# Patient Record
Sex: Female | Born: 1970 | Race: White | Hispanic: No | Marital: Married
Health system: Midwestern US, Community
[De-identification: ages and names within clinical notes are randomized; demographics above are authoritative.]

## PROBLEM LIST (undated history)

## (undated) DIAGNOSIS — F329 Major depressive disorder, single episode, unspecified: Secondary | ICD-10-CM

## (undated) DIAGNOSIS — R011 Cardiac murmur, unspecified: Secondary | ICD-10-CM

## (undated) DIAGNOSIS — R569 Unspecified convulsions: Secondary | ICD-10-CM

## (undated) DIAGNOSIS — F32A Depression, unspecified: Secondary | ICD-10-CM

## (undated) HISTORY — PX: TUBAL LIGATION: SHX77

## (undated) HISTORY — PX: KNEE SURGERY: SHX244

---

## 2000-10-26 ENCOUNTER — Emergency Department (HOSPITAL_COMMUNITY): Admission: EM | Admit: 2000-10-26 | Discharge: 2000-10-26 | Payer: Self-pay | Admitting: *Deleted

## 2000-11-11 ENCOUNTER — Emergency Department (HOSPITAL_COMMUNITY): Admission: EM | Admit: 2000-11-11 | Discharge: 2000-11-11 | Payer: Self-pay | Admitting: *Deleted

## 2000-11-13 ENCOUNTER — Emergency Department (HOSPITAL_COMMUNITY): Admission: EM | Admit: 2000-11-13 | Discharge: 2000-11-13 | Payer: Self-pay | Admitting: *Deleted

## 2001-02-21 ENCOUNTER — Emergency Department (HOSPITAL_COMMUNITY): Admission: EM | Admit: 2001-02-21 | Discharge: 2001-02-21 | Payer: Self-pay | Admitting: Emergency Medicine

## 2001-03-20 ENCOUNTER — Emergency Department (HOSPITAL_COMMUNITY): Admission: EM | Admit: 2001-03-20 | Discharge: 2001-03-21 | Payer: Self-pay | Admitting: Emergency Medicine

## 2001-03-24 ENCOUNTER — Emergency Department (HOSPITAL_COMMUNITY): Admission: EM | Admit: 2001-03-24 | Discharge: 2001-03-24 | Payer: Self-pay | Admitting: Emergency Medicine

## 2001-06-12 ENCOUNTER — Emergency Department (HOSPITAL_COMMUNITY): Admission: EM | Admit: 2001-06-12 | Discharge: 2001-06-12 | Payer: Self-pay | Admitting: *Deleted

## 2001-07-17 ENCOUNTER — Emergency Department (HOSPITAL_COMMUNITY): Admission: EM | Admit: 2001-07-17 | Discharge: 2001-07-17 | Payer: Self-pay | Admitting: Emergency Medicine

## 2001-07-27 ENCOUNTER — Emergency Department (HOSPITAL_COMMUNITY): Admission: EM | Admit: 2001-07-27 | Discharge: 2001-07-28 | Payer: Self-pay | Admitting: *Deleted

## 2001-07-28 ENCOUNTER — Encounter: Payer: Self-pay | Admitting: Emergency Medicine

## 2001-07-29 ENCOUNTER — Emergency Department (HOSPITAL_COMMUNITY): Admission: EM | Admit: 2001-07-29 | Discharge: 2001-07-29 | Payer: Self-pay | Admitting: Emergency Medicine

## 2001-11-05 ENCOUNTER — Ambulatory Visit (HOSPITAL_BASED_OUTPATIENT_CLINIC_OR_DEPARTMENT_OTHER): Admission: RE | Admit: 2001-11-05 | Discharge: 2001-11-05 | Payer: Self-pay | Admitting: Oral Surgery

## 2001-12-16 ENCOUNTER — Emergency Department (HOSPITAL_COMMUNITY): Admission: EM | Admit: 2001-12-16 | Discharge: 2001-12-16 | Payer: Self-pay | Admitting: Emergency Medicine

## 2002-01-31 ENCOUNTER — Encounter: Payer: Self-pay | Admitting: Internal Medicine

## 2002-01-31 ENCOUNTER — Ambulatory Visit (HOSPITAL_COMMUNITY): Admission: RE | Admit: 2002-01-31 | Discharge: 2002-01-31 | Payer: Self-pay | Admitting: Internal Medicine

## 2002-02-21 ENCOUNTER — Encounter: Payer: Self-pay | Admitting: Orthopaedic Surgery

## 2002-02-21 ENCOUNTER — Ambulatory Visit (HOSPITAL_COMMUNITY): Admission: RE | Admit: 2002-02-21 | Discharge: 2002-02-21 | Payer: Self-pay | Admitting: Orthopaedic Surgery

## 2002-03-18 ENCOUNTER — Ambulatory Visit (HOSPITAL_COMMUNITY): Admission: RE | Admit: 2002-03-18 | Discharge: 2002-03-18 | Payer: Self-pay | Admitting: Orthopaedic Surgery

## 2002-09-16 ENCOUNTER — Inpatient Hospital Stay (HOSPITAL_COMMUNITY): Admission: EM | Admit: 2002-09-16 | Discharge: 2002-10-01 | Payer: Self-pay | Admitting: Psychiatry

## 2002-09-28 ENCOUNTER — Emergency Department (HOSPITAL_COMMUNITY): Admission: EM | Admit: 2002-09-28 | Discharge: 2002-09-28 | Payer: Self-pay | Admitting: Emergency Medicine

## 2002-09-28 ENCOUNTER — Encounter: Payer: Self-pay | Admitting: Emergency Medicine

## 2002-10-21 ENCOUNTER — Inpatient Hospital Stay (HOSPITAL_COMMUNITY): Admission: EM | Admit: 2002-10-21 | Discharge: 2002-10-24 | Payer: Self-pay | Admitting: Psychiatry

## 2003-09-24 ENCOUNTER — Emergency Department (HOSPITAL_COMMUNITY): Admission: AD | Admit: 2003-09-24 | Discharge: 2003-09-24 | Payer: Self-pay | Admitting: Emergency Medicine

## 2004-03-12 ENCOUNTER — Emergency Department (HOSPITAL_COMMUNITY): Admission: EM | Admit: 2004-03-12 | Discharge: 2004-03-12 | Payer: Self-pay | Admitting: Emergency Medicine

## 2004-07-15 ENCOUNTER — Emergency Department (HOSPITAL_COMMUNITY): Admission: EM | Admit: 2004-07-15 | Discharge: 2004-07-16 | Payer: Self-pay | Admitting: *Deleted

## 2004-09-09 ENCOUNTER — Emergency Department (HOSPITAL_COMMUNITY): Admission: EM | Admit: 2004-09-09 | Discharge: 2004-09-10 | Payer: Self-pay | Admitting: Emergency Medicine

## 2004-12-20 ENCOUNTER — Emergency Department (HOSPITAL_COMMUNITY): Admission: EM | Admit: 2004-12-20 | Discharge: 2004-12-20 | Payer: Self-pay | Admitting: Emergency Medicine

## 2005-03-21 ENCOUNTER — Emergency Department (HOSPITAL_COMMUNITY): Admission: EM | Admit: 2005-03-21 | Discharge: 2005-03-21 | Payer: Self-pay | Admitting: Emergency Medicine

## 2005-04-08 ENCOUNTER — Inpatient Hospital Stay (HOSPITAL_COMMUNITY): Admission: EM | Admit: 2005-04-08 | Discharge: 2005-04-14 | Payer: Self-pay | Admitting: Psychiatry

## 2005-04-08 ENCOUNTER — Emergency Department (HOSPITAL_COMMUNITY): Admission: EM | Admit: 2005-04-08 | Discharge: 2005-04-08 | Payer: Self-pay | Admitting: Emergency Medicine

## 2005-04-17 ENCOUNTER — Inpatient Hospital Stay (HOSPITAL_COMMUNITY): Admission: RE | Admit: 2005-04-17 | Discharge: 2005-04-19 | Payer: Self-pay | Admitting: Psychiatry

## 2005-04-18 ENCOUNTER — Ambulatory Visit: Payer: Self-pay | Admitting: Psychiatry

## 2005-07-05 ENCOUNTER — Emergency Department (HOSPITAL_COMMUNITY): Admission: EM | Admit: 2005-07-05 | Discharge: 2005-07-05 | Payer: Self-pay | Admitting: Emergency Medicine

## 2005-08-15 ENCOUNTER — Ambulatory Visit (HOSPITAL_COMMUNITY): Admission: RE | Admit: 2005-08-15 | Discharge: 2005-08-15 | Payer: Self-pay | Admitting: Orthopaedic Surgery

## 2005-08-15 ENCOUNTER — Observation Stay (HOSPITAL_COMMUNITY): Admission: EM | Admit: 2005-08-15 | Discharge: 2005-08-18 | Payer: Self-pay | Admitting: Emergency Medicine

## 2005-08-16 ENCOUNTER — Ambulatory Visit: Payer: Self-pay | Admitting: Gastroenterology

## 2005-09-01 ENCOUNTER — Emergency Department (HOSPITAL_COMMUNITY): Admission: EM | Admit: 2005-09-01 | Discharge: 2005-09-01 | Payer: Self-pay | Admitting: Emergency Medicine

## 2005-09-01 ENCOUNTER — Ambulatory Visit: Payer: Self-pay | Admitting: Family Medicine

## 2005-09-08 ENCOUNTER — Ambulatory Visit: Payer: Self-pay | Admitting: Family Medicine

## 2005-09-09 ENCOUNTER — Emergency Department: Payer: Self-pay | Admitting: Emergency Medicine

## 2005-09-17 ENCOUNTER — Emergency Department (HOSPITAL_COMMUNITY): Admission: EM | Admit: 2005-09-17 | Discharge: 2005-09-17 | Payer: Self-pay | Admitting: Emergency Medicine

## 2005-09-21 ENCOUNTER — Ambulatory Visit: Payer: Self-pay | Admitting: Family Medicine

## 2005-09-21 ENCOUNTER — Emergency Department (HOSPITAL_COMMUNITY): Admission: EM | Admit: 2005-09-21 | Discharge: 2005-09-21 | Payer: Self-pay | Admitting: Emergency Medicine

## 2005-10-04 ENCOUNTER — Encounter (INDEPENDENT_AMBULATORY_CARE_PROVIDER_SITE_OTHER): Payer: Self-pay | Admitting: Specialist

## 2005-10-04 ENCOUNTER — Ambulatory Visit (HOSPITAL_COMMUNITY): Admission: RE | Admit: 2005-10-04 | Discharge: 2005-10-04 | Payer: Self-pay | Admitting: Urology

## 2005-10-05 ENCOUNTER — Ambulatory Visit (HOSPITAL_COMMUNITY): Admission: RE | Admit: 2005-10-05 | Discharge: 2005-10-05 | Payer: Self-pay | Admitting: Orthopaedic Surgery

## 2005-10-05 ENCOUNTER — Encounter (INDEPENDENT_AMBULATORY_CARE_PROVIDER_SITE_OTHER): Payer: Self-pay | Admitting: Specialist

## 2006-04-11 ENCOUNTER — Observation Stay (HOSPITAL_COMMUNITY): Admission: EM | Admit: 2006-04-11 | Discharge: 2006-04-13 | Payer: Self-pay | Admitting: Emergency Medicine

## 2006-04-14 ENCOUNTER — Emergency Department (HOSPITAL_COMMUNITY): Admission: EM | Admit: 2006-04-14 | Discharge: 2006-04-14 | Payer: Self-pay | Admitting: Emergency Medicine

## 2006-04-14 ENCOUNTER — Ambulatory Visit: Payer: Self-pay | Admitting: Psychiatry

## 2006-04-14 ENCOUNTER — Inpatient Hospital Stay (HOSPITAL_COMMUNITY): Admission: EM | Admit: 2006-04-14 | Discharge: 2006-04-18 | Payer: Self-pay | Admitting: Psychiatry

## 2006-04-14 ENCOUNTER — Inpatient Hospital Stay (HOSPITAL_COMMUNITY): Admission: AD | Admit: 2006-04-14 | Discharge: 2006-04-14 | Payer: Self-pay | Admitting: Obstetrics & Gynecology

## 2006-05-09 ENCOUNTER — Emergency Department (HOSPITAL_COMMUNITY): Admission: EM | Admit: 2006-05-09 | Discharge: 2006-05-10 | Payer: Self-pay | Admitting: Emergency Medicine

## 2006-07-07 ENCOUNTER — Emergency Department (HOSPITAL_COMMUNITY): Admission: EM | Admit: 2006-07-07 | Discharge: 2006-07-07 | Payer: Self-pay | Admitting: Emergency Medicine

## 2006-07-11 ENCOUNTER — Ambulatory Visit: Payer: Self-pay | Admitting: Cardiology

## 2006-07-17 ENCOUNTER — Ambulatory Visit: Payer: Self-pay | Admitting: Psychiatry

## 2006-07-17 ENCOUNTER — Inpatient Hospital Stay (HOSPITAL_COMMUNITY): Admission: AD | Admit: 2006-07-17 | Discharge: 2006-07-20 | Payer: Self-pay | Admitting: Psychiatry

## 2006-08-21 ENCOUNTER — Inpatient Hospital Stay (HOSPITAL_COMMUNITY): Admission: AD | Admit: 2006-08-21 | Discharge: 2006-08-25 | Payer: Self-pay | Admitting: Psychiatry

## 2006-08-26 ENCOUNTER — Inpatient Hospital Stay (HOSPITAL_COMMUNITY): Admission: EM | Admit: 2006-08-26 | Discharge: 2006-08-29 | Payer: Self-pay | Admitting: Emergency Medicine

## 2006-08-26 ENCOUNTER — Ambulatory Visit: Payer: Self-pay | Admitting: Cardiology

## 2006-08-26 ENCOUNTER — Ambulatory Visit: Payer: Self-pay | Admitting: Internal Medicine

## 2006-08-27 ENCOUNTER — Encounter: Payer: Self-pay | Admitting: Cardiology

## 2006-08-29 ENCOUNTER — Inpatient Hospital Stay (HOSPITAL_COMMUNITY): Admission: RE | Admit: 2006-08-29 | Discharge: 2006-08-30 | Payer: Self-pay | Admitting: Psychiatry

## 2006-08-29 ENCOUNTER — Ambulatory Visit: Payer: Self-pay | Admitting: Psychiatry

## 2007-07-28 ENCOUNTER — Emergency Department (HOSPITAL_COMMUNITY): Admission: EM | Admit: 2007-07-28 | Discharge: 2007-07-29 | Payer: Self-pay | Admitting: Emergency Medicine

## 2007-10-10 ENCOUNTER — Emergency Department (HOSPITAL_COMMUNITY): Admission: EM | Admit: 2007-10-10 | Discharge: 2007-10-10 | Payer: Self-pay | Admitting: Emergency Medicine

## 2007-11-23 ENCOUNTER — Emergency Department (HOSPITAL_COMMUNITY): Admission: EM | Admit: 2007-11-23 | Discharge: 2007-11-24 | Payer: Self-pay | Admitting: Emergency Medicine

## 2007-11-24 ENCOUNTER — Emergency Department (HOSPITAL_COMMUNITY): Admission: EM | Admit: 2007-11-24 | Discharge: 2007-11-24 | Payer: Self-pay | Admitting: Emergency Medicine

## 2007-12-04 ENCOUNTER — Emergency Department (HOSPITAL_COMMUNITY): Admission: EM | Admit: 2007-12-04 | Discharge: 2007-12-04 | Payer: Self-pay | Admitting: Emergency Medicine

## 2007-12-19 ENCOUNTER — Emergency Department (HOSPITAL_COMMUNITY): Admission: EM | Admit: 2007-12-19 | Discharge: 2007-12-20 | Payer: Self-pay | Admitting: Emergency Medicine

## 2008-03-20 ENCOUNTER — Inpatient Hospital Stay (HOSPITAL_COMMUNITY): Admission: EM | Admit: 2008-03-20 | Discharge: 2008-03-27 | Payer: Self-pay | Admitting: Emergency Medicine

## 2009-02-20 IMAGING — CT CT CHEST W/ CM
1 series · 16 of 32 positions shown, 20 images · IV contrast (Omnipaque 300)
Comparison: Chest radiograph 08/28/2006.

CLINICAL DATA: Hemoptysis

CT CHEST WITH CONTRAST
TECHNIQUE: Multidetector CT imaging of the chest was performed
following the standard protocol during bolus administration of
intravenous contrast.
Contrast: Chest radiograph 08/28/2006

[Series 2: chestroutine 5.0 b40f · axial · 0.58mm/px · z∈[-416,-90]mm · 16 of 71 slices shown, 20 images]
[im 3/71  mediastinal]
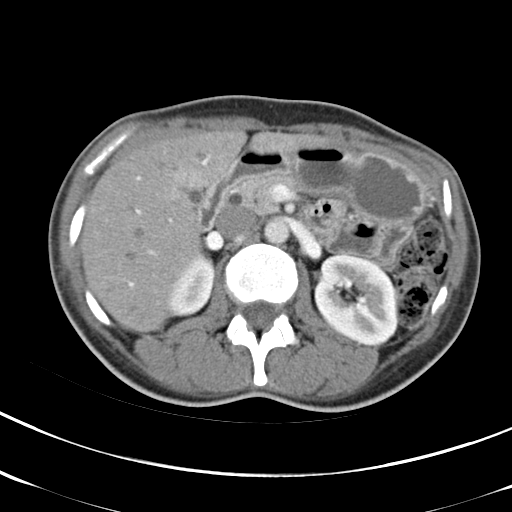
[im 3/71  lung]
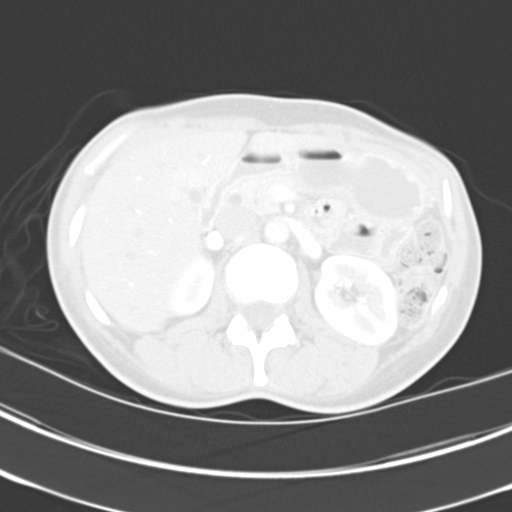
[im 8/71  lung]
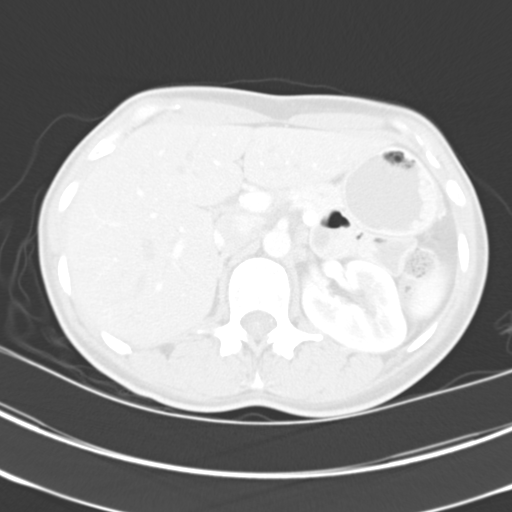
[im 13/71  lung]
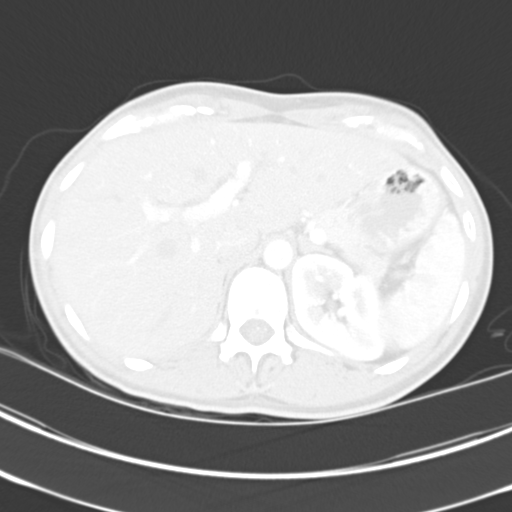
[im 16/71  lung]
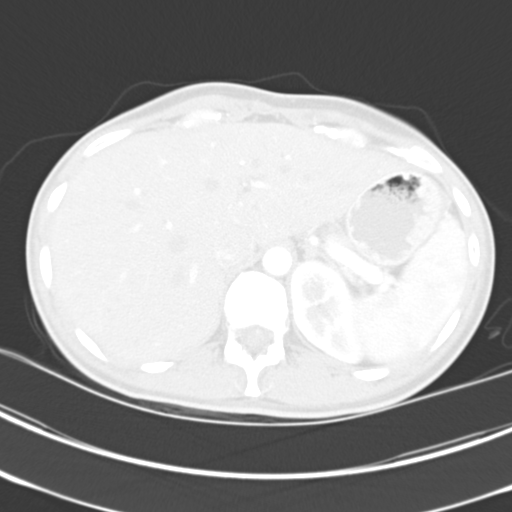
[im 21/71  mediastinal]
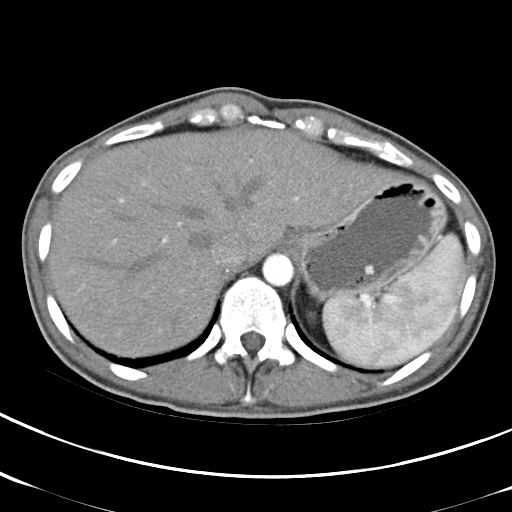
[im 21/71  lung]
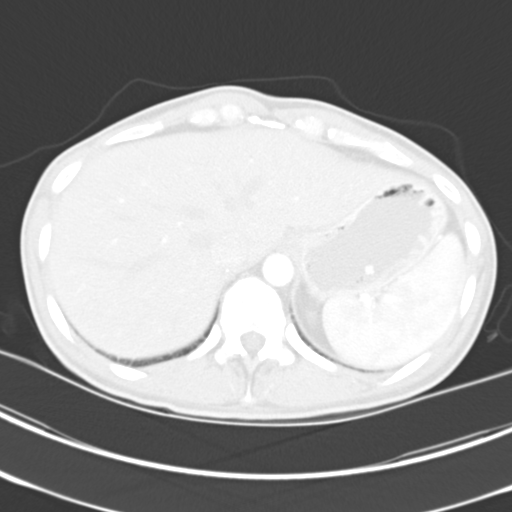
[im 26/71  lung]
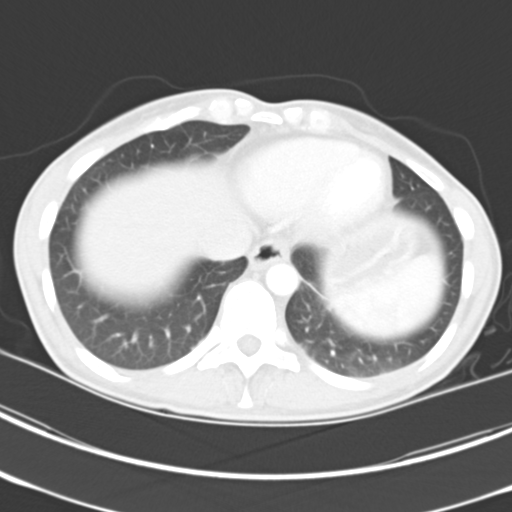
[im 32/71  lung]
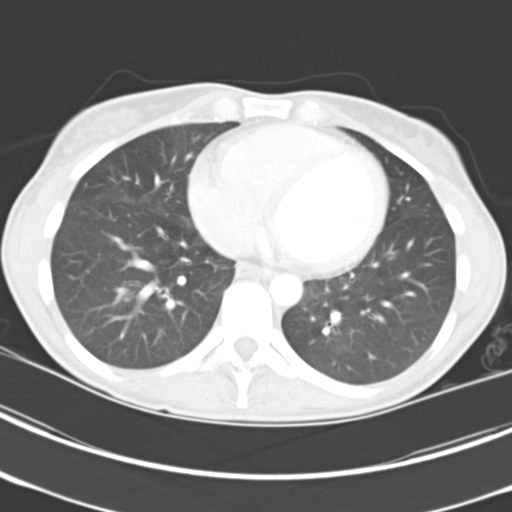
[im 37/71  lung]
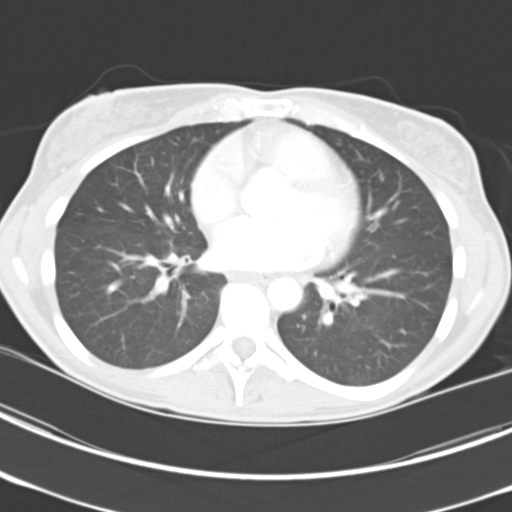
[im 38/71  mediastinal]
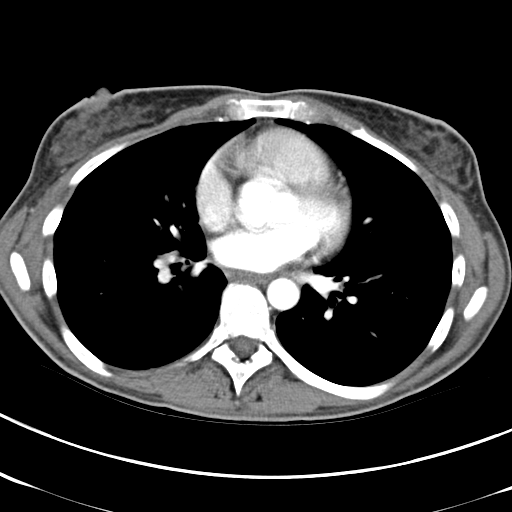
[im 38/71  lung]
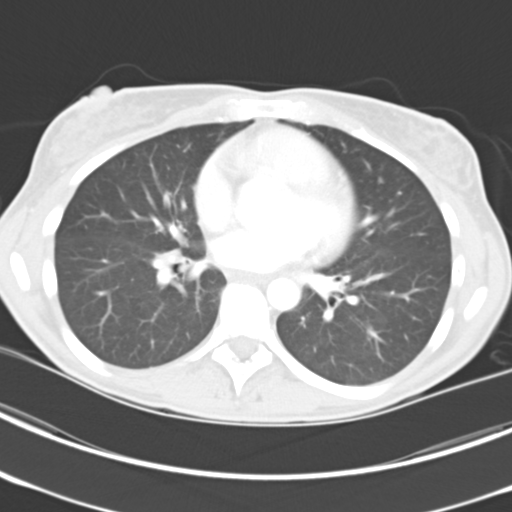
[im 42/71  lung]
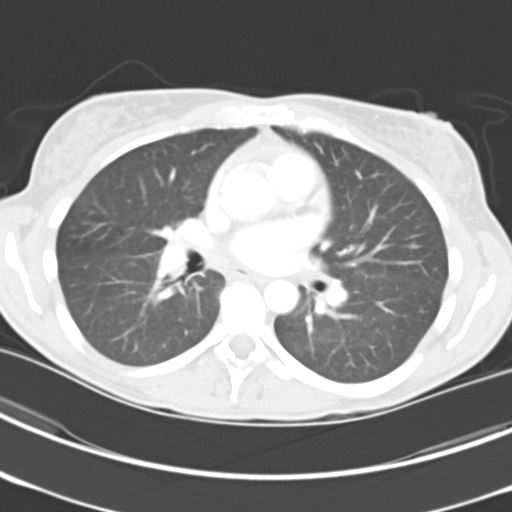
[im 45/71  lung]
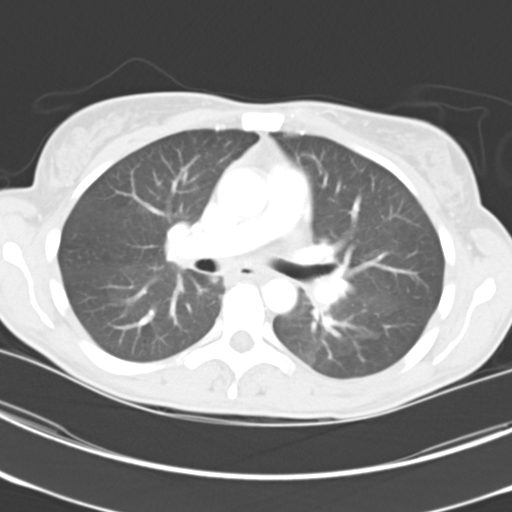
[im 50/71  lung]
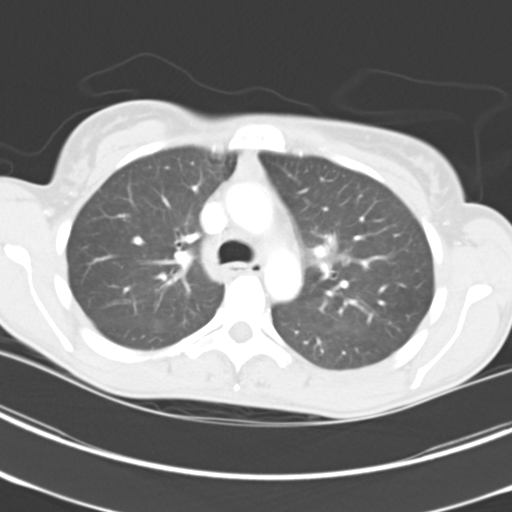
[im 55/71  mediastinal]
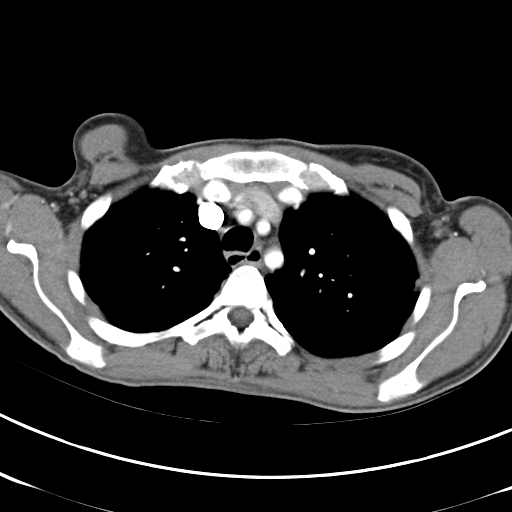
[im 55/71  lung]
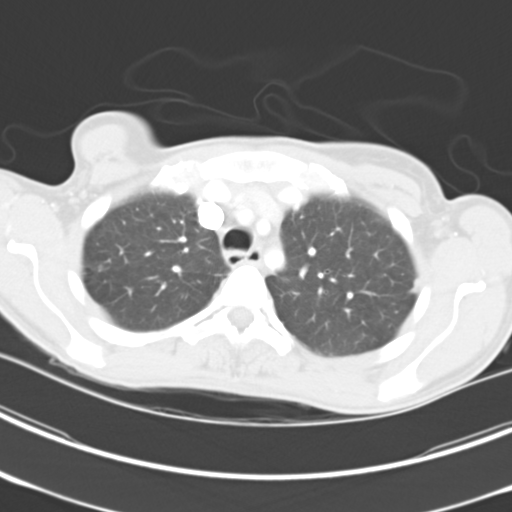
[im 58/71  lung]
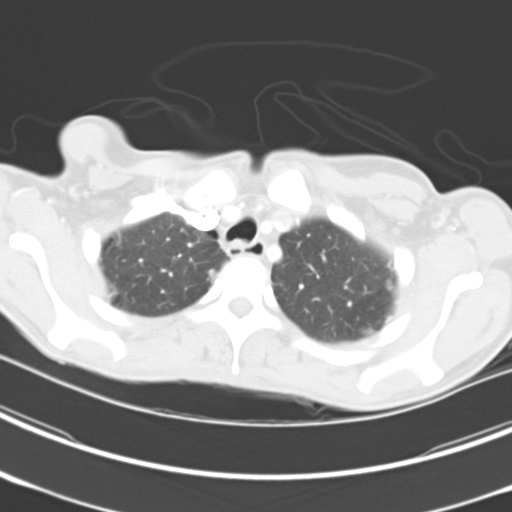
[im 63/71  lung]
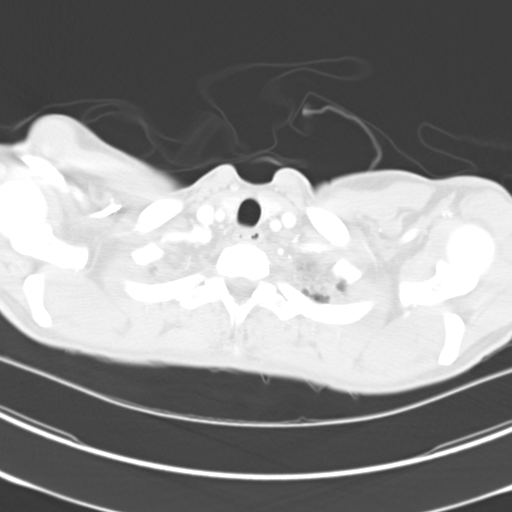
[im 68/71  lung]
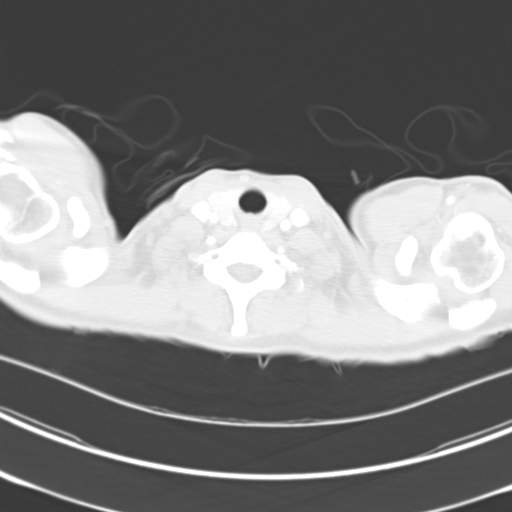

[16 of 32 positions shown; findings below may reference images not displayed]

FINDINGS: Biapical pleural parenchymal thickening.  Peribronchial
thickening within the lateral segment right middle lobe and
laterally within the basal segment of the right lower lobe.  There
is dependent basilar subsegmental cyst left lung base.  Negative
for focal infiltrates.  No pulmonary nodules are seen.  The
mediastinum and hilar negative adenopathy.

The pulmonary vasculature opacifies normally without filling
defects or truncation to suggest embolus.  Thoracic aorta is
normal.  The upper abdomen is unremarkable.
IMPRESSION: Bronchial inflammatory changes identified within the lateral
segment right middle lobe and within the lateral basal segment
right lower lobe.  Negative for focal infiltrates or effusions.

Negative for pulmonary embolus.

## 2009-08-12 ENCOUNTER — Emergency Department (HOSPITAL_COMMUNITY): Admission: EM | Admit: 2009-08-12 | Discharge: 2009-08-12 | Payer: Self-pay | Admitting: Emergency Medicine

## 2009-08-22 ENCOUNTER — Emergency Department (HOSPITAL_COMMUNITY): Admission: EM | Admit: 2009-08-22 | Discharge: 2009-08-22 | Payer: Self-pay | Admitting: Emergency Medicine

## 2009-09-23 ENCOUNTER — Emergency Department (HOSPITAL_COMMUNITY): Admission: EM | Admit: 2009-09-23 | Discharge: 2009-09-23 | Payer: Self-pay | Admitting: Emergency Medicine

## 2009-10-28 ENCOUNTER — Emergency Department (HOSPITAL_COMMUNITY): Admission: EM | Admit: 2009-10-28 | Discharge: 2009-10-28 | Payer: Self-pay | Admitting: Emergency Medicine

## 2010-02-09 ENCOUNTER — Emergency Department (HOSPITAL_COMMUNITY): Admission: EM | Admit: 2010-02-09 | Discharge: 2010-02-09 | Payer: Self-pay | Admitting: Emergency Medicine

## 2010-02-17 ENCOUNTER — Emergency Department (HOSPITAL_COMMUNITY): Admission: EM | Admit: 2010-02-17 | Discharge: 2010-02-17 | Payer: Self-pay | Admitting: Emergency Medicine

## 2010-02-21 ENCOUNTER — Emergency Department (HOSPITAL_COMMUNITY): Admission: EM | Admit: 2010-02-21 | Discharge: 2010-02-21 | Payer: Self-pay | Admitting: Emergency Medicine

## 2010-03-16 ENCOUNTER — Emergency Department (HOSPITAL_COMMUNITY): Admission: EM | Admit: 2010-03-16 | Discharge: 2010-03-16 | Payer: Self-pay | Admitting: Emergency Medicine

## 2010-03-29 ENCOUNTER — Emergency Department (HOSPITAL_COMMUNITY): Admission: EM | Admit: 2010-03-29 | Discharge: 2010-03-29 | Payer: Self-pay | Admitting: Emergency Medicine

## 2010-04-04 ENCOUNTER — Emergency Department (HOSPITAL_COMMUNITY): Admission: EM | Admit: 2010-04-04 | Discharge: 2010-04-04 | Payer: Self-pay | Admitting: Emergency Medicine

## 2010-04-09 ENCOUNTER — Emergency Department (HOSPITAL_COMMUNITY): Admission: EM | Admit: 2010-04-09 | Discharge: 2010-04-09 | Payer: Self-pay | Admitting: Emergency Medicine

## 2010-04-12 ENCOUNTER — Emergency Department (HOSPITAL_COMMUNITY): Admission: EM | Admit: 2010-04-12 | Discharge: 2010-04-12 | Payer: Self-pay | Admitting: Emergency Medicine

## 2010-04-14 ENCOUNTER — Emergency Department (HOSPITAL_COMMUNITY): Admission: EM | Admit: 2010-04-14 | Discharge: 2010-04-14 | Payer: Self-pay | Admitting: Emergency Medicine

## 2010-04-21 ENCOUNTER — Emergency Department (HOSPITAL_COMMUNITY): Admission: EM | Admit: 2010-04-21 | Discharge: 2010-04-22 | Payer: Self-pay | Admitting: Emergency Medicine

## 2010-05-22 ENCOUNTER — Emergency Department (HOSPITAL_COMMUNITY): Admission: EM | Admit: 2010-05-22 | Discharge: 2010-05-23 | Payer: Self-pay | Admitting: Emergency Medicine

## 2010-05-23 ENCOUNTER — Emergency Department (HOSPITAL_COMMUNITY): Admission: EM | Admit: 2010-05-23 | Discharge: 2010-05-23 | Payer: Self-pay | Admitting: Emergency Medicine

## 2010-05-24 ENCOUNTER — Emergency Department (HOSPITAL_COMMUNITY)
Admission: EM | Admit: 2010-05-24 | Discharge: 2010-05-24 | Payer: Self-pay | Source: Home / Self Care | Admitting: Emergency Medicine

## 2010-05-25 ENCOUNTER — Inpatient Hospital Stay (HOSPITAL_COMMUNITY)
Admission: AD | Admit: 2010-05-25 | Discharge: 2010-05-30 | Payer: Self-pay | Source: Home / Self Care | Admitting: Psychiatry

## 2010-05-25 ENCOUNTER — Ambulatory Visit: Payer: Self-pay | Admitting: Psychiatry

## 2010-05-25 ENCOUNTER — Emergency Department (HOSPITAL_COMMUNITY)
Admission: EM | Admit: 2010-05-25 | Discharge: 2010-05-25 | Disposition: A | Discharge status: Other Acute Inpatient Hospital | Payer: Self-pay | Source: Home / Self Care | Admitting: Emergency Medicine

## 2010-06-02 ENCOUNTER — Emergency Department (HOSPITAL_COMMUNITY): Admission: EM | Admit: 2010-06-02 | Discharge: 2010-05-24 | Payer: Self-pay | Admitting: Emergency Medicine

## 2010-07-16 ENCOUNTER — Encounter: Payer: Self-pay | Admitting: Neurology

## 2010-07-17 ENCOUNTER — Encounter: Payer: Self-pay | Admitting: Neurology

## 2010-07-17 ENCOUNTER — Encounter: Payer: Self-pay | Admitting: Internal Medicine

## 2010-07-17 ENCOUNTER — Encounter: Payer: Self-pay | Admitting: Emergency Medicine

## 2010-07-26 ENCOUNTER — Emergency Department (HOSPITAL_COMMUNITY)
Admission: EM | Admit: 2010-07-26 | Discharge: 2010-07-27 | Discharge status: Against Medical Advice | Payer: Self-pay | Attending: Emergency Medicine | Admitting: Emergency Medicine

## 2010-07-26 DIAGNOSIS — G43909 Migraine, unspecified, not intractable, without status migrainosus: Secondary | ICD-10-CM | POA: Insufficient documentation

## 2010-08-15 DIAGNOSIS — J40 Bronchitis, not specified as acute or chronic: Secondary | ICD-10-CM | POA: Insufficient documentation

## 2010-08-15 DIAGNOSIS — R05 Cough: Secondary | ICD-10-CM | POA: Insufficient documentation

## 2010-08-15 DIAGNOSIS — R059 Cough, unspecified: Secondary | ICD-10-CM | POA: Insufficient documentation

## 2010-08-16 ENCOUNTER — Emergency Department (HOSPITAL_COMMUNITY): Payer: Self-pay

## 2010-08-16 ENCOUNTER — Emergency Department (HOSPITAL_COMMUNITY)
Admission: EM | Admit: 2010-08-16 | Discharge: 2010-08-16 | Disposition: A | Discharge status: Home / Self Care | Payer: Self-pay | Attending: Emergency Medicine | Admitting: Emergency Medicine

## 2010-08-27 ENCOUNTER — Inpatient Hospital Stay (HOSPITAL_COMMUNITY)
Admission: RE | Admit: 2010-08-27 | Discharge: 2010-09-02 | DRG: 885 | Disposition: A | Discharge status: Home / Self Care | Payer: 59 | Source: Ambulatory Visit | Attending: Psychiatry | Admitting: Psychiatry

## 2010-08-27 ENCOUNTER — Emergency Department (HOSPITAL_COMMUNITY)
Admission: EM | Admit: 2010-08-27 | Discharge: 2010-08-27 | Disposition: A | Discharge status: Other Acute Inpatient Hospital | Payer: Self-pay | Attending: Emergency Medicine | Admitting: Emergency Medicine

## 2010-08-27 DIAGNOSIS — G8929 Other chronic pain: Secondary | ICD-10-CM | POA: Insufficient documentation

## 2010-08-27 DIAGNOSIS — M549 Dorsalgia, unspecified: Secondary | ICD-10-CM

## 2010-08-27 DIAGNOSIS — F329 Major depressive disorder, single episode, unspecified: Secondary | ICD-10-CM | POA: Insufficient documentation

## 2010-08-27 DIAGNOSIS — R51 Headache: Secondary | ICD-10-CM | POA: Insufficient documentation

## 2010-08-27 DIAGNOSIS — G40919 Epilepsy, unspecified, intractable, without status epilepticus: Secondary | ICD-10-CM | POA: Insufficient documentation

## 2010-08-27 DIAGNOSIS — Z880 Allergy status to penicillin: Secondary | ICD-10-CM

## 2010-08-27 DIAGNOSIS — F39 Unspecified mood [affective] disorder: Principal | ICD-10-CM

## 2010-08-27 DIAGNOSIS — F1920 Other psychoactive substance dependence, uncomplicated: Secondary | ICD-10-CM

## 2010-08-27 DIAGNOSIS — F3289 Other specified depressive episodes: Secondary | ICD-10-CM | POA: Insufficient documentation

## 2010-08-27 DIAGNOSIS — F4310 Post-traumatic stress disorder, unspecified: Secondary | ICD-10-CM | POA: Insufficient documentation

## 2010-08-27 DIAGNOSIS — R45851 Suicidal ideations: Secondary | ICD-10-CM | POA: Insufficient documentation

## 2010-08-27 DIAGNOSIS — F319 Bipolar disorder, unspecified: Secondary | ICD-10-CM

## 2010-08-27 DIAGNOSIS — F29 Unspecified psychosis not due to a substance or known physiological condition: Secondary | ICD-10-CM

## 2010-08-27 LAB — BASIC METABOLIC PANEL
Chloride: 103 mEq/L (ref 96–112)
GFR calc Af Amer: >60 mL/min (ref >60)
GFR calc non Af Amer: >60 mL/min (ref >60)
Potassium: 3.4 mEq/L — ABNORMAL LOW (ref 3.5–5.1)
Sodium: 139 mEq/L (ref 135–145)

## 2010-08-27 LAB — DIFFERENTIAL
Basophils Relative: 0 % (ref 0–1)
Eosinophils Absolute: 0 10*3/uL (ref 0.0–0.7)
Neutro Abs: 4.3 10*3/uL (ref 1.7–7.7)
Neutrophils Relative %: 66 % (ref 43–77)

## 2010-08-27 LAB — ETHANOL: Alcohol, Ethyl (B): <5 mg/dL (ref 0–10)

## 2010-08-27 LAB — CBC
Hemoglobin: 13.8 g/dL (ref 12.0–15.0)
Platelets: 300 10*3/uL (ref 150–400)
RBC: 4.56 MIL/uL (ref 3.87–5.11)
WBC: 6.5 10*3/uL (ref 4.0–10.5)

## 2010-08-27 LAB — RAPID URINE DRUG SCREEN, HOSP PERFORMED
Benzodiazepines: POSITIVE — AB
Cocaine: NOT DETECTED
Tetrahydrocannabinol: POSITIVE — AB

## 2010-08-27 LAB — POCT PREGNANCY, URINE: Preg Test, Ur: NEGATIVE

## 2010-08-28 DIAGNOSIS — F39 Unspecified mood [affective] disorder: Secondary | ICD-10-CM

## 2010-08-28 DIAGNOSIS — F603 Borderline personality disorder: Secondary | ICD-10-CM

## 2010-08-28 DIAGNOSIS — F1920 Other psychoactive substance dependence, uncomplicated: Secondary | ICD-10-CM

## 2010-08-31 LAB — COMPREHENSIVE METABOLIC PANEL
ALT: 22 U/L (ref 0–35)
AST: 27 U/L (ref 0–37)
Albumin: 3.4 g/dL — ABNORMAL LOW (ref 3.5–5.2)
Alkaline Phosphatase: 55 U/L (ref 39–117)
Chloride: 99 mEq/L (ref 96–112)
Creatinine, Ser: 0.81 mg/dL (ref 0.4–1.2)
GFR calc Af Amer: >60 mL/min (ref >60)
Potassium: 4.7 mEq/L (ref 3.5–5.1)
Sodium: 136 mEq/L (ref 135–145)
Total Bilirubin: 0.5 mg/dL (ref 0.3–1.2)
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
## 2010-09-05 LAB — HEPATIC FUNCTION PANEL
Albumin: 3.4 g/dL — ABNORMAL LOW (ref 3.5–5.2)
Alkaline Phosphatase: 80 U/L (ref 39–117)
Bilirubin, Direct: 0.1 mg/dL (ref 0.0–0.3)
Total Bilirubin: 0.2 mg/dL — ABNORMAL LOW (ref 0.3–1.2)

## 2010-09-05 LAB — TSH: TSH: 0.508 u[IU]/mL (ref 0.350–4.500)

## 2010-09-06 ENCOUNTER — Emergency Department (HOSPITAL_COMMUNITY)
Admission: EM | Admit: 2010-09-06 | Discharge: 2010-09-06 | Disposition: A | Discharge status: Other Acute Inpatient Hospital | Payer: 59 | Attending: Emergency Medicine | Admitting: Emergency Medicine

## 2010-09-06 DIAGNOSIS — F4310 Post-traumatic stress disorder, unspecified: Secondary | ICD-10-CM | POA: Insufficient documentation

## 2010-09-06 DIAGNOSIS — Z79899 Other long term (current) drug therapy: Secondary | ICD-10-CM | POA: Insufficient documentation

## 2010-09-06 DIAGNOSIS — F411 Generalized anxiety disorder: Secondary | ICD-10-CM | POA: Insufficient documentation

## 2010-09-06 DIAGNOSIS — F3289 Other specified depressive episodes: Secondary | ICD-10-CM | POA: Insufficient documentation

## 2010-09-06 DIAGNOSIS — F209 Schizophrenia, unspecified: Secondary | ICD-10-CM | POA: Insufficient documentation

## 2010-09-06 DIAGNOSIS — F329 Major depressive disorder, single episode, unspecified: Secondary | ICD-10-CM | POA: Insufficient documentation

## 2010-09-06 DIAGNOSIS — R45851 Suicidal ideations: Secondary | ICD-10-CM | POA: Insufficient documentation

## 2010-09-06 DIAGNOSIS — M549 Dorsalgia, unspecified: Secondary | ICD-10-CM | POA: Insufficient documentation

## 2010-09-06 DIAGNOSIS — G8929 Other chronic pain: Secondary | ICD-10-CM | POA: Insufficient documentation

## 2010-09-06 LAB — PREGNANCY, URINE: Preg Test, Ur: NEGATIVE

## 2010-09-06 LAB — DIFFERENTIAL
Basophils Absolute: 0 10*3/uL (ref 0.0–0.1)
Basophils Relative: 0 % (ref 0–1)
Eosinophils Absolute: 0.1 10*3/uL (ref 0.0–0.7)
Eosinophils Absolute: 0.1 10*3/uL (ref 0.0–0.7)
Eosinophils Relative: 1 % (ref 0–5)
Eosinophils Relative: 1 % (ref 0–5)
Lymphocytes Relative: 19 % (ref 12–46)
Lymphs Abs: 1.9 10*3/uL (ref 0.7–4.0)
Lymphs Abs: 1.9 10*3/uL (ref 0.7–4.0)
Lymphs Abs: 2 10*3/uL (ref 0.7–4.0)
Neutro Abs: 8.2 10*3/uL — ABNORMAL HIGH (ref 1.7–7.7)
Neutrophils Relative %: 72 % (ref 43–77)

## 2010-09-06 LAB — COMPREHENSIVE METABOLIC PANEL
ALT: 13 U/L (ref 0–35)
Albumin: 3.7 g/dL (ref 3.5–5.2)
BUN: 17 mg/dL (ref 6–23)
CO2: 29 mEq/L (ref 19–32)
Calcium: 9 mg/dL (ref 8.4–10.5)
Chloride: 104 mEq/L (ref 96–112)
Creatinine, Ser: 0.8 mg/dL (ref 0.4–1.2)
GFR calc non Af Amer: >60 mL/min (ref >60)
Glucose, Bld: 94 mg/dL (ref 70–99)
Sodium: 141 mEq/L (ref 135–145)
Total Bilirubin: 0.4 mg/dL (ref 0.3–1.2)
Total Protein: 6.5 g/dL (ref 6.0–8.3)

## 2010-09-06 LAB — CBC
HCT: 41.9 % (ref 36.0–46.0)
Hemoglobin: 14.8 g/dL (ref 12.0–15.0)
MCH: 30.4 pg (ref 26.0–34.0)
MCH: 31.9 pg (ref 26.0–34.0)
MCHC: 35.3 g/dL (ref 30.0–36.0)
MCV: 91.1 fL (ref 78.0–100.0)
MCV: 87.5 fL (ref 78.0–100.0)
Platelets: 259 10*3/uL (ref 150–400)
Platelets: 240 10*3/uL (ref 150–400)
RBC: 4.56 MIL/uL (ref 3.87–5.11)
RDW: 13.7 % (ref 11.5–15.5)
WBC: 10.8 10*3/uL — ABNORMAL HIGH (ref 4.0–10.5)
WBC: 10.8 10*3/uL — ABNORMAL HIGH (ref 4.0–10.5)

## 2010-09-06 LAB — URINALYSIS, ROUTINE W REFLEX MICROSCOPIC
Ketones, ur: NEGATIVE mg/dL
Nitrite: NEGATIVE
Nitrite: NEGATIVE
Protein, ur: NEGATIVE mg/dL
Specific Gravity, Urine: 1.005 (ref 1.005–1.030)
Urobilinogen, UA: 0.2 mg/dL (ref 0.0–1.0)
pH: 5 (ref 5.0–8.0)
pH: 5.5 (ref 5.0–8.0)

## 2010-09-06 LAB — BASIC METABOLIC PANEL
Chloride: 102 mEq/L (ref 96–112)
Creatinine, Ser: 0.7 mg/dL (ref 0.4–1.2)
GFR calc Af Amer: >60 mL/min (ref >60)
GFR calc non Af Amer: >60 mL/min (ref >60)
Potassium: 3.6 mEq/L (ref 3.5–5.1)

## 2010-09-06 LAB — RAPID URINE DRUG SCREEN, HOSP PERFORMED
Amphetamines: NOT DETECTED
Barbiturates: NOT DETECTED
Opiates: POSITIVE — AB
Tetrahydrocannabinol: NOT DETECTED
Tetrahydrocannabinol: NOT DETECTED

## 2010-09-06 LAB — ETHANOL: Alcohol, Ethyl (B): <5 mg/dL (ref 0–10)

## 2010-09-06 LAB — LIPASE, BLOOD: Lipase: 25 U/L (ref 11–59)

## 2010-09-06 LAB — POCT PREGNANCY, URINE: Preg Test, Ur: NEGATIVE

## 2010-09-07 LAB — CBC
HCT: 42.2 % (ref 36.0–46.0)
HCT: 43.9 % (ref 36.0–46.0)
Hemoglobin: 14.4 g/dL (ref 12.0–15.0)
Hemoglobin: 14.8 g/dL (ref 12.0–15.0)
MCV: 92.3 fL (ref 78.0–100.0)
RBC: 4.57 MIL/uL (ref 3.87–5.11)
RBC: 4.72 MIL/uL (ref 3.87–5.11)
RDW: 13 % (ref 11.5–15.5)
WBC: 6.9 10*3/uL (ref 4.0–10.5)

## 2010-09-07 LAB — URINE CULTURE
Colony Count: >=100000
Colony Count: NO GROWTH
Culture  Setup Time: 201110121105
Culture  Setup Time: 201110282056

## 2010-09-07 LAB — URINALYSIS, ROUTINE W REFLEX MICROSCOPIC
Bilirubin Urine: NEGATIVE
Bilirubin Urine: NEGATIVE
Hgb urine dipstick: NEGATIVE
Ketones, ur: NEGATIVE mg/dL
Ketones, ur: NEGATIVE mg/dL
Nitrite: NEGATIVE
Nitrite: POSITIVE — AB
Protein, ur: NEGATIVE mg/dL
Specific Gravity, Urine: 1.025 (ref 1.005–1.030)
Urobilinogen, UA: 0.2 mg/dL (ref 0.0–1.0)
pH: 5.5 (ref 5.0–8.0)
pH: 6 (ref 5.0–8.0)

## 2010-09-07 LAB — COMPREHENSIVE METABOLIC PANEL
ALT: 10 U/L (ref 0–35)
ALT: 12 U/L (ref 0–35)
AST: 21 U/L (ref 0–37)
Alkaline Phosphatase: 52 U/L (ref 39–117)
Alkaline Phosphatase: 59 U/L (ref 39–117)
BUN: 11 mg/dL (ref 6–23)
CO2: 27 mEq/L (ref 19–32)
CO2: 29 mEq/L (ref 19–32)
Chloride: 101 mEq/L (ref 96–112)
Chloride: 106 mEq/L (ref 96–112)
GFR calc Af Amer: >60 mL/min (ref >60)
GFR calc non Af Amer: >60 mL/min (ref >60)
Glucose, Bld: 69 mg/dL — ABNORMAL LOW (ref 70–99)
Glucose, Bld: 77 mg/dL (ref 70–99)
Potassium: 3.3 mEq/L — ABNORMAL LOW (ref 3.5–5.1)
Potassium: 3.9 mEq/L (ref 3.5–5.1)
Sodium: 141 mEq/L (ref 135–145)
Sodium: 141 mEq/L (ref 135–145)
Total Bilirubin: 0.2 mg/dL — ABNORMAL LOW (ref 0.3–1.2)
Total Bilirubin: 0.7 mg/dL (ref 0.3–1.2)

## 2010-09-07 LAB — ETHANOL: Alcohol, Ethyl (B): 102 mg/dL — ABNORMAL HIGH (ref 0–10)

## 2010-09-07 LAB — DIFFERENTIAL
Basophils Absolute: 0.1 10*3/uL (ref 0.0–0.1)
Basophils Relative: 1 % (ref 0–1)
Basophils Relative: 1 % (ref 0–1)
Eosinophils Absolute: 0.1 10*3/uL (ref 0.0–0.7)
Eosinophils Absolute: 0.1 10*3/uL (ref 0.0–0.7)
Eosinophils Relative: 1 % (ref 0–5)
Lymphs Abs: 2.4 10*3/uL (ref 0.7–4.0)
Neutro Abs: 3.4 10*3/uL (ref 1.7–7.7)
Neutrophils Relative %: 49 % (ref 43–77)
Neutrophils Relative %: 71 % (ref 43–77)

## 2010-09-07 LAB — POCT CARDIAC MARKERS: Troponin i, poc: <0.05 ng/mL (ref 0.00–0.09)

## 2010-09-07 LAB — PREGNANCY, URINE: Preg Test, Ur: NEGATIVE

## 2010-09-07 LAB — LIPASE, BLOOD: Lipase: 29 U/L (ref 11–59)

## 2010-09-07 LAB — VALPROIC ACID LEVEL: Valproic Acid Lvl: <10 ug/mL — ABNORMAL LOW (ref 50.0–100.0)

## 2010-09-07 LAB — URINE MICROSCOPIC-ADD ON

## 2010-09-08 LAB — DIFFERENTIAL
Basophils Absolute: 0 10*3/uL (ref 0.0–0.1)
Lymphocytes Relative: 11 % — ABNORMAL LOW (ref 12–46)
Neutro Abs: 9.9 10*3/uL — ABNORMAL HIGH (ref 1.7–7.7)

## 2010-09-08 LAB — CBC
HCT: 39.4 % (ref 36.0–46.0)
Platelets: 279 10*3/uL (ref 150–400)
RBC: 4.41 MIL/uL (ref 3.87–5.11)
RDW: 12.9 % (ref 11.5–15.5)
WBC: 11.8 10*3/uL — ABNORMAL HIGH (ref 4.0–10.5)

## 2010-09-08 LAB — URINALYSIS, ROUTINE W REFLEX MICROSCOPIC
Bilirubin Urine: NEGATIVE
Nitrite: NEGATIVE
Specific Gravity, Urine: 1.021 (ref 1.005–1.030)
Urobilinogen, UA: 1 mg/dL (ref 0.0–1.0)

## 2010-09-08 LAB — URINE MICROSCOPIC-ADD ON

## 2010-09-08 LAB — POCT I-STAT, CHEM 8
HCT: 42 % (ref 36.0–46.0)
Hemoglobin: 14.3 g/dL (ref 12.0–15.0)
Potassium: 3.1 mEq/L — ABNORMAL LOW (ref 3.5–5.1)
Sodium: 139 mEq/L (ref 135–145)

## 2010-09-08 LAB — VALPROIC ACID LEVEL: Valproic Acid Lvl: <10 ug/mL — ABNORMAL LOW (ref 50.0–100.0)

## 2010-09-13 LAB — CBC
HCT: 41.3 % (ref 36.0–46.0)
Hemoglobin: 14.6 g/dL (ref 12.0–15.0)
MCV: 90.4 fL (ref 78.0–100.0)
RBC: 4.57 MIL/uL (ref 3.87–5.11)
WBC: 8 10*3/uL (ref 4.0–10.5)

## 2010-09-13 LAB — POCT CARDIAC MARKERS
CKMB, poc: <1 ng/mL — ABNORMAL LOW (ref 1.0–8.0)
Myoglobin, poc: 36.3 ng/mL (ref 12–200)

## 2010-09-13 LAB — URINALYSIS, ROUTINE W REFLEX MICROSCOPIC
Bilirubin Urine: NEGATIVE
Nitrite: POSITIVE — AB
Specific Gravity, Urine: 1.02 (ref 1.005–1.030)
pH: 6 (ref 5.0–8.0)

## 2010-09-13 LAB — DIFFERENTIAL
Eosinophils Absolute: 0.1 10*3/uL (ref 0.0–0.7)
Eosinophils Relative: 1 % (ref 0–5)
Lymphocytes Relative: 31 % (ref 12–46)
Lymphs Abs: 2.5 10*3/uL (ref 0.7–4.0)
Monocytes Relative: 4 % (ref 3–12)

## 2010-09-13 LAB — URINE CULTURE

## 2010-09-13 LAB — BASIC METABOLIC PANEL
Chloride: 106 mEq/L (ref 96–112)
GFR calc Af Amer: >60 mL/min (ref >60)
GFR calc non Af Amer: >60 mL/min (ref >60)
Potassium: 3.3 mEq/L — ABNORMAL LOW (ref 3.5–5.1)
Sodium: 139 mEq/L (ref 135–145)

## 2010-09-13 LAB — RAPID URINE DRUG SCREEN, HOSP PERFORMED
Opiates: NOT DETECTED
Tetrahydrocannabinol: POSITIVE — AB

## 2010-09-13 LAB — URINE MICROSCOPIC-ADD ON

## 2010-09-25 ENCOUNTER — Emergency Department (HOSPITAL_COMMUNITY)
Admission: EM | Admit: 2010-09-25 | Discharge: 2010-09-25 | Disposition: A | Discharge status: Home / Self Care | Payer: Self-pay | Attending: Emergency Medicine | Admitting: Emergency Medicine

## 2010-09-25 DIAGNOSIS — G43909 Migraine, unspecified, not intractable, without status migrainosus: Secondary | ICD-10-CM | POA: Insufficient documentation

## 2010-10-02 ENCOUNTER — Emergency Department (HOSPITAL_COMMUNITY)
Admission: EM | Admit: 2010-10-02 | Discharge: 2010-10-02 | Disposition: A | Discharge status: Home / Self Care | Payer: Self-pay | Attending: Emergency Medicine | Admitting: Emergency Medicine

## 2010-10-02 DIAGNOSIS — Z79899 Other long term (current) drug therapy: Secondary | ICD-10-CM | POA: Insufficient documentation

## 2010-10-02 DIAGNOSIS — L089 Local infection of the skin and subcutaneous tissue, unspecified: Secondary | ICD-10-CM | POA: Insufficient documentation

## 2010-10-02 DIAGNOSIS — F17200 Nicotine dependence, unspecified, uncomplicated: Secondary | ICD-10-CM | POA: Insufficient documentation

## 2010-10-03 ENCOUNTER — Emergency Department (HOSPITAL_COMMUNITY)
Admission: EM | Admit: 2010-10-03 | Discharge: 2010-10-03 | Disposition: A | Discharge status: Home / Self Care | Payer: Self-pay | Attending: Emergency Medicine | Admitting: Emergency Medicine

## 2010-10-03 DIAGNOSIS — R110 Nausea: Secondary | ICD-10-CM | POA: Insufficient documentation

## 2010-10-03 DIAGNOSIS — F319 Bipolar disorder, unspecified: Secondary | ICD-10-CM | POA: Insufficient documentation

## 2010-10-03 DIAGNOSIS — M7989 Other specified soft tissue disorders: Secondary | ICD-10-CM | POA: Insufficient documentation

## 2010-10-03 DIAGNOSIS — Z79899 Other long term (current) drug therapy: Secondary | ICD-10-CM | POA: Insufficient documentation

## 2010-10-03 DIAGNOSIS — F341 Dysthymic disorder: Secondary | ICD-10-CM | POA: Insufficient documentation

## 2010-10-03 DIAGNOSIS — G40909 Epilepsy, unspecified, not intractable, without status epilepticus: Secondary | ICD-10-CM | POA: Insufficient documentation

## 2010-10-03 DIAGNOSIS — IMO0002 Reserved for concepts with insufficient information to code with codable children: Secondary | ICD-10-CM | POA: Insufficient documentation

## 2010-10-04 NOTE — H&P (Signed)
NAMEMARGENE, [name]                 ACCOUNT NO.:  [id number]  MEDICAL RECORD NO.:  [id number]           PATIENT TYPE:  I  LOCATION:  0506                          [facility]ACILITY[facility]  [facility]  PHYSICIAN:  [name], DO        DATE OF BIRTH:  Jul 28, 1970  DATE OF ADMISSION:  08/27/2010 DATE OF DISCHARGE:                      PSYCHIATRIC ADMISSION ASSESSMENT   This is a 40 year old female involuntarily petitioned on August 27, 2010.  HISTORY OF PRESENT ILLNESS:  This is a 40 year old female who reports having depressive symptoms, PTSD, also experiencing auditory hallucinations for the past 3 weeks.She states that she is now talking to these voices.  She states the voices are telling her that she is worthless, no reason to live.  Having suicidal thoughts but no specific plan.  She reports compliance with medications.  She also endorses that she has been using Vicodin since December taking approximately five daily with her last use on Tuesday.  Currently, denying any withdrawal symptoms.  Her stressors are that her son is in prison until October of this year.  Her other son recently was released from prison.  Her daughter is also pregnant and her due date is soon and they are also having financial issues.  PAST PSYCHIATRIC HISTORY:  This is her second visit.  She was here in December 2011 for depression, suicidal thoughts, PTSD symptoms.  SOCIAL HISTORY:  The patient is married female.  She has 6 children, again with one son in prison and a daughter expecting.  The patient is unemployed.  FAMILY HISTORY:  Denies.  ALCOHOL AND DRUG HISTORY:  Again, reports use of pain pills since December taking about 5 a day with no current withdrawal symptoms.  The patient has a history of marijuana use.  PRIMARY CARE PROVIDER:  None.  MEDICAL PROBLEMS:  History of back pain.  MEDICATIONS:  The patient lists, 1. Neurontin 300 mg every 6 hours 2. Depakote 500 mg b.i.d. 3. Risperdal 2 mg q.h.s. 4. Zoloft  25 mg daily 5. Xanax 1 mg t.i.d. 6. Vistaril two caps every 4 hours p.r.n.  DRUG ALLERGIES:  Listed as, 1. HALDOL with a side effect of a respiratory arrest. 2. PENICILLIN. 3. IMITREX. 4. DARVOCET.  PHYSICAL EXAMINATION:  The patient was fully assessed at [facility] [place].  This is a normally-developed middle-aged female.  She appears in no distress.  No obvious symptoms of withdrawal.  She looks like she has gained a little weight since her last visit.  She appears well- nourished, well-developed.  LABORATORY DATA:  Her laboratory data shows potassium of 3.4.  Alcohol level less than 5.  CBC within normal limits.  Her urine drug screen is positive for benzodiazepines, positive for marijuana.  Urine pregnancy test is negative.  MENTAL STATUS EXAM:  The patient is fully alert and cooperative, casually dressed, good eye contact.  Her speech is clear.  Does not appear to be internally preoccupied.  Thought processes overall are goal- directed and linear.  Partial insight as to her substance use.  Denies any current suicidal thoughts or homicidal thoughts. AXIS I:  Depressive disorder not otherwise specified,  history of posttraumatic stress disorder, opiate abuse. AXIS II:  Deferred. AXIS III:  Back pain. AXIS IV:  Psychosocial problems and financial issues. AXIS V:  Current is 30-35  PLAN:  Our plan is to continue her medications.  We will discuss her use of Xanax.  The patient was detoxed with her last hospitalization. Address her substance use.  Reinforce medication compliance.  Identify her support.  Will check a Depakote level.  Her tentative length of stay is 3-5 days.     [name], N.P.   ______________________________ [name], DO    JO/MEDQ  D:  08/28/2010  T:  08/28/2010  Job:  [id number]  Electronically Signed by [name] on 10/04/2010 10:58:02 AM Electronically Signed by [name] MD on 10/04/2010 08:00:43 PM

## 2010-10-06 ENCOUNTER — Emergency Department (HOSPITAL_COMMUNITY)
Admission: EM | Admit: 2010-10-06 | Discharge: 2010-10-06 | Disposition: A | Discharge status: Other Acute Inpatient Hospital | Payer: Self-pay | Attending: Emergency Medicine | Admitting: Emergency Medicine

## 2010-10-06 DIAGNOSIS — F17200 Nicotine dependence, unspecified, uncomplicated: Secondary | ICD-10-CM | POA: Insufficient documentation

## 2010-10-06 DIAGNOSIS — R45851 Suicidal ideations: Secondary | ICD-10-CM | POA: Insufficient documentation

## 2010-10-06 LAB — DIFFERENTIAL
Basophils Absolute: 0 10*3/uL (ref 0.0–0.1)
Eosinophils Relative: 3 % (ref 0–5)
Lymphocytes Relative: 35 % (ref 12–46)
Lymphs Abs: 2 10*3/uL (ref 0.7–4.0)
Monocytes Absolute: 0.5 10*3/uL (ref 0.1–1.0)
Neutro Abs: 2.9 10*3/uL (ref 1.7–7.7)

## 2010-10-06 LAB — CBC
HCT: 40.1 % (ref 36.0–46.0)
Hemoglobin: 13.4 g/dL (ref 12.0–15.0)
MCV: 89.5 fL (ref 78.0–100.0)
RDW: 13.1 % (ref 11.5–15.5)
WBC: 5.5 10*3/uL (ref 4.0–10.5)

## 2010-10-06 LAB — RAPID URINE DRUG SCREEN, HOSP PERFORMED
Amphetamines: NOT DETECTED
Benzodiazepines: POSITIVE — AB
Cocaine: NOT DETECTED
Opiates: NOT DETECTED
Tetrahydrocannabinol: POSITIVE — AB

## 2010-10-06 LAB — BASIC METABOLIC PANEL
BUN: 13 mg/dL (ref 6–23)
Calcium: 8.8 mg/dL (ref 8.4–10.5)
Creatinine, Ser: 0.63 mg/dL (ref 0.4–1.2)
GFR calc non Af Amer: >60 mL/min (ref >60)
Glucose, Bld: 102 mg/dL — ABNORMAL HIGH (ref 70–99)

## 2010-10-06 LAB — ETHANOL: Alcohol, Ethyl (B): <5 mg/dL (ref 0–10)

## 2010-10-06 LAB — PREGNANCY, URINE: Preg Test, Ur: NEGATIVE

## 2010-10-13 ENCOUNTER — Emergency Department (HOSPITAL_COMMUNITY)
Admission: EM | Admit: 2010-10-13 | Discharge: 2010-10-13 | Disposition: A | Discharge status: Home / Self Care | Payer: Self-pay | Attending: Emergency Medicine | Admitting: Emergency Medicine

## 2010-10-13 DIAGNOSIS — F319 Bipolar disorder, unspecified: Secondary | ICD-10-CM | POA: Insufficient documentation

## 2010-10-13 DIAGNOSIS — G8929 Other chronic pain: Secondary | ICD-10-CM | POA: Insufficient documentation

## 2010-10-13 DIAGNOSIS — G43809 Other migraine, not intractable, without status migrainosus: Secondary | ICD-10-CM | POA: Insufficient documentation

## 2010-10-13 DIAGNOSIS — M549 Dorsalgia, unspecified: Secondary | ICD-10-CM | POA: Insufficient documentation

## 2010-10-13 DIAGNOSIS — Z8659 Personal history of other mental and behavioral disorders: Secondary | ICD-10-CM | POA: Insufficient documentation

## 2010-10-13 DIAGNOSIS — G40802 Other epilepsy, not intractable, without status epilepticus: Secondary | ICD-10-CM | POA: Insufficient documentation

## 2010-10-13 DIAGNOSIS — Z79899 Other long term (current) drug therapy: Secondary | ICD-10-CM | POA: Insufficient documentation

## 2010-10-13 DIAGNOSIS — F4310 Post-traumatic stress disorder, unspecified: Secondary | ICD-10-CM | POA: Insufficient documentation

## 2010-10-29 ENCOUNTER — Emergency Department (HOSPITAL_COMMUNITY)
Admission: EM | Admit: 2010-10-29 | Discharge: 2010-10-30 | Discharge status: Against Medical Advice | Payer: Self-pay | Attending: Emergency Medicine | Admitting: Emergency Medicine

## 2010-10-29 DIAGNOSIS — Z0389 Encounter for observation for other suspected diseases and conditions ruled out: Secondary | ICD-10-CM | POA: Insufficient documentation

## 2010-10-31 ENCOUNTER — Emergency Department (HOSPITAL_COMMUNITY): Payer: Self-pay

## 2010-10-31 ENCOUNTER — Emergency Department (HOSPITAL_COMMUNITY)
Admission: EM | Admit: 2010-10-31 | Discharge: 2010-10-31 | Discharge status: Against Medical Advice | Payer: Self-pay | Attending: Emergency Medicine | Admitting: Emergency Medicine

## 2010-10-31 DIAGNOSIS — M549 Dorsalgia, unspecified: Secondary | ICD-10-CM | POA: Insufficient documentation

## 2010-11-01 ENCOUNTER — Emergency Department (HOSPITAL_COMMUNITY): Payer: Self-pay

## 2010-11-01 ENCOUNTER — Emergency Department (HOSPITAL_COMMUNITY)
Admission: EM | Admit: 2010-11-01 | Discharge: 2010-11-01 | Disposition: A | Discharge status: Home / Self Care | Payer: Self-pay | Attending: Emergency Medicine | Admitting: Emergency Medicine

## 2010-11-01 DIAGNOSIS — Z8659 Personal history of other mental and behavioral disorders: Secondary | ICD-10-CM | POA: Insufficient documentation

## 2010-11-01 DIAGNOSIS — G43909 Migraine, unspecified, not intractable, without status migrainosus: Secondary | ICD-10-CM | POA: Insufficient documentation

## 2010-11-01 DIAGNOSIS — F4310 Post-traumatic stress disorder, unspecified: Secondary | ICD-10-CM | POA: Insufficient documentation

## 2010-11-01 DIAGNOSIS — M545 Low back pain, unspecified: Secondary | ICD-10-CM | POA: Insufficient documentation

## 2010-11-01 DIAGNOSIS — G40802 Other epilepsy, not intractable, without status epilepticus: Secondary | ICD-10-CM | POA: Insufficient documentation

## 2010-11-01 DIAGNOSIS — S20229A Contusion of unspecified back wall of thorax, initial encounter: Secondary | ICD-10-CM | POA: Insufficient documentation

## 2010-11-01 DIAGNOSIS — Y92009 Unspecified place in unspecified non-institutional (private) residence as the place of occurrence of the external cause: Secondary | ICD-10-CM | POA: Insufficient documentation

## 2010-11-01 DIAGNOSIS — Z79899 Other long term (current) drug therapy: Secondary | ICD-10-CM | POA: Insufficient documentation

## 2010-11-01 DIAGNOSIS — Y998 Other external cause status: Secondary | ICD-10-CM | POA: Insufficient documentation

## 2010-11-01 DIAGNOSIS — F411 Generalized anxiety disorder: Secondary | ICD-10-CM | POA: Insufficient documentation

## 2010-11-01 DIAGNOSIS — F319 Bipolar disorder, unspecified: Secondary | ICD-10-CM | POA: Insufficient documentation

## 2010-11-01 DIAGNOSIS — W108XXA Fall (on) (from) other stairs and steps, initial encounter: Secondary | ICD-10-CM | POA: Insufficient documentation

## 2010-11-08 NOTE — H&P (Signed)
NAMESAREE, KROGH NO.:  [id number]   MEDICAL RECORD NO.:  [id number]          PATIENT TYPE:  EMS   LOCATION:  ED                            FACILITY:  [facility]   PHYSICIAN:  Gardiner Barefoot, MD    DATE OF BIRTH:  05/19/1971   DATE OF ADMISSION:  07/28/2007  DATE OF DISCHARGE:                              HISTORY & PHYSICAL   NO DICTATION      Gardiner Barefoot, MD     RWC/MEDQ  D:  07/29/2007  T:  07/29/2007  Job:  [id number]
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
## 2010-11-08 NOTE — Discharge Summary (Signed)
[name], [name]                 ACCOUNT NO.:  [id number]   MEDICAL RECORD NO.:  [id number]          PATIENT TYPE:  INP   LOCATION:  A321                          FACILITY:  [facility]   PHYSICIAN:  Skeet Latch, DO    DATE OF BIRTH:  25-Jan-1971   DATE OF ADMISSION:  03/20/2008  DATE OF DISCHARGE:  10/02/2009LH                               DISCHARGE SUMMARY   DISCHARGE DIAGNOSES:  1. Streptococcal pneumonia.  2. Bipolar disorder.  3. Chronic pain syndrome.  4. History of polysubstance abuse.  5. History of posttraumatic stress disorder.  6. History of possible chronic obstructive pulmonary disease.  7. History of acute respiratory failure requiring ventilator      management.   BRIEF HOSPITAL COURSE:  This is a 40 year old Caucasian female with  multiple medical problems, and per history, she is certainly  noncompliant with multiple psychiatric issues.  The patient presented  with a fever over 100.1 with chest pain, shortness of breath, and  supposed seizure x2 at home.  The patient apparently has been  noncompliant with her medications.  Symptoms are ongoing for 4-5 days.  The patient was having cough with productive green sputum, was also  complaining of being fatigued.  The patient is also complaining of a  back pain associated with her cough and was stated to have a seizure at  least twice at home and twice on arrival to the emergency room.  The  patient was on previous antiseizure medications but has not been taken  for a quite some time, as she has no primary care physician.  The  patient has also complained of chills, myalgias, and nasal symptoms.  The patient was seen.  Initial labs showed a white count of 21,000 with  left shift, platelet count 215,000, hemoglobin 13.2, sodium 136,  potassium 3.9, chloride 103, CO2 28, glucose 172, BUN 10, creatinine  0.91.  A chest x-ray showed patchy bilateral airspace disease most  consistent with multifocal pneumonia.  The patient  was admitted and was  started on IV antibiotics.  For her bipolar disorder, she was started  back on Depakote, Lexapro, as well as for her seizures.  The patient's  coughing and respiratory symptoms have greatly improved over the last  few days.  Mucinex was added to her daily regimen.  The patient is  receiving breathing treatments, supplemental oxygen, and seems to be  feeling better at this time.  The patient had a repeat chest x-ray done  on March 26, 2008, that showed improved bilateral airspace disease and  small bilateral effusions.  The patient was also placed on steroids.  This has been continually weaned.  She is on oral steroids at this time.  Her blood cultures have been unremarkable.  Her respiratory culture did  show some Gram-positive rods and moderate Streptococcus pneumonia.  At  this time, we feel the patient is stable enough to be discharged to home  with close followup with her primary care physician.   MEDICATIONS ON DISCHARGE:  1. Lexapro 20 mg twice a day.  2. Xanax 1 mg 3 times  a day.  3. Depakote 500 mg daily and Depakote 500 mg 2 tablets at bedtime.  4. Prozac 100 mg daily, I will clarify.  5. Prilosec 20 mg daily.  6. Vicodin ES q.4 h. p.r.n.  7. Multivitamin daily.  8. Levaquin 750 mg daily for 5 days.  9. Mucinex 600 mg twice a day p.r.n.  10.Prednisone taper dose.  11.Tessalon Perles 100 mg 3 times a day p.r.n.   VITAL SIGNS:  On discharge, temperature is 98.2, pulse 92, respirations  18, blood pressure 107/71.  She is sating 93% on 2 L.   LABORATORY DATA:  Sodium 140, potassium 3.7, chloride 194, CO2 is 39,  glucose 130, BUN 7, creatinine 0.58.  White count is 9.5, hemoglobin  11.6, hematocrit 34.0, platelet count is 330,000.   CONDITION AT DISCHARGE:  Stable.   DISPOSITION:  The patient is to be discharged to home.   DISCHARGE INSTRUCTIONS:  The patient is to have a regular diet.  The  patient is to increase her activity slowly.  The patient is  to follow up  with her primary care physician with [facility] in 2-3 days.  The patient is to stop smoking if she does have tobacco abuse.  The  patient is to take all medications as directed.  The patient states that  she has nebulizing treatment medication at home as well as a machine.  The patient is instructed to take the medications with the nebulizers as  needed and as directed.  The patient will need close followup with the  [facility] Department regarding her history of polysubstance abuse as well  as her psychiatric history.  I will refer to Health Department if the  patient has a psychiatrist that she needs to follow with at this time.  The patient understands all of these instructions.  The patient may need  a referral of home health regarding her pulmonary status.  We will get a  room air oxygenation level prior to discharge.  If it is low, the  patient may need home health regarding respiratory treatment.      Skeet Latch, DO  Electronically Signed     SM/MEDQ  D:  03/27/2008  T:  03/28/2008  Job:  [id number]
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
## 2010-11-08 NOTE — Group Therapy Note (Signed)
[name], [name]                 ACCOUNT NO.:  [id number]   MEDICAL RECORD NO.:  [id number]          PATIENT TYPE:  INP   LOCATION:  A321                          FACILITY:  [facility]   PHYSICIAN:  Skeet Latch, DO    DATE OF BIRTH:  [birth year]   DATE OF PROCEDURE:  DATE OF DISCHARGE:                                 PROGRESS NOTE   SUBJECTIVE:  [name] states that she is improving.  The patient states  that her coughing is still present, but is nonproductive and improving.  The patient denies any chest pain, abdominal pain or genitourinary  complaints.   OBJECTIVE:  VITAL SIGNS:  Temperature 97.7, pulse 88, respirations 16,  blood pressure 108/60, she is satting 97% on 2 liters.  GENERAL:  She is awake, alert and oriented x3.  CARDIOVASCULAR:  Regular rate and rhythm.  No murmurs, rubs or gallops.  LUNGS:  Slightly diminished.  She does have some slight external wheezes  and small rales at the bases.  ABDOMEN:  Soft, nontender, nondistended.  Positive bowel sounds.  No  rigidity or guarding.  EXTREMITIES:  No clubbing, cyanosis or edema.   LABORATORY DATA:  Sodium 141, potassium 3.6, chloride 93, CO2 is 37,  glucose 130, BUN 8, creatinine 0.63, white count is 17,200, hemoglobin  12.9, hematocrit 37.7, platelet count is 402, blood cultures final  report is no growth.   ASSESSMENT:  1. Probable streptococcal pneumonia.  We will continue antibiotics at      this time as well as neb treatments and oxygen.  We will convert      antibiotics to p.o. in the next 24 hours.  2. History of bipolar disorder and chronic pain.  We will discontinue      IV narcotics.  Switch her to her home Vicodin every 4-6 hours as      needed.  3. Lastly for the patient's questionable chronic obstructive pulmonary      disease, we will  decrease her p.o. steroids at this time and start      a tapered dose.   Lastly, I anticipate the patient being discharged in the next 24 hours  if she continues to be  stable.      Skeet Latch, DO  Electronically Signed     SM/MEDQ  D:  03/26/2008  T:  03/26/2008  Job:  [phone]
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
## 2010-11-08 NOTE — H&P (Signed)
NAMEBROOKLYNNE, PEREIDA                 ACCOUNT NO.:  [id number]   MEDICAL RECORD NO.:  [id number]          PATIENT TYPE:  INP   LOCATION:  A321                          FACILITY:  [facility]   PHYSICIAN:  Lonia Blood, M.D.      DATE OF BIRTH:  [birth year]   DATE OF ADMISSION:  03/20/2008  DATE OF DISCHARGE:  LH                              HISTORY & PHYSICAL   PRIMARY CARE PHYSICIAN:  The patient is unassigned to [place].   PRESENTING COMPLAINT:  Chest pain.   HISTORY OF PRESENT ILLNESS:  The patient is a 40 year old white female  with multiple medical problems with an extremely noncompliant and lot of  psych issues.  She presented today with the fever of over 101, chest  pain, shortness of breath and observed seizure x2 in home.  The patient  has apparently been noncompliant.  Her symptoms have been going on for  at least 4-5 days.  She had cough with some greenish sputum.  She has  been weak.  Also, complained of upper back pain with the coughing.  She  was seen to have a seizure at least twice at home and twice on arrival  here.  She was on antiseizure medicines, but quit taking it for a while.  She has no primary care physician.  She also has some chills, myalgias,  and nasal congestions in addition to the productive cough.   PAST MEDICAL HISTORY:  Significant for bipolar disorder with multiple  hospitalizations at [facility].  The patient had  ventilator-dependent respiratory failure in May 2008 and was in the  hospital.  That was secondary to cardiopulmonary arrest.  History of  seizure disorder.  History of polysubstance abuse.  History of  depression, multiple back pain, multiple drug allergies, post-traumatic  stress disorder.  History of migraine headaches, heart murmur, multiple  orthopedic injury, history of left breast biopsy, and history of  bilateral tubal ligation.   ALLERGIES:  She reports allergies to AMITRIPTYLINE, DARVOCET N 100,  HALDOL, IMITREX and PENICILLIN  that cause abnormal behaviors nausea,  vomiting, anaphylaxis and shortness of breath.   MEDICATIONS:  Again this revealed multivitamin 1 tablet daily, Vicodin,  Depakote, Xanax, Lexapro, and ibuprofen.  The patient has reported to be  noncompliant due to lack of having a doctor.   SOCIAL HISTORY:  Pretty extensive.  The patient lives in [place].  Has extensive problem with psych issues and has had multiple admissions  at [facility].  She smokes about half a pack per day.  Denied any  IV drug use for currently and alcohol but the patient is noted to have  history of polysubstance abuse.   FAMILY HISTORY:  Denied any significant family history.   REVIEW OF SYSTEMS:  A 12-point review of systems is negative except for  HPI.   PHYSICAL EXAMINATION:  VITAL SIGNS:  Temperature is 97.5, blood pressure  114/68, pulse 105, respiratory rate 20, sats 85% on room air.  GENERAL: The patient is drowsy after receiving some medications, but in  no acute distress.  HEENT:  PERRLA.  EOMI.  NECK:  Supple.  No JVD.  No lymphadenopathy.  RESPIRATORY:  She has decreased air entry bilaterally in both lungs  bases.  CARDIOVASCULAR:  S1 and S2.  No murmurs.  ABDOMEN:  Soft, nontender with positive bowel sounds.  EXTREMITIES:  No edema, cyanosis, or clubbing.   LABORATORY DATA:  White count is 21,000 with left shift ANC of 19.8,  platelet count 215 with hemoglobin 13.2.  Sodium is 136, potassium 3.9,  chloride 103, CO2 28, glucose 172, BUN 10, creatinine 0.91, calcium 9.0,  albumin is 2.7, and total protein 5.7.  Chest x-ray showed patchy  bilateral airspace disease most consistent with multifocal pneumonia.   ASSESSMENT:  Therefore, this is a 40 year old female with multiple  medical issues especially multiple psych issues presenting with  multifocal pneumonia, in addition to a myriad of other medical problems.   PLAN:  1. Multifocal pneumonia.  We will admit the patient and start her on       IV antibiotics.  The patient is allergic to penicillin, so will      avoid those class of medicine.  I will put her on quinolone using      Avelox 400 mg daily IV.  We will repeat chest x-ray done to see if      this patient is improving.  2. Bipolar disorder.  We will place her back on her Depakote and the      Lexapro as necessary.  3. Seizure disorder.  Again the patient may have a seizure because she      is out of her Depakote.  We will check Depakote level and start her      on Depakote starting from 500 b.i.d. and adjust as necessary.  4. History of polysubstance abuse.  I will check urine drug screen at      this point and then continue to adjust.  5. Post-traumatic stress disorder.  We will continue her on medication      as necessary.  Further treatment depend on the patient's response      to our initial measures.      Lonia Blood, M.D.  Electronically Signed     LG/MEDQ  D:  03/20/2008  T:  03/20/2008  Job:  [id number]
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
## 2010-11-11 NOTE — Discharge Summary (Signed)
[name], [name]                 ACCOUNT NO.:  [id number]   MEDICAL RECORD NO.:  [id number]          PATIENT TYPE:  IPS   LOCATION:  0307                          FACILITY:  [facility]   PHYSICIAN:  Anselm Jungling, MD  DATE OF BIRTH:  07-Jul-1970   DATE OF ADMISSION:  07/17/2006  DATE OF DISCHARGE:  07/20/2006                               DISCHARGE SUMMARY   IDENTIFYING DATA AND REASON FOR ADMISSION:  The patient is a 40 year old  married female with a history of bipolar disorder, severe past trauma  and loss.  She had been admitted to our inpatient service in the past.  She became a suicide risk after an argument with her husband.  She also  came to [place] with a history of opiate dependence.  She reported that her  family doctor had been weaning her off Vicodin, which she had relapsed  on.  She also came to [place] on a regimen of Prozac, Depakote, Zyprexa, and  Valium 10 mg t.i.d., but had not taken these medications for 1-2 days  prior to admission.  She was apparently not involved in any regular  outpatient psychiatric treatment.  Please refer to the admission note  for further details pertaining to the symptoms, circumstances and  history that led to her hospitalization.   She was given initial Axis I diagnoses of bipolar disorder NOS,  polysubstance dependence, and posttraumatic stress disorder, chronic,  and marital problems.   MEDICAL AND LABORATORY:  The patient was medically and physically  assessed by the psychiatric nurse practitioner.  She was in good health  without any active or chronic medical problems.  She had ostensibly been  taking Vicodin for chronic pain, but there were no acute medical issues  during this brief inpatient stay.  She was given small doses of Vicodin  during the first 24 hours of treatment; after that it was discontinued  in favor of detoxification protocols (see below).   HOSPITAL COURSE:  The patient was admitted to the adult inpatient  psychiatric  service.  She presented as a petite, slender, ill-appearing  woman who looked very tired, depressed, and disheveled.  She was alert  and fully oriented, but quite tired because she had been up all night in  the admission process.  She did not display any signs or symptoms of  psychosis or thought disorder.  She denied suicidal ideation and  verbalized a strong desire for help.   She was placed on Librium and clonidine detoxification protocols.  She  was continued on Prozac and Depakote, and to better address her anxiety  symptoms, Risperdal 0.5 mg t.i.d. was begun on a trial basis with good  results.  Zyprexa was not continued, nor was Valium, which was felt to  be clearly inappropriate for this chemically dependent individual.   On the second hospital day there was a family session involving the  patient and her husband.  In that meeting, the patient stated that she  was no longer suicidal.  She reported that her life at home is good.  Husband stated that he wanted the  patient to get better, and expressed  concern the patient spends all of her time at home, mostly sitting on  the couch.  The patient's husband reported that she does not socialize  with family or friends.  The patient stated that she had been hearing  voices and seeing objects for over 2 years.  Husband stated that he is  afraid to leave the patient home alone, and reported that a friend lives  with them.  Husband encouraged the patient to be honest and stated that  she needs to let the past go so she can focus on the future.  The  patient stated that she wanted to become more active, but she could not  the pass go because of the traumas that she has endured.  The patient  stated that she was willing to follow up with a psychiatrist and take  medications consistently, and would also like to go to individual  counseling again.  The counselor gave the patient and her husband  pamphlets on suicide prevention and the crisis  hotline.   Following this, the patient continued as a reasonably good participant  in the treatment program.  She continued to feel awful with dysphoric  mood, but with absent suicidal ideation during the remainder of her  stay.   By the third hospital day, the patient reported that she was feeling  much better.  She had had further positive visits with her husband and  mother, and she learned that her mother at gotten her a job at a  [employer].   On the third hospital day, we received a Depakote level of 33, the  subtherapeutic level appearing to be explained by the patient not having  taken Depakote for at least 3 days prior to admission.   On the fourth hospital day, the patient remained absent suicidal  ideation, she felt and looked much better.  She was tolerating her  medications and appeared to be benefiting from the new Risperdal.  She  completed the Librium and clonidine protocols and appeared ready for  discharge.  She agreed to the following aftercare plan.   AFTERCARE:  The patient was to follow up with [facility]  with an appointment on July 23, 2006.   DISCHARGE MEDICATIONS:  1. Prozac 10 mg daily.  2. Risperdal 0.5 mg t.i.d.  3. Depakote 500 mg b.i.d.   The patient was instructed not to take Zyprexa any longer.   DISCHARGE DIAGNOSES:  AXIS I:  Bipolar disorder, type 2, most recently depressed.  Opiate dependence, early remission.  Benzodiazepine dependence, early remission.  Posttraumatic stress disorder.   AXIS II  Deferred.   AXIS III:  No acute or chronic illnesses.   AXIS IV:  Stressors severe.   AXIS V:  GAF on discharge 65.      Anselm Jungling, MD  Electronically Signed     SPB/MEDQ  D:  07/23/2006  T:  07/23/2006  Job:  [id number]

## 2010-11-11 NOTE — Discharge Summary (Signed)
Norma, [name]                 ACCOUNT NO.:  [id number]   MEDICAL RECORD NO.:  [id number]          PATIENT TYPE:  IPS   LOCATION:  0306                          FACILITY:  [facility]   PHYSICIAN:  Anselm Jungling, MD  DATE OF BIRTH:  1971/01/21   DATE OF ADMISSION:  08/21/2006  DATE OF DISCHARGE:  08/25/2006                               DISCHARGE SUMMARY   IDENTIFYING DATA/REASON FOR ADMISSION:  This was one of several [facility]  admissions for [name], a 40 year old married white female who returned at  this time with a complaint of depression, suicidal ideation, and  auditory hallucinations.  She also admitted to relapsing on narcotic  analgesics and benzodiazepines.  Please refer to the admission note for  further details pertaining to the symptoms, circumstances and history  that led to her hospitalization.   INITIAL DIAGNOSTIC IMPRESSION:  She was given initial AXIS I diagnoses  of bipolar disorder, depressed, with psychotic features, and opiate and  benzodiazepine dependencies.   MEDICAL/LABORATORY:  The patient was medically and physically assessed  by the psychiatric nurse practitioner.  She came to [place] with allergies to  PENICILLIN, IMITREX, DARVOCET, and AMITRIPTYLINE.  She was in generally  good health, but had been taking opiate analgesics to address chronic  pain.   The patient was abruptly transferred, on an emergent and acute basis,  from our inpatient psychiatric service to the emergency department at  [facility], due to respiratory arrest, which occurred on the  fourth hospital day.  This apparently was the result of an adverse  response to Haldol, that the patient had just begun to receive on a  trial basis to address anxiety and auditory hallucinations.  Please  refer to the admission and discharge summaries from her subsequent  inpatient psychiatric admission of August 29, 2006 and August 30, 2006 for  further information regarding her hospital course following  August 25, 2006, which was the last treatment day of this inpatient psychiatric  stay.   HOSPITAL COURSE:  The patient was admitted to the adult inpatient  psychiatric service.  She had come to [place] on a regimen of Depakote,  Prozac and Risperdal.  Her medications were adjusted, with changes  consisting of discontinuation of Risperdal, and a new trial of Haldol.  As referenced above, the patient had a severely adverse response to  HALDOL, leading to respiratory arrest, and immediate emergency transfer  to the hospital for medical treatment.   She was later treated at the medical hospital for five days, and once  completely recovered, return to our inpatient psychiatric service for  completion of her psychiatric course.  Please refer to the subsequent  admission and discharge notes for further details pertaining to the  remainder of her hospital course.   DISCHARGE MEDICATIONS:  1. Prozac 40 mg daily.  2. Depakote ER 250 mg daily.   As referenced above, she had received HALDOL, but this was not on her  discharge medication list for reasons that it caused a severe adverse  reaction and respiratory arrest.   DISCHARGE DIAGNOSES:  AXIS I:  Major depressive disorder, recurrent,  severe with mild psychotic features.  Polysubstance dependence.  Rule  out anxiety disorder.  AXIS II:  Deferred.  AXIS III:  Acute cardiopulmonary arrest with emergency transfer to  medical facility.  AXIS IV:  Stressors:  Severe.  AXIS V:  GAF on discharge 45.      Anselm Jungling, MD  Electronically Signed     SPB/MEDQ  D:  10/25/2006  T:  10/25/2006  Job:  [id number]

## 2010-11-11 NOTE — Op Note (Signed)
NAME:  [name], [name]                          ACCOUNT NO.:  [id number]   MEDICAL RECORD NO.:  [id number]                   PATIENT TYPE:  AMB   LOCATION:  DAY                                  FACILITY:  [facility]   PHYSICIAN:  J. Darreld Mclean, M.D.              DATE OF BIRTH:  [birth year]   DATE OF PROCEDURE:  DATE OF DISCHARGE:                                 OPERATIVE REPORT   PREOPERATIVE DIAGNOSIS:  Tear medial meniscus right knee.   POSTOPERATIVE DIAGNOSIS:  Tear medial meniscus right knee.   PROCEDURE:  1. Operative arthroscopy.  2. Partial medial meniscectomy.   SURGEON:  J. Darreld Mclean, M.D.   ASSISTANT:  Candace Cruise, [facility]   ANESTHESIA:  General   TOURNIQUET TIME:  19 minutes.   INDICATIONS:  The patient is a 40 year old female with pain and tenderness  in the right knee, giving way.  MRI was positive for tear in the medial  meniscus.  The risks and imponderables of the procedure were discussed with  the patient preoperatively.  She appeared to understand and agreed to the  procedure as outlined.   OPERATIVE FINDINGS:  The suprapatellar pouch was within normal limits.  The  undersurface of the patella looked normal medially.  There was a tear in  multiple areas of the posterior horn of the medial meniscus and grade 2  changes of the articular surfaces of the anterior cruciate.  Laterally the  meniscus was normal and articular surfaces were normal.   DESCRIPTION OF PROCEDURE:  The patient was given general anesthesia and  placed supine on the operating room table.  Tourniquet and leg holder  placed, deflated right upper thigh.  The patient was prepped and draped in  the usual manner.  Prior to positioning of the inflow cannula and putting  the tourniquet up we reverified the patient and that we were doing the right  knee.  The leg was elevated and wrapped circumferentially with an Esmarch  bandage, tourniquet inflated to 300 mmHg.  Inflow cannula inserted  medially  and lactated Ringers instilled by an infusion pump.   Arthroscope inserted laterally and knee systematically examined.  Please see  findings above.  Pertinent pictures were taken.  Attention was directed to  the medial side and using a meniscal shaver and meniscal punch that removed  the meniscus until there was a good smooth contour.  The knee was  systematically re-examined and no new pathology found.  The knee was  irrigated with the remaining part of lactated Ringers.  The wound was  reapproximated using 3-0 Nylon in an interrupted vertical mattress manner.  Marcaine 0.25% was instilled into each portal.  The tourniquet was deflated  after 19 minutes.   Sterile dressing applied.  Bulky dressing applied.  Knee immobilizer  applied.  Patient went to recovery in good condition.  Appropriate analgesia  was given for pain, will see her  in the office in approximately 10 days to 2  weeks.  Physical therapy has been arranged.                                               [name], M.D.    JWK/MEDQ  D:  03/18/2002  T:  03/18/2002  Job:  [id number]
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
## 2010-11-11 NOTE — Discharge Summary (Signed)
[name], [name]                 ACCOUNT NO.:  [id number]   MEDICAL RECORD NO.:  [id number]          PATIENT TYPE:  OBV   LOCATION:  A428                          FACILITY:  [facility]   PHYSICIAN:  Lazaro Arms, M.D.   DATE OF BIRTH:  02-05-1971   DATE OF ADMISSION:  04/11/2006  DATE OF DISCHARGE:  10/19/2007LH                                 DISCHARGE SUMMARY   DISCHARGE DIAGNOSES:  1. Right lower quadrant pain.  2. Right ovarian cyst.  3. No evidence of torsion.   Please refer to the history and physical for details of admission to the  hospital.   HOSPITAL COURSE:  The patient was presented to the ER complaining of acute  onset of right lower quadrant pain.  She underwent CT scan because the ER  physician was concerned about the appendix.  At that time it showed about a  3 cm right ovarian cyst and there was some question of diminished venous  return.  As a result, I followed that up with an ultrasound and did Doppler  flow which showed normal flow.  It did not show any evidence of torsion.  It  was 3.5 cm and appeared to have some blood in it and appeared to be a  hemorrhagic corpus luteum. She was kept in the hospital during the next day.  Underwent pain management.  Age normally.  And abdominal exam remained  essentially unchanged.  She was discharged to home on October 19 on oral  pain medicine and Phenergan.  Will wait at least a couple weeks to see if  this will resolve on its own because of its relatively small size before  deciding on surgical management.  She will be seen in the office in one  week.      Lazaro Arms, M.D.  Electronically Signed     LHE/MEDQ  D:  04/13/2006  T:  04/15/2006  Job:  [id number]
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
## 2010-11-11 NOTE — H&P (Signed)
NAME:  [name], [name]                          ACCOUNT NO.:  [id number]   MEDICAL RECORD NO.:  [id number]                   PATIENT TYPE:  OUT   LOCATION:                                       FACILITY:  [facility]   PHYSICIAN:  [id number]                                DATE OF BIRTH:  July 25, 1970   DATE OF ADMISSION:  03/18/2002  DATE OF DISCHARGE:                                HISTORY & PHYSICAL   CHIEF COMPLAINT:  Pain in right knee.   HISTORY OF PRESENT ILLNESS:  The patient is a 40 year old white female who  is to be admitted through day surgery on March 18, 2002 to undergo right  knee arthroscopy and meniscectomy.   She was initially seen here in the office on February 17, 2002 complaining of  right knee pain; she was asked to be seen in our office by Dr. Sherrie Mustache  because of continued complaints of right knee pain and failure to improve  with the pain with conservative measures.  It was felt by Dr. [name], after examining the patient, that she possibly had a torn meniscus  or strain to the medial collateral ligament.  She underwent an MRI as per  order on February 21, 2002 and it states she had a torn medial meniscus which  originated from an intrameniscal and parameniscal cyst formation.  There  were no ligamentous injuries noted or other abnormalities.   The MRI was explained in detail with the patient using a knee model and  picture diagram.  Recommendations were that of arthroscopy and meniscectomy.  She stated she wanted to go ahead with the procedure as soon as possible  because she is tired of the instability of the knee and chronic pain.   PAST MEDICAL HISTORY:  Depression and anxiety.  She denies diabetes  mellitus, stroke, cardiac or respiratory problems.   OPERATIONS:  C-section, August 29, 1991, and a C-section on December 21, 1992.   MEDICATIONS:  1. Effexor -- dose unknown -- one tab b.i.d.  2. Xanax 0.25 mg one tab t.i.d.   ALLERGIES:  Allergies to PENICILLIN,  IMITREX and DARVOCET.   MEDICAL DOCTOR:  Dr. Elliot Cousin.   FAMILY AND SOCIAL HISTORY:  The patient is separated.  She has five children  alive and well.  She smokes three cigarettes a day.  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   REVIEW OF SYSTEMS:  Review of systems is negative except for psychiatric  illness and as well as hospitalization.   PHYSICAL EXAMINATION:  GENERAL:  The patient is 5 feet 5 inches.  She weighs  approximately 127 pounds.  She is alert and oriented x3.  HEENT:  Within normal limits except the patient is totally edentulous.  NECK:  Neck is supple with no  thyromegaly or masses palpated.  LUNGS:  Lungs are clear to A&P.  HEART:  Regular rhythm without murmur or cardiomegaly.  ABDOMEN:  Abdomen is flat, soft and nontender.  No organomegaly or masses  palpated.  Hypoactive bowel sounds are auscultated.  EXTREMITIES:  Right knee -- 0 to 90 degrees of flexion.  She has pain beyond  any physical findings.  There is some tenderness, however, to the medial  joint line and stretching the medial collateral ligament.  There is no  ligamentous laxity noted.  She had a negative anterior drawer sign.  Neurovascular was intact.  Other extremities within normal limits and  neurovascularly intact to them.  SKIN:  Intact.  CNS:  Positive for depression.   IMPRESSION:  1. Torn medial meniscus on right knee.  2. Depression and anxiety.  3. History of migraine headaches.   PLAN:  The patient has been set up to undergo right knee arthroscopy and  meniscectomy on March 18, 2002.  Labs are pending.     Candace Cruise, P.A.                        [phone]    BB/MEDQ  D:  03/07/2002  T:  03/07/2002  Job:  [id number]
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
## 2010-11-11 NOTE — H&P (Signed)
[name], [name]                 ACCOUNT NO.:  [id number]   MEDICAL RECORD NO.:  [id number]          PATIENT TYPE:  IPS   LOCATION:  0307                          FACILITY:  [facility]   PHYSICIAN:  Anselm Jungling, MD  DATE OF BIRTH:  February 18, 1971   DATE OF ADMISSION:  07/17/2006  DATE OF DISCHARGE:                       PSYCHIATRIC ADMISSION ASSESSMENT   IDENTIFYING INFORMATION:  The patient is a 40 year old female who was  involuntary committed on July 17, 2006.   HISTORY OF PRESENT ILLNESS:  The patient states that she tried to kill  herself.  She is here on papers again threatening to harm herself.  The  patient apparently had written a note to her family implying some  suicidal thoughts and, per chart, there was a conflict with her husband  that prompted these statements where she was threatening suicidal  ideation.   PAST PSYCHIATRIC HISTORY:  The patient had a prior admission in October  of 2007.   SOCIAL HISTORY:  This is a 40 year old married female.  She currently  lives with her husband.  The remainder of social history is unclear as  the patient is a poor historian.  Very sedated at this time.   FAMILY HISTORY:  Unknown.   ALCOHOL/DRUG HISTORY:  The patient smokes.  Again per chart, some  concern about opiate dependence and being tapered off her Vicodin.   PRIMARY CARE PHYSICIAN:  Primary care Herbie Lehrmann is unclear.   MEDICAL PROBLEMS:  Migraines, asthma and seizures.   MEDICATIONS:  Has been on Prozac 10 mg daily, Depakote 500 mg p.o.  b.i.d., Valium 10 mg t.i.d., Zyprexa 7.5 mg at bedtime and Vicodin q.6h.  p.r.n.   ALLERGIES:  PENICILLIN, DARVOCET, IMITREX and AMITRIPTYLINE.   PHYSICAL EXAMINATION:  The patient was fully assessed at [facility].  She is a thin, sedated female currently lying in bed.  She is  in no acute distress.  She wakens momentarily, then falls back asleep.  Her temperature is 98.2, heart rate 70, respirations 20, blood pressure  115/82, 5 feet 5 inches tall and 114 pounds.   LABORATORY DATA:  Urinalysis is negative.  Pregnancy test was negative.  Urine drug screen is positive for benzodiazepines, positive for opiates.  Her sodium was low at 131.  CBC within normal limits.  Salicylate less  than 4.  Acetaminophen level less than 10 and her alcohol level was 82.  She had a normal EKG.   MENTAL STATUS EXAM:  Again, she is currently lying in bed.  She is  sedated.  Answers a brief word or two when stimulated, then falls back  asleep very readily.  Unable to ascertain her mood, thought process and  cognitive function.  She is a poor historian at this time.   DIAGNOSES:  AXIS I:  Bipolar disorder per chart.  Rule out alcohol  abuse.  Rule out opiate abuse.  AXIS II:  Deferred.  AXIS III:  Migraines, asthma and seizures.  AXIS IV:  Medical problems, possibly other psychosocial problems.  AXIS V:  Current 25-30.   PLAN:  To stabilize mood and thinking,  to detox off recent alcohol use  and Valium.  We will put patient on Librium protocol.  Monitor her  withdrawal symptoms and vital signs.  Will check a Depakote level.  As  the patient stabilizes, we will obtain further background.  Contact  husband for concerns and collaborative information.  Medication  compliance to be reinforced.  We will have a family session with  husband.   TENTATIVE LENGTH OF STAY:  Five to six days.      [name], N.P.      Anselm Jungling, MD  Electronically Signed    JO/MEDQ  D:  07/20/2006  T:  07/20/2006  Job:  [id number]

## 2010-11-11 NOTE — Consult Note (Signed)
Norma, [name]                 ACCOUNT NO.:  [id number]   MEDICAL RECORD NO.:  [id number]          PATIENT TYPE:  INP   LOCATION:  1229                         FACILITY:  [facility]   PHYSICIAN:  Antonietta Breach, M.D.  DATE OF BIRTH:  May 03, 1971   DATE OF CONSULTATION:  08/27/2006  DATE OF DISCHARGE:                                 CONSULTATION   REQUESTING PHYSICIAN:  Charlaine Dalton. Sherene Sires, MD, FCCP.   REASON FOR CONSULTATION:  Depression, psychosis, psychotropic medication  treatment.   HISTORY OF PRESENT ILLNESS:  [name] is a 40 year old female  admitted to the [facility] on August 25, 2006 after an asystole  episode.  The patient had been on a psychiatric ward and was admitted to  the ICU after evaluation and treatment of asystole.   Patient describes several days of depressed mood, poor energy, poor  concentration, and suicidal thoughts.  She had relapsed on narcotics and  benzodiazepine excess use and had been admitted to the psychiatric unit  on February 26th for severe depression as well as substance detox.   She also has a history of post-traumatic symptoms.  Currently, her mood  is improved.  Suicidal thoughts are resolved.  She has no thoughts of  harming others.  Also, her hallucinations have resolved.  She has hope.  She is oriented and cooperative with care.   Recently, the patient's hallucinations were not resolving with  Risperdal, and she was switched to Haldol with good resolution of her  hallucinations.   PAST PSYCHIATRIC HISTORY:  [name] has been on Depakote for  antiseizure's.  She also reports that the Depakote has worked well when  combined with Prozac 60 mg q.a.m. for antidepression.  Please see the  discussion of Risperdal versus Haldol above.   Patient has had a number of psychiatric admissions to [facility], including January of this year as well as  October of 2007.  A diagnosis of bipolar disorder has been  mentioned in  the past medical records; however, the patient also states that she has  experienced auditory hallucinations while her mood is stable and within  normal limits.  She has attempted suicide.   FAMILY PSYCHIATRIC HISTORY:  None known.   SOCIAL HISTORY:  Religion is holiness.  She is married.  Education:  Engineer, agricultural.  She has a history of two prior marriages.  Children:  Five.  Occupation:  Unemployed.   She has a history of abusing over-the-counter narcotics as well as  benzodiazepines.   GENERAL MEDICAL PROBLEMS:  1. Status post asystole due to unknown causes.  2. Chronic back pain.  3. Heart murmur.  4. Seizure disorder.   MEDICATIONS:  The MAR is reviewed.  The patient is currently on Ativan 1-  4 mg q.1h. p.r.n.   She is allergic to PENICILLIN, IMITREX, PROPOXYPHENE, __________ and  AMITRIPTYLINE.   LABORATORY DATA:  WBC 11.9, hemoglobin 11.7, platelet count 191.  ESR is  20.  INR 1.2.  Metabolic panel shows the BUN at 10, creatinine 0.48,  bilirubin 0.6, SGOT  86, SGPT 39, albumin 2.8, calcium 7.9.  TSH was  within normal limits at 0.558.  Urine HCG is negative.  Depakote level  on admission was 35.6.  Nordiazepam was negative.  Ethyl alcohol  negative.  Serum drug screen was positive for benzodiazepines, including  oxazepam, positive for opiates.   Head CT without contrast on March 2nd showed a negative image.   REVIEW OF SYSTEMS:  CONSTITUTIONAL:  Afebrile.  HEAD:  Eyes:  No visual  changes.  Ears:  No hearing impairment.  Nose:  No rhinorrhea.  Mouth/Throat:  No sore throat.  NEUROLOGIC:  As above.  PSYCHIATRIC:  As  above.  CARDIOVASCULAR:  No chest pain, palpitations, or edema.  RESPIRATORY:  No coughing or wheezing.  GASTROINTESTINAL:  No nausea,  vomiting, diarrhea.  GENITOURINARY:  No dysuria.  SKIN:  Unremarkable.  ENDOCRINE/METABOLIC:  Unremarkable.  HEMATOLOGIC/LYMPHATIC:  Unremarkable.  MUSCULOSKELETAL:  No deformities.   PHYSICAL  EXAMINATION:  VITAL SIGNS:  Temperature 99.4, heart rate 98,  respiration rate 17, blood pressure 114/75.  O2 saturation on room air  96%.  MENTAL STATUS:  [name] is a middle-aged female appearing her  chronologic age.  Reclining in a partially supine position in her  hospital chair with good eye contact.  Her psychomotor tone is generally  slowed, and she obviously is fatigued.  Her affect is mildly flat.  Her  mood is slightly depressed.  Thought process is logical, coherent, goal-  directed.  No looseness of associations and thought contents.  No  thoughts of harming herself.  No thoughts of harming others.  No  delusions.  No hallucinations.  Fund of knowledge and intelligence are  within normal limits.  She is oriented completely to the year, month,  day of the month, day of the week, time of day, place, and person.  Her  memory is intact to immediate, recent, and remote except for the period  of asystole.  Her judgment is intact.  Her insight is partial.  Concentration is mildly decreased.  Her speech involves normal prosody.  Rate is slightly slow.  She is well groomed.   ASSESSMENT:   AXIS I:  1. Psychotic disorder, not otherwise specified.  293.82.  2. Rule out schizo-affective disorder.  295.70.  3. Polysubstance dependence.  4. Anxiety disorder, not otherwise specified.  293.84.   AXIS II:  Deferred.   AXIS III:  See general medical problems.   AXIS IV:  General medical, primary support group.   AXIS V:  55.   [name] is not at risk to harm herself or others.  She agrees to call  immediately for thoughts of harming herself, thoughts of harming others,  or distress.  The undersigned supported ego-supportive psychotherapy and  education.  The indications, alternatives, and adverse of Prozac,  Depakote, and Haldol were discussed with the patient, including other conventional antipsychotic options.  The discussion included the risk of  a nonreversible movement and  hyperglycemia associated with  antipsychotics.  The patient understands the above information and would  like to proceed with all three medications.   RECOMMENDATIONS:  1. Restart Depakote and titrate to the previous affective dosage of      500 mg b.i.d.  2. Would restart Prozac at 10 mg q.a.m. for antidepression.  3. The undersigned will review the conventional antipsychotic options      for the patient, given that Risperdal was not effective for      hallucinations and Haldol was.  4.  A cardiology consult is being obtained that will include      considerations of optimal antipsychotic therapy, given the      patient's history of asystole; however, the cause of asystole is      not determined at this time.  5. Given that the patient's QTC on EKG is now less than 450, her      options for antipsychotics are broad; however, will await further      consultation until the antipsychotic is started.  6. Would utilize Ativan as written p.r.n. anxiety, agitation.      Antonietta Breach, M.D.  Electronically Signed     JW/MEDQ  D:  08/27/2006  T:  08/27/2006  Job:  [id number]
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
## 2010-11-11 NOTE — Discharge Summary (Signed)
[name], [name]                 ACCOUNT NO.:  [id number]   MEDICAL RECORD NO.:  [id number]          PATIENT TYPE:  INP   LOCATION:  A323                          FACILITY:  [facility]   PHYSICIAN:  Osvaldo Shipper, MD     DATE OF BIRTH:  12/16/1970   DATE OF ADMISSION:  08/15/2005  DATE OF DISCHARGE:  02/23/2007LH                                 DISCHARGE SUMMARY   The patient does not have a primary care physician as of yet. However, she  would like to see Dr. Renard Matter or Dr. Margo Aye.   Her OB/GYN physician is Dr. Lisette Grinder.   DISCHARGE DIAGNOSES:  1.  Abdominal pain, unclear etiology. Possibly irritable bowel syndrome.  2.  History of seizure disorder.  3.  History of anxiety disorder.  4.  History of left breast mass requiring follow-up.   Please review H&P dictated at the time of admission for details regarding  the patient's present illness.   BRIEF HOSPITAL COURSE:  Briefly, this is a 40 year old female who presented  to the emergency room with complaints of abdominal pain, mostly right upper  quadrant along with nausea and vomiting. The patient underwent a  comprehensive workup in the ED including a UA which was negative for  infection. CAT scan of the abdomen and pelvis was also unremarkable for any  acute or chronic pathology. The patient's pain was controlled with Dilaudid  and she was admitted to undergo an ultrasound the following morning to rule  out cholecystitis. The patient subsequently underwent ultrasound the  following day which did rule out cholecystitis. Her kidneys were normal in  size and other organs were all normal. CBD was normal caliber. The patient  continued to have abdominal symptoms; hence, gastroenterology service was  consulted. They recommended getting a HIDA scan. Because of the patient  getting Dilaudid the HIDA scan could not be done as planned and so the  patient underwent an EGD.  The EGD essentially showed erosive esophagitis  with small hiatal  hernia. It also showed gastritis with antral scar.  However, it was felt that these findings do not explain the patient's  symptoms. Hence, once again HIDA scan was rescheduled which was done without  any emptying process which essentially showed patent biliary tree. Dr.  Karilyn Cota reviewed the HIDA scan. The gallbladder was visualized in 30 minutes.  EF was not done because the patient was on Dilaudid. However, he felt that  there was not sufficient data at this time to blame the gallbladder. Hence,  surgical evaluation was not recommended by Dr. Karilyn Cota. He recommended  continuing PPI and starting the patient on Bentyl. I discussed the above  findings with the patient. I am told that she has had an extensive workup to  delineate the cause for her abdominal pain and no significant etiology has  been found. In view of the above, the patient has been told that she will be  discharged today. She seems to agree with the above plan. She has been told  that if she continues to have these symptoms she needs to seek attention  again. She agrees.   The patient also has a history of left breast mass for which she had a  mammogram and an ultrasound back in 2003. It was recommended that the  patient undergo repeat studies six months from then; however, she has not  been able to do so. We have scheduled the patient for a repeat mammogram and  this is being scheduled for March 21st at 8 a.m. At that point if no  abnormality is found they will immediately schedule the patient for an  ultrasound. The patient has also been told to seek an appointment with Dr.  Lisette Grinder as soon as possible for this problem.   Her other medical issues including her seizure problem and anxiety disorder  all remain stable.   DISCHARGE MEDICATIONS:  1.  Bentyl 10 mg p.o. t.i.d.  2.  Protonix 40 mg p.o. daily.  3.  Reglan 5 mg p.o. q.a.c./q.h.s.  4.  Vicodin 7.5/500 one to two tablets p.o. q.4-6h. p.r.n. pain.   Otherwise she  may continue her other medications as before. Please see HPI.   DIET:  The patient may have a regular diet.   PHYSICAL ACTIVITY:  No restrictions.   FOLLOWUP:  1.  With PMD, Dr. Renard Matter or Dr. Margo Aye within four to five weeks.  2.  Dr. Lisette Grinder as soon as possible.  3.  Dr. Karilyn Cota in two to three weeks.   IMAGING STUDIES:  CAT scan of the abdomen and pelvis, ultrasound of the  abdomen, and a HIDA scan all discussed above.   CONSULTATIONS:  Obtained from gastroenterologist, Dr. Karilyn Cota.      Osvaldo Shipper, MD  Electronically Signed     GK/MEDQ  D:  08/18/2005  T:  08/18/2005  Job:  [id number]   cc:   Lionel December, M.D.  [id number]  [place]  [place] [address]   Langley Gauss, MD  Fax: [phone]   Angus G. Renard Matter, MD  Fax: [phone]
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
## 2010-11-11 NOTE — Consult Note (Signed)
NAMELATRICA, [name]                 ACCOUNT NO.:  [id number]   MEDICAL RECORD NO.:  [id number]          PATIENT TYPE:  INP   LOCATION:  1438                         FACILITY:  [facility]   PHYSICIAN:  Gerrit Friends. Dietrich Pates, MD, FACCDATE OF BIRTH:  1971/06/03   DATE OF CONSULTATION:  08/28/2006  DATE OF DISCHARGE:                                 CONSULTATION   REFERRING PHYSICIAN:  Casimiro Needle B. Sherene Sires, MD.   HISTORY OF PRESENT ILLNESS:  A 40 year old woman transferred to [facility] Emergency Department from [facility] a few days ago due to  unresponsiveness and respiratory insufficiency.  Consultation now  requested to exclude a cardiac contribution to her acute illness.   Ms. Westry has no significant past cardiac history.  She has never  previously been evaluated by a cardiologist nor undergone any  significant cardiac testing.  She was told of a murmur as a child, but  this was thought to be benign.  She has had problems with narcotics and  benzodiazepine abuse and was admitted to [facility] for  recurrence of this problem plus psychosis with auditory hallucinations.  Risperdal was utilized but was not thought to be effective.  She was  subsequently treated with Haldol and her auditory hallucinations  cleared; however, she was found unresponsive and cyanotic.  Intubation  was undertaken but it is not entirely clear by whom.  Her heart rate was  reported as tachycardic at the time she was unresponsive.  soon after  intubation, she was said to have a heart rate in the 40s.  An external  pacemaker was applied, but capture was not documented; in fact, I do not  have information about blood pressure or the adequacy of her pulses.  In  the emergency department, the ET tube was withdrawn and repositioned.  Her initial blood gas showed a marked respiratory acidosis.  Initial  laboratory showed mildly elevated creatinine and a potassium of 6.7.  Initial heart rate was adequate despite  these metabolic abnormalities.  A subsequent blood gas was improved, as was a subsequent potassium.  Cardiac markers were normal in the emergency department except for a  slightly elevated troponin of 0.08.  The following day, CPK was markedly  elevated above 3000 with 7% MB fraction.  The patient received  bicarbonate in the emergency department.  No other treatment for  hyperkalemia is apparent.  She was also treated transiently with  dopamine.  It is unclear whether this was for hypotension or  bradycardia.  No bradycardia is reported in the ED, but neither is any  hypotension.   PAST MEDICAL HISTORY:  1. Notable for migraine headaches.  2. A questionable seizure disorder.  3. Chronic back pain.  4. Anxiety.  5. Bipolar psychosis.  6. She has had left and right knee arthroscopic surgery as well as      cesarean section X 2.   SOCIAL HISTORY:  Lives in [place] with her third husband.  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  Continues to smoke at the rate of two  packs per day with a total consumption of  40 pack years.  Denies  excessive use of alcohol.   FAMILY HISTORY:  Mother is deceased and had bipolar disorder.  Father is  alive and well.  Of four siblings, one has bipolar disorder.   REVIEW OF SYSTEMS:  Notable for premenopausal status with her last  menses occurring approximately a week ago.  She describes some  impairment in hearing.  She has chronic nasal discharge.  She has  dentures.  She requires corrective lenses for near vision.  She reports  mild dyspnea on exertion.  All other systems reviewed and are negative.   PHYSICAL EXAMINATION:  GENERAL:  On exam, a pleasant, proportionate  woman.  VITAL SIGNS:  The temperature is 98.3, heart rate 85 and regular,  respirations 18, blood pressure 110/70, weight 63.1 kg.  O2 saturation  98% on room air.  HEENT:  Anicteric sclerae; normal lids and conjunctivae.  NECK:  No jugular venous distension; normal carotid upstrokes  without  bruits; scarring on the anterior surface of the neck.  SKIN:  A tattoo over the right wrist.  LUNGS:  Decreased breath sounds at the bases; no rales appreciated.  CARDIAC:  Normal first and second heart sounds; modest systolic murmur.  ABDOMEN:  Soft and nontender; no organomegaly.  ENDOCRINE:  No thyromegaly.  HEMATOPOIETIC:  No adenopathy.  ABDOMEN:  Soft and nontender; no masses; no organomegaly.  EXTREMITIES:  Normal distal pulses; no edema.  NEUROMUSCULAR:  Symmetric strength and tone; normal cranial nerves.   EKG:  Normal sinus rhythm; normal intervals; within normal limits.   IMPRESSION:  Ms. Petrea presents with respiratory insufficiency,  hyperkalemia, and mild renal insufficiency.  The exact sequence of  events surrounding her unresponsiveness is not entirely clear.  There is  little to suggest a primary cardiac event.  Haldol has been associated  in rare instances, usually with high-dose intravenous therapy, with long  QT syndrome and ventricular arrhythmias.  If this drug is to be  restarted, it would be prudent to do so in a monitored setting.   Her echocardiogram excludes significant structural heart disease.  Although MB fraction of her total CK was elevated, the likely source for  these abnormal lab findings is muscle trauma and metabolic disturbance.  We have no information as to whether CPR was undertaken.  We will obtain  a troponin level at this late date to exclude a very high value, but I  do not think she presented with an acute coronary syndrome.  Her  likelihood of coronary disease is extremely low.  We will also check a  lipid profile.  At this point, I would not recommend further cardiac  testing or treatment.      Gerrit Friends. Dietrich Pates, MD, [facility]  Electronically Signed     RMR/MEDQ  D:  08/28/2006  T:  08/28/2006  Job:  [id number]

## 2010-11-11 NOTE — Discharge Summary (Signed)
[name], [name]                 ACCOUNT NO.:  [id number]   MEDICAL RECORD NO.:  [id number]          PATIENT TYPE:  IPS   LOCATION:  0506                          [facility]ACILITY[facility]  [facility]   PHYSICIAN:  Anselm Jungling, MD  DATE OF BIRTH:  1971/02/25   DATE OF ADMISSION:  08/29/2006  DATE OF DISCHARGE:  08/30/2006                               DISCHARGE SUMMARY   IDENTIFYING INFORMATION:  This is a 40 year old married white female.  This was a voluntary admission.   HISTORY OF PRESENT ILLNESS:  This patient was transferred back to [facility] after an intervening admission to the  medical service from August 25, 2006 to August 29, 2006 after she was found  in cardiopulmonary arrest here on our unit.  She has a history of  depression with auditory hallucinations with a lot of derogatory remarks  and benzodiazepine dependence and was being treated for exacerbation of  her depression and psychosis.  She had been found unresponsive and  cyanotic.  Please refer to the full discharge summary dictated by Dr.  Sandrea Hughs on the cardiology service.  The patient was diagnosed with  an acute cardiopulmonary arrest with ventilator-dependent respiratory  failure and also was diagnosed with some bilateral lower lobe  atelectasis which was resolving.  She was medically cleared by  cardiology and while there had a psychiatric consult support by Dr.  Antonietta Breach.  Today, she has presented on the unit.  Affect is  bright.  She is fully alert.  No suicidal ideation.  She feels quite  good after her admission.  Says she plans to live life to the fullest  and she is requesting to be discharged.  Denies that she is currently  having any auditory hallucinations and denies suicidal thoughts.  We  reviewed the patient's medications which did receive some adjustment on  the medicine unit.   CURRENT MEDICATIONS:  Prozac 10 mg daily, Depakote ER 500 mg p.o.  nightly and Protonix 40 mg  daily   ALLERGIES:  PENICILLIN, IMITREX, DARVOCET and AMITRIPTYLINE.   MENTAL STATUS EXAM:  Fully alert female, pleasant, cooperative with  bright affect, appropriate.  Grooming is good.  Hygiene is good.  Speech  is normal in pace, tone and production, fluent and relevant.  Mood is  euthymic.  Thought process logical and coherent.  No auditory  hallucinations in the past 24-48 hours.  No suicidal thoughts.  She  commits to following up as an outpatient and continuing to take her  medications.  Feels that she is safe to be discharged and we are in  agreement.  Cognition is intact.  She did receive discharge instructions  from the medicine service about ongoing information about the pulmonary  toilet and medical follow-up.   DISCHARGE DIAGNOSES:  AXIS I:  Major depression, recurrent, severe with  psychosis.  Rule out post-traumatic stress disorder.  Polysubstance  dependence.  Anxiety disorder.  AXIS II:  Deferred.  AXIS III:  Status post acute cardiopulmonary arrest with ventilator-  dependent respiratory failure, bilateral lower lobe atelectasis, status  post  cardiopulmonary arrest, had some mild elevation in liver functions,  therefore Depakote was decreased.   PLAN:  To discharge the patient today.   DISCHARGE MEDICATIONS:  1. Prozac 40 mg daily.  2. Depakote ER 250 mg twice daily.  3. Valium 5 mg twice a day.  4. Risperdal 0.5 mg up to t.i.d. as needed for anxiety.  5. Albuterol inhaler as previously instructed.      [name], N.P.      Anselm Jungling, MD  Electronically Signed    MAS/MEDQ  D:  08/31/2006  T:  08/31/2006  Job:  [id number]
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
## 2010-11-11 NOTE — H&P (Signed)
[name]                 ACCOUNT NO.:  [id number]   MEDICAL RECORD NO.:  [id number]          PATIENT TYPE:  INP   LOCATION:  A203                          FACILITY:  [facility]   PHYSICIAN:  Margaretmary Dys, M.D.DATE OF BIRTH:  Apr 09, 1971   DATE OF ADMISSION:  08/15/2005  DATE OF DISCHARGE:  LH                                HISTORY & PHYSICAL   PRIMARY CARE PHYSICIAN:  The patient is unassigned.   ADMITTING DIAGNOSES:  1.  Acute right-sided upper abdominal pain.  2.  Anxiety.  3.  Constipation.   CHIEF COMPLAINT:  Right upper quadrant abdominal pain of one day's duration.   HISTORY OF PRESENT ILLNESS:  [name] is a 40 year old Caucasian  female who presents to the emergency room with complaints of abdominal pain,  mostly right upper quadrant.  She describes the pain as a 8-10/10 in initial  onset and came on suddenly today.  The pain also radiates to her back.  The  pain is associated with some nausea and some vomiting.  She says she has  also had a lot of burping.  She denies any fever or chills.  She says she  has been constipated for a few days now and when she tried to go today in  the emergency room, she felt it was quite hard.  She denies any diarrhea.  She has no frequency, urgency, or dysuria.  She denies any fever or chills.  No headaches, dizziness, or lightheadness.  The patient has never had this  kind of pain previously.   Initial evaluation in the emergency room with a CT scan of the abdomen and  pelvis did not reveal any significant abnormality.  The patient is being  admitted now for further evaluation on ultrasound in the morning and  possible help with bowel movements to see if that would relieve her pain.   REVIEW OF SYSTEMS:  A 10-point review of systems was negative except as  mentioned in the history of present illness.   PAST MEDICAL HISTORY:  1.  Anxiety.  2.  Seizure disorder.  3.  Depression.   ALLERGIES:  The patient reports  allergies to PENICILLIN, IMITREX, and  DARVOCET.   MEDICATIONS:  1.  Depakote 250 mg p.o. q.a.m. and 500 mg p.o. q.p.m.  2.  Klonopin 1 mg p.o. t.i.d.  3.  Phenergan 25 mg p.o. q.4 h. as needed.  4.  Vicodin 1-2 tabs p.o. q.4 h. as needed.  5.  Maxalt 10 mg p.o. once a day as needed.   SOCIAL HISTORY:  The patient is recently married about 5-6 months ago.  She  has 5 children alive and well.  She smokes about a pack of cigarettes a day.  She has a 12th grade education and is currently unemployed.   FAMILY HISTORY:  Unremarkable for any significant illness.   PHYSICAL EXAMINATION:  The patient was conscious, alert, appeared anxious,  not in acute distress, well oriented to time, place, and person.  HEENT:  Exam normocephalic, atraumatic.  Oral mucosa was moist with no  exudates.  The  patient was totally edentulous.  NECK:  Supple with no JVD or lymphadenopathy.  LUNGS:  Clear clinically with good air entry bilaterally.  HEART:  S1, S2, regular, no S3, S4, gallops or rubs.  ABDOMEN:  Soft, nontender, bowel sounds positive, no masses palpable.  There  was no morphous sign.  No distention was noted.  EXTREMITIES:  No pitting pedal edema, no calf induration or tenderness was  noted.  CENTRAL NERVOUS SYSTEM:  Exam was grossly intact with no focal deficits.   LABORATORY DATA:  White blood cell count was 7.6, hemoglobin of 14.8,  hematocrit 43.6.  Platelet count was 298.  There was no left shift.  Sodium  134, potassium 4.3, chloride of 98, CO2 30, glucose 86, BUN of 7, creatinine  0.7.  AST and ALT were normal.  Lipase was normal at 15.  Urine pregnancy  test was negative.  The patient noted that she is status post bilateral  tubal ligation.   Urinalysis was negative.  Urine microscopy was negative.  CT scan of the  abdomen and pelvis did not reveal any significant abnormality with the  liver, spleen, pancreas.  The patient was noted to have some constipation.  There was no free  fluid, free air, or adenopathy noted.  Appendix was  normal.   ASSESSMENT AND PLAN:  [name] is a 40 year old Caucasian female who  presented to the emergency room with acute onset of right upper quadrant  abdominal pain radiating to her back.  The pain seems to be fairly better  controlled now that she is on Dilaudid.  Our plan is to continue with pain  control.  She will be admitted so we can do an ultrasound of her tomorrow  morning to further evaluate her gallbladder tree.  Her pain may be entirely  related to bowel gas secondary to her constipation noted on CT scan.  We  will also put her on a bowel regimen while she is here.   We will per her on GI prophylaxis with Protonix.  I will hold any DVT  prophylaxis with Lovenox or heparin for now in case the patient will have to  have surgery.  She will be on SCDs.   I will resume all her home medications at this time.  I have discussed the  full plan with her and she verbalized full understanding.      Margaretmary Dys, M.D.  Electronically Signed     AM/MEDQ  D:  08/16/2005  T:  08/16/2005  Job:  [id number]

## 2010-11-11 NOTE — Discharge Summary (Signed)
NAMESELENNE, [name]                 ACCOUNT NO.:  [id number]   MEDICAL RECORD NO.:  [id number]          PATIENT TYPE:  IPS   LOCATION:  0306                          [facility]ACILITY[facility]  [facility]   PHYSICIAN:  Anselm Jungling, MD  DATE OF BIRTH:  [birth year]   DATE OF ADMISSION:  08/21/2006  DATE OF DISCHARGE:  08/25/2006                               DISCHARGE SUMMARY   IDENTIFYING DATA/REASON FOR ADMISSION:  This was one of several [facility]  admissions for [name], a 40 year old married white female who returned  with a complaint of depression, suicidal ideation, and auditory  hallucinations.  She also admitted to relapsing on narcotic analgesics  and benzodiazepines.  Please refer to the admission note for further  details pertaining to the symptoms, circumstances and history that led  to her hospitalization.   HOSPITAL COURSE:  The patient was readmitted to the adult inpatient  psychiatric service.  She had come to [place] on a regimen of Depakote,  Prozac, and Risperdal.  Her medications were adjusted, with changes  involving discontinuation of Risperdal, with a trial of Haldol.  The  patient apparently had a severe allergic response to HALDOL, which led  to respiratory arrest, and immediate transfer to [facility] for  medical treatment, on August 25, 2006, the sixth hospital day.  Following  that hospital treatment, she returned to our inpatient unit on August 29, 2006, and continued her psychiatric treatment at that time.  Please  refer to admission and discharge summaries for that readmission for  further information pertaining to her overall hospital course.      Anselm Jungling, MD  Electronically Signed     SPB/MEDQ  D:  10/19/2006  T:  10/19/2006  Job:  [id number]
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
## 2010-11-11 NOTE — Op Note (Signed)
NAMECRYSTALLYNN, [name]                 ACCOUNT NO.:  [id number]   MEDICAL RECORD NO.:  [id number]           PATIENT TYPE:  INP   LOCATION:  A323                          FACILITY:  [facility]   PHYSICIAN:  Lionel December, M.D.    DATE OF BIRTH:  1971/03/22   DATE OF PROCEDURE:  DATE OF DISCHARGE:                                 OPERATIVE REPORT   PROCEDURE:  Esophagogastroduodenoscopy.   HISTORY:  [name] is a 40 year old Caucasian female who presents with sudden  onset of right upper quadrant/epigastric pain whose lab studies have been  normal. She had ultrasound, yesterday, which was also normal.  We were  planning HIDA scan prior to EGD, but HIDA scan had to be postponed. She  remains with severe pain. She is undergoing EGD to find out whether she has  peptic ulcer disease to account for her symptomatology. Procedure and risks  were reviewed with the patient and informed consent was obtained.   MEDICINES FOR CONSCIOUS SEDATION:  Cetacaine spray pharyngeal topical  anesthesia Demerol 25 mg IV Versed 10 mg IV.   FINDINGS:  Procedure performed in endoscopy suite. The patient's vital signs  and O2 saturation were monitored during the procedure and remained stable.  The patient was placed in the left lateral position and Olympus videoscope  was passed via oropharynx without any difficulty into esophagus.   ESOPHAGUS:  Mucosa of the esophagus was normal. The GE junction was  wavy/serrated with a single linear erosion. GE junction was at 36 and hiatus  was at 39. There was small sliding hiatal hernia.   STOMACH:  It was empty and distended very well insufflation. Folds of the  proximal stomach were normal. Examination of mucosa revealed a linear scar  at antrum surrounded by erythematous mucosa. There was patchy erythema and  granularity involving the antral mucosa. Pyloric channel was patent.  Angularis, fundus, and cardia were examined by retroflexing the scope. There  was focal erythema at the  angularis.  The fundus and cardia were normal.   DUODENUM:  Bulbar mucosa was normal. Scope was passed into the second part  of the duodenum where mucosa and folds were normal. Endoscope was withdrawn.  The patient tolerated the procedure well.   FINAL DIAGNOSIS:  1.  Small sliding hiatal hernia with single erosion at GE junction.  2.  Antral scar with associated gastritis, possibly due to ongoing      Helicobacter pylori infection.  3.  However, no evidence of peptic ulcer disease to account for the      patient's symptoms.   PLAN:  Will check her H. pylori serology in a.m. and also repeat her LFTs.  She is already scheduled to undergo HIDA scan in a.m.      Lionel December, M.D.  Electronically Signed     NR/MEDQ  D:  08/17/2005  T:  08/17/2005  Job:  [id number]   cc:   Margaretmary Dys, M.D.

## 2010-11-11 NOTE — Consult Note (Signed)
[name], [name]                 ACCOUNT NO.:  [id number]   MEDICAL RECORD NO.:  [id number]          PATIENT TYPE:  INP   LOCATION:  1438                         FACILITY:  [facility]   PHYSICIAN:  Antonietta Breach, M.D.  DATE OF BIRTH:  03-16-1971   DATE OF CONSULTATION:  08/29/2006  DATE OF DISCHARGE:  08/29/2006                                 CONSULTATION   [name] continues with depressed mood, low energy, decreased  concentration.  She also has mood congruent auditory hallucinations,  they are not command destructive.  She has had no further cardiovascular  problems.  She is socially appropriate and cooperative with care.   LABORATORY DATA:  A WBC 6.6, hemoglobin 11,  platelet count 195,000.  Bilirubin total 0.8.  The SGOT is 136, SGPT 148, these are decreased  from the 4th of March.   EXAMINATION:  VITAL SIGNS:  Temperature 98.2, pulse 75, respirations 20,  blood pressure 101/63, O2 saturation on room air 95%.   MENTAL STATUS EXAM:  [name] is alert, she is oriented to all spheres.  Her speech is within normal limits.  Thought process is logical,  coherent, goal-directed, no looseness of associations.  THOUGHT CONTENT:  Please see the history above.  JUDGMENT:  Intact with insight intact for the need of psychiatric  treatment.  Her affect is very constricted, her mood is depressed.   ASSESSMENT:  Axis I:  1. 293.83, mood disorder not otherwise specified, depressed      (functional and general medical factors).  2. 293.82, psychotic disorder not otherwise specified.  3. Rule out schizoaffective disorder.   The undersigned discussed the indications, alternatives and adverse  effects of Zyprexa with the patient for antipsychosis as well as acute  mood stabilization, including the risk of a nonreversal movement, weight  gain and hyperglycemia.  The discussion also included the possibility  that Haldol had resulted in a malignant arrhythmia through an elevation  of the QTc  interval then followed by asystole.  This was explained in  lay terms.  It was also explained that it could not be known that Haldol  was involved in her asystole for sure.  The patient understands the  above information as well as the limited knowledge that is available  about QTc prolongation for antipsychotics.  However, given the data  accumulated so far she would like to undergo treatment with Zyprexa for  antipsychosis and potentially acute mood stabilization.  SHE WOULD ALSO  LIKE TO HAVE HALDOL LISTED AS A DRUG ALLERGY.   RECOMMENDATION:  1. Would admit the patient to a psychiatric ward for further      evaluation and treatment given her psychotic      depression and inability to perform ADLs.  2. Will defer psychotropics at this time; however, the patient has      been given informed consent regarding her next antipsychotic trial      as mentioned above.      Antonietta Breach, M.D.  Electronically Signed     JW/MEDQ  D:  08/30/2006  T:  08/30/2006  Job:  [id number] 

## 2010-11-11 NOTE — Op Note (Signed)
NAMEVIVYAN, [name]                 ACCOUNT NO.:  [id number]   MEDICAL RECORD NO.:  [id number]          PATIENT TYPE:  AMB   LOCATION:  DAY                           FACILITY:  [facility]   PHYSICIAN:  J. Darreld Mclean, M.D. DATE OF BIRTH:  Mar 13, 1971   DATE OF PROCEDURE:  10/05/2005  DATE OF DISCHARGE:                                 OPERATIVE REPORT   PREOPERATIVE DIAGNOSIS:  Tear of medial meniscus, left knee.   POSTOPERATIVE DIAGNOSIS:  Tear of medial meniscus, left knee.   OPERATION PERFORMED:  Operative arthroscopy of the left knee, partial medial  meniscectomy using a laser.   SURGEON:  J. Darreld Mclean, M.D.   ANESTHESIA:  General.   TOURNIQUET TIME:  26 minutes.   DRAINS:  None.   INDICATIONS FOR PROCEDURE:  A 40 year old female with pain and tenderness in  the left knee.  MRI shows tear of the medial meniscus, left knee.  Risks and  imponderables of the procedure were discussed preoperatively with the  patient.  She appeared to understand and agreed to procedure as outlined.   DESCRIPTION OF PROCEDURE:  The patient was seen in the holding area.  The  left knee was identified as the correct surgical site.  She had placed a  mark on the left knee.  I placed a marker on the left knee.  She was brought  back to the operating room.  She was given general anesthetic while supine.  Leg holder and tourniquet placed deflated on left upper thigh.  She was  prepped and draped in the usual manner.  We had a time out and we identified  [name] as the patient and the left knee as the correct surgical site.  Leg was elevated, wrapped circumferentially with Esmarch bandage, tourniquet  inflated to 300 mmHg, Esmarch bandage removed.  Inflow cannula inserted  medially.  Lactated ringers inserted in the knee by an infusion pump.  Arthroscope inserted laterally.  Knee systematically examined.  Suprapatellar pouch looked normal.  Undersurface of the patella there are  just early grade 2  changes.  Medially there was a tear of the posterior horn  of the medial meniscus, stellate type tear.  There were early grade 2  changes.  Anterior cruciate was intact.  Laterally, it looked very good.  No  loose bodies.   Attention was then directed to medial side of the knee.  Using a meniscal  punch, meniscal shaver and the laser, good smooth contour was obtained.  Knee was systemically re-examined and no pathology found.  Wound was  irrigated with remaining part of lactated Ringers.  Wound was reapproximated  using 3-0 nylon interrupted vertical mattress  manner.  Tourniquet deflated after 26 minutes.  Sterile dressing applied,  bulky dressing applied.  Knee immobilizer applied.  Patient already has  prescription for Vicodin ES for pain.  See her in the office in  approximately two weeks.  Physical therapy has been arranged.  She knows she  can contact me through the office or hospital beeper system.  ______________________________  Shela Commons. Darreld Mclean, M.D.     JWK/MEDQ  D:  10/05/2005  T:  10/05/2005  Job:  [id number]

## 2010-11-11 NOTE — Discharge Summary (Signed)
[name], [name]                 ACCOUNT NO.:  [id number]   MEDICAL RECORD NO.:  [id number]          PATIENT TYPE:  INP   LOCATION:  1438                         FACILITY:  [name]   PHYSICIAN:  Norma Needle B. Sherene Sires, MD, FCCPDATE OF BIRTH:  07-10-1970   DATE OF ADMISSION:  08/25/2006  DATE OF DISCHARGE:  08/29/2006                               DISCHARGE SUMMARY   DISCHARGE DIAGNOSES:  1. Status post acute cardiopulmonary arrest with ventilator-dependent      respiratory failure (resolved).  2. Bilateral lower lobe atelectasis (resolving).  3. Status post cardiopulmonary arrest (clear by cardiology and      medically cleared for discharge.  4. Elevated liver function tests.  5. Polysubstance dependence.  6. Anxiety disorder.  7. History of psychotic disorder and concern for schizoaffective      disorder.   LABORATORY DATA:  August 29, 2006: Total bilirubin 0.8, direct bilirubin  0.1, indirect bilirubin 0.7, alkaline phosphatase 65, AST 136, ALT 148,  total protein 5.1, albumin 2.6.  CK 2698, CK-MB 3.5,  relative index  1.1, troponin I 0.08. This was obtained on August 29, 2006, at 5:40 a.m.  This is in comparison to CK-MB obtained on August 26, 2006, demonstrating  CK of 520, CK-MB of 36.2 with relative index of 7.0 and a troponin I of  0.63.  Then further followup on August 28, 2006, with a troponin I that  was 0.12.   Lipid profile on August 29, 2006: Cholesterol 139, triglycerides 67, HDL  62, LDL 64.  August 29, 2006, white blood cell count 6.6, hemoglobin 11,  hematocrit 31.5, platelet count 197.  August 26, 2006,  Sputum obtained on  August 26, 2006, demonstrated abundant gram-positive cocci in pairs with  abundant staph aureus.  (This was pansensitive.)   White blood cell count on August 29, 2006, 6.6, hemoglobin 11, hematocrit  31.5, platelet count 197.  August 28, 2006, white blood cell count 10.3,  hemoglobin 11.1, hematocrit 32.3, platelet count 181.   RADIOLOGY:  Chest x-ray August 28, 2006:  Bilateral lower lobe airspace  disease with right lower lobe significantly improved compared to prior  film, mild left lower lobe infiltrate likely atelectasis.   BRIEF HISTORY:  A 40 year old woman transferred to [name] from  [name] on July 23, 2006, after cardiopulmonary arrest.  Per chart report, she had been initially admitted to [name] for detox and assistance with narcotic and benzodiazepine abuse.  Apparently she had some history with psychosis and auditory  hallucinations in the past.  Per chart report, she was found  unresponsive and cyanotic.  She was intubated.  Her heart rate was  reported as tachycardic, then initially said to have a heart rate in the  40s after that.  Per chart report, she had an external pacemaker  applied, but it was not clear as to whether or not she had adequate  capture.  It appears as though spontaneous return of perfusing pulses  occurred following endotracheal intubation, consistent with respiratory  arrest, contributing to cardiopulmonary arrest.   HOSPITAL COURSE:  #1.  STATUS POST ACUTE CARDIOPULMONARY ARREST WITH VENTILATOR-DEPENDENT  RESPIRATORY FAILURE:  The patient was admitted to [name].  She was placed on mechanical ventilation.  As her mental status  improved, she was successfully extubated on August 26, 2006.  She has  since been weaned off supplement oxygen and has been up ambulating in  hallway without significant dyspnea.  She is now on room air support.  She does have some faint rales on exam; however, she reports that her  respiratory status is at baseline.   #2.  SPUTUM POSITIVE FOR METHICILLIN SENSITIVE STAPHYLOCOCCUS AUREUS, NO  EVIDENCE OF PNEUMONIA: Currently no indication of active infection.  The  patient is afebrile, has no leukocytosis, and denies dyspnea.  Plan for  this is simply pulmonary hygiene at this point.  No antibiotics  indicated currently, if develops  purulent sputum or cough could consider  treatment based on sensitivities.   #3. BILATERAL ATELECTASIS/PULMONARY INFILTRATES:  [name] chest x-  ray has dramatically improved following aggressive pulmonary toilet  after extubation.  At time of discharge, her respiratory status is at  baseline.  Would recommend only activity and routine pulmonary toilet.  No necessary pulmonary followup indicated at this time; however, could  consider routine followup chest x-ray with primary care physician upon  discharge from [name].   #4.  ELEVATED LIVER FUNCTION TESTS:  Currently stable.  These will need  to be monitored periodically as patient is on Depakote.   #5.  POLYSUBSTANCE DEPENDENCE, ANXIETY DISORDER, PSYCHOTIC DISORDER, AND  SCHIZOAFFECTIVE DISORDER:  See pschiatric consult by Dr Jeanie Sewer on  August 27, 2006 and discharge medication regimen.   PHYSICAL EXAMINATION:  VITAL SIGNS: Temperature 98.2, heart rate 78-75,  respirations 16-20, blood pressure 101/63, saturation 95% on room air.  NECK:  No JVD or adenopathy.  PULMONARY:  Mild rales bilateral bases.  CARDIAC: Regular rate and rhythm.  EXTREMITIES:  Without edema, 2+ pulses, and brisk capillary refill.   DISCHARGE INSTRUCTIONS:   DIET:  As tolerated.   DISCHARGE MEDICATIONS:  1. Depakote 500 mg p.o. nightly.  2. Prozac 10 mg p.o. daily.  3. Protonix 40 mg p.o. daily.  4. Ibuprofen 600 mg p.o. q. 8 h p.r.n. pain.  5. Ativan 1 mg p.o. q. 4 h p.r.n. anxiety.  6. Tylenol 325 mg p.o. q. 6 h p.r.n. general discomfort.   ALLERGIES:  PENICILLIN, IMITREX, DARVOCET, and AMITRIPTYLINE.   DISPOSITION:  Currently [name] is awaiting acceptance at [name] to resume her prior detox goals, also awaiting further  recommendations from Dr. Jeanie Sewer.  Should she not be accepted at  [facility], we will seek Dr. Providence Crosby recommendations for outpatient followup and discharge under the care of her  husband.  However, the patient at this time continues to desire further therapy as  previously sought out.      Zenia Resides, NP      Charlaine Dalton. Sherene Sires, MD, [name]  Electronically Signed    PB/MEDQ  D:  08/29/2006  T:  08/29/2006  Job:  [id number]

## 2010-11-11 NOTE — H&P (Signed)
[name], [name]                 ACCOUNT NO.:  [id number]   MEDICAL RECORD NO.:  [id number]          PATIENT TYPE:  AMB   LOCATION:  DAY                           FACILITY:  [facility]   PHYSICIAN:  J. Darreld Mclean, M.D. DATE OF BIRTH:  09/19/1970   DATE OF ADMISSION:  DATE OF DISCHARGE:  LH                                HISTORY & PHYSICAL   CHIEF COMPLAINT:  My left knee hurts.   HISTORY OF PRESENT ILLNESS:  The patient is a 40 year old female with pain  and tenderness on her left knee. This has been bothering now for several  months. Obtained MRI of her left knee on September 12, 2005, which showed a  nondisplaced tear of the body and posterior horn of the medial meniscus.  There is some mild mucoid degenerative changes of the anterior cruciate. I  informed her of the findings at that time and told that she was a possible  candidate for an arthroscopy. I did a right knee arthroscopy of her knee on  March 18, 2002, and she has done well. She understands the procedure.  She has had a course of physical therapy. She has also had some problems  with her abdomen and has abdominal pain of unclear etiology. She was  admitted to the hospital on August 15, 2005, and discharged on August 18, 2005.  She was having troubles with bowels. These are doing much better  and resolving on their own. She elected to proceed now with surgery of the  knee. It is giving way and causing pain, tenderness, and swelling.   PAST HISTORY:  Positive for nervous disorder and abdominal pain. It is  unclear what the cause of abdominal pain was. It was felt that she may have  irritable bowel syndrome. The patient has a history of seizure disorder and  anxiety disorder.   PAST SURGICAL HISTORY:  She has had a cesarean section in 1994 and 1998;  knee surgery in 2003.   ALLERGIES:  PENICILLIN.   MEDICATIONS:  She is currently taking Protonix 40 mg daily, Reglan 5 mg  daily, Tylenol and Vicodin ES for pain.   SOCIAL HISTORY:  She smokes a pack of cigarettes per day. She does not use  alcoholic beverages.   FAMILY HISTORY:  She denies any diseases that run in the family.   PHYSICAL EXAMINATION:  GENERAL: The patient is alert, cooperative, and  oriented.  VITAL SIGNS: Normal.  HEENT: Negative.  LUNGS: Clear to P&A.  HEART:  Regular rate and rhythm.  ABDOMEN: Soft, nontender without masses.  EXTREMITIES: The right knee is status post arthroscopy with small puncture  wounds, no effusions, no pain. The left knee has mild effusion with pain and  tenderness medially and positive McMurray medially.  Range of motion of the  knee is full but painful. Upper extremities within normal limits.  CNS: Intact.  SKIN: Intact.   IMPRESSION:  1.  Left knee medial meniscal tear.  2.  History of stomach problems, irritable bowel-type syndrome.  3.  History of anxiety disorder.  4.  History of  seizure disorder.   PLAN:  Arthroscopy of the knee on the left, partial meniscectomy. The risks  and any problems have been discussed. The patient appears to understand and  agrees to proceed with procedure as outlined.                                            ______________________________  J. Darreld Mclean, M.D.     JWK/MEDQ  D:  09/29/2005  T:  09/29/2005  Job:  [id number]

## 2010-12-17 ENCOUNTER — Emergency Department (HOSPITAL_COMMUNITY): Payer: Self-pay

## 2010-12-17 ENCOUNTER — Emergency Department (HOSPITAL_COMMUNITY)
Admission: EM | Admit: 2010-12-17 | Discharge: 2010-12-17 | Disposition: A | Discharge status: Home / Self Care | Payer: Self-pay | Attending: Emergency Medicine | Admitting: Emergency Medicine

## 2010-12-17 DIAGNOSIS — F17200 Nicotine dependence, unspecified, uncomplicated: Secondary | ICD-10-CM | POA: Insufficient documentation

## 2010-12-17 DIAGNOSIS — R1011 Right upper quadrant pain: Secondary | ICD-10-CM | POA: Insufficient documentation

## 2010-12-17 DIAGNOSIS — R197 Diarrhea, unspecified: Secondary | ICD-10-CM | POA: Insufficient documentation

## 2010-12-17 DIAGNOSIS — K2970 Gastritis, unspecified, without bleeding: Secondary | ICD-10-CM | POA: Insufficient documentation

## 2010-12-17 DIAGNOSIS — F411 Generalized anxiety disorder: Secondary | ICD-10-CM | POA: Insufficient documentation

## 2010-12-17 DIAGNOSIS — F319 Bipolar disorder, unspecified: Secondary | ICD-10-CM | POA: Insufficient documentation

## 2010-12-17 DIAGNOSIS — F4310 Post-traumatic stress disorder, unspecified: Secondary | ICD-10-CM | POA: Insufficient documentation

## 2010-12-17 DIAGNOSIS — F209 Schizophrenia, unspecified: Secondary | ICD-10-CM | POA: Insufficient documentation

## 2010-12-17 LAB — DIFFERENTIAL
Basophils Absolute: 0 10*3/uL (ref 0.0–0.1)
Basophils Relative: 0 % (ref 0–1)
Eosinophils Absolute: 0 10*3/uL (ref 0.0–0.7)
Eosinophils Relative: 1 % (ref 0–5)
Lymphocytes Relative: 30 % (ref 12–46)
Monocytes Absolute: 0.4 10*3/uL (ref 0.1–1.0)

## 2010-12-17 LAB — COMPREHENSIVE METABOLIC PANEL
AST: 15 U/L (ref 0–37)
Albumin: 3.9 g/dL (ref 3.5–5.2)
Calcium: 9.6 mg/dL (ref 8.4–10.5)
Chloride: 102 mEq/L (ref 96–112)
Creatinine, Ser: 0.68 mg/dL (ref 0.50–1.10)

## 2010-12-17 LAB — URINALYSIS, ROUTINE W REFLEX MICROSCOPIC
Ketones, ur: NEGATIVE mg/dL
Leukocytes, UA: NEGATIVE
Nitrite: NEGATIVE
Protein, ur: NEGATIVE mg/dL
pH: 6 (ref 5.0–8.0)

## 2010-12-17 LAB — CBC
HCT: 43.3 % (ref 36.0–46.0)
MCHC: 34.6 g/dL (ref 30.0–36.0)
Platelets: 241 10*3/uL (ref 150–400)
RDW: 13.9 % (ref 11.5–15.5)
WBC: 6.2 10*3/uL (ref 4.0–10.5)

## 2010-12-24 ENCOUNTER — Emergency Department (HOSPITAL_COMMUNITY)
Admission: EM | Admit: 2010-12-24 | Discharge: 2010-12-24 | Disposition: A | Discharge status: Home / Self Care | Payer: Self-pay | Attending: Emergency Medicine | Admitting: Emergency Medicine

## 2010-12-24 DIAGNOSIS — R109 Unspecified abdominal pain: Secondary | ICD-10-CM | POA: Insufficient documentation

## 2010-12-24 LAB — POCT PREGNANCY, URINE: Preg Test, Ur: NEGATIVE

## 2010-12-24 LAB — DIFFERENTIAL
Lymphocytes Relative: 30 % (ref 12–46)
Lymphs Abs: 2.3 10*3/uL (ref 0.7–4.0)
Neutrophils Relative %: 63 % (ref 43–77)

## 2010-12-24 LAB — CBC
HCT: 41.1 % (ref 36.0–46.0)
MCV: 84.6 fL (ref 78.0–100.0)
Platelets: 235 10*3/uL (ref 150–400)
RBC: 4.86 MIL/uL (ref 3.87–5.11)
WBC: 7.7 10*3/uL (ref 4.0–10.5)

## 2010-12-24 LAB — COMPREHENSIVE METABOLIC PANEL
AST: 29 U/L (ref 0–37)
BUN: 7 mg/dL (ref 6–23)
CO2: 26 mEq/L (ref 19–32)
Chloride: 103 mEq/L (ref 96–112)
Creatinine, Ser: 0.58 mg/dL (ref 0.50–1.10)
GFR calc non Af Amer: >60 mL/min (ref >60)
Total Bilirubin: 0.3 mg/dL (ref 0.3–1.2)

## 2011-01-13 ENCOUNTER — Emergency Department (HOSPITAL_COMMUNITY)
Admission: EM | Admit: 2011-01-13 | Discharge: 2011-01-13 | Disposition: A | Discharge status: Home / Self Care | Payer: Self-pay | Attending: Emergency Medicine | Admitting: Emergency Medicine

## 2011-01-13 DIAGNOSIS — G43909 Migraine, unspecified, not intractable, without status migrainosus: Secondary | ICD-10-CM | POA: Insufficient documentation

## 2011-01-13 DIAGNOSIS — F17200 Nicotine dependence, unspecified, uncomplicated: Secondary | ICD-10-CM | POA: Insufficient documentation

## 2011-01-13 MED ORDER — DIPHENHYDRAMINE HCL 50 MG/ML IJ SOLN
50.0000 mg | Freq: Once | INTRAMUSCULAR | Status: AC
  Administered 2011-01-13: 50 mg via INTRAVENOUS
  Filled 2011-01-13: qty 1

## 2011-01-13 MED ORDER — OXYCODONE-ACETAMINOPHEN 5-325 MG PO TABS
1.0000 | ORAL_TABLET | ORAL | Status: DC | PRN
  Administered 2011-01-13: 1 via ORAL
  Filled 2011-01-13: qty 1

## 2011-01-13 MED ORDER — KETOROLAC TROMETHAMINE 30 MG/ML IJ SOLN
30.0000 mg | Freq: Once | INTRAMUSCULAR | Status: AC
  Administered 2011-01-13: 30 mg via INTRAVENOUS
  Filled 2011-01-13: qty 1

## 2011-01-13 MED ORDER — ONDANSETRON HCL 4 MG/2ML IJ SOLN
4.0000 mg | Freq: Once | INTRAMUSCULAR | Status: AC
  Administered 2011-01-13: 4 mg via INTRAVENOUS
  Filled 2011-01-13: qty 2

## 2011-01-13 MED ORDER — SODIUM CHLORIDE 0.9 % IV BOLUS (SEPSIS)
1000.0000 mL | Freq: Once | INTRAVENOUS | Status: AC
  Administered 2011-01-13: 1000 mL via INTRAVENOUS

## 2011-01-13 MED ORDER — MORPHINE SULFATE 4 MG/ML IJ SOLN
4.0000 mg | Freq: Once | INTRAMUSCULAR | Status: AC
  Administered 2011-01-13: 4 mg via INTRAVENOUS
  Filled 2011-01-13: qty 1

## 2011-01-13 MED ORDER — DEXAMETHASONE SODIUM PHOSPHATE 10 MG/ML IJ SOLN
10.0000 mg | Freq: Once | INTRAMUSCULAR | Status: AC
Start: 2011-01-13 — End: 2011-01-13
  Administered 2011-01-13: 10 mg via INTRAVENOUS
  Filled 2011-01-13: qty 1

## 2011-01-13 MED ORDER — OXYCODONE-ACETAMINOPHEN 5-325 MG PO TABS: 1.0000 | ORAL_TABLET | ORAL | Status: DC | PRN

## 2011-01-13 NOTE — ED Notes (Signed)
Pt stated her pain was down to 2/10 before last meds iven

## 2011-01-13 NOTE — ED Notes (Signed)
Pt cont to wait to be eval

## 2011-01-13 NOTE — ED Notes (Signed)
Pt states she usually takes percocet for headaches. States she ran out and does not have insurance to go to her doctor. Has been taking otc meds that are not helping

## 2011-01-13 NOTE — ED Provider Notes (Addendum)
History     Chief Complaint  Patient presents with  . Migraine   Patient is a 40 y.o. female presenting with migraine. The history is provided by the patient.  Migraine This is a recurrent problem. The current episode started in the past 7 days. The problem occurs constantly (Headache is similar to previous migraines). The problem has been unchanged. Associated symptoms include anorexia, headaches, nausea and vomiting. Pertinent negatives include no abdominal pain, arthralgias, chest pain, chills, congestion, fatigue, fever, joint swelling, myalgias, neck pain, numbness, rash, sore throat or weakness. She has tried acetaminophen for the symptoms. The treatment provided no relief.  Migraine This is a recurrent problem. The current episode started in the past 7 days. The problem occurs constantly (Headache is similar to previous migraines). The problem has been unchanged. Associated symptoms include headaches. Pertinent negatives include no chest pain, no abdominal pain and no shortness of breath. She has tried acetaminophen for the symptoms. The treatment provided no relief.    Past Medical History  Diagnosis Date  . Migraine     Past Surgical History  Procedure Date  . Abdominal hysterectomy   . Knee surgery     History reviewed. No pertinent family history.  History  Substance Use Topics  . Smoking status: Current Everyday Smoker -- 0.5 packs/day    Types: Cigarettes  . Smokeless tobacco: Not on file  . Alcohol Use: No    OB History    Grav Para Term Preterm Abortions TAB SAB Ect Mult Living                  Review of Systems  Constitutional: Negative for fever, chills and fatigue.  HENT: Negative for ear pain, congestion, sore throat, neck pain, sinus pressure and tinnitus.   Eyes: Negative.   Respiratory: Negative for chest tightness and shortness of breath.   Cardiovascular: Negative for chest pain.  Gastrointestinal: Positive for nausea, vomiting and anorexia.  Negative for abdominal pain.  Genitourinary: Negative.   Musculoskeletal: Negative for myalgias, joint swelling and arthralgias.  Skin: Negative.  Negative for rash and wound.  Neurological: Positive for headaches. Negative for dizziness, seizures, weakness, light-headedness and numbness.  Hematological: Negative.   Psychiatric/Behavioral: Negative.     Physical Exam  BP 127/99  Pulse 102  Temp(Src) 98.4 F (36.9 C) (Oral)  Resp 18  Ht 5\' 5"  (1.651 m)  Wt 152 lb (68.947 kg)  BMI 25.29 kg/m2  SpO2 96%  LMP 10/09/2010  Physical Exam  Vitals reviewed. Constitutional: She is oriented to person, place, and time. She appears well-developed and well-nourished.  Non-toxic appearance. She appears distressed.  HENT:  Head: Normocephalic and atraumatic.  Right Ear: Tympanic membrane normal.  Left Ear: Tympanic membrane normal.  Nose: No mucosal edema or rhinorrhea.  Mouth/Throat: Uvula is midline and oropharynx is clear and moist.  Eyes: Conjunctivae and lids are normal. Pupils are equal, round, and reactive to light. Right conjunctiva is not injected. Right eye exhibits normal extraocular motion and no nystagmus.  Neck: Normal range of motion. No spinous process tenderness present. No rigidity. No erythema present.  Cardiovascular: Normal rate, regular rhythm, normal heart sounds and intact distal pulses.   Pulmonary/Chest: Effort normal and breath sounds normal. She has no wheezes.  Abdominal: Soft. Bowel sounds are normal. There is no tenderness.  Musculoskeletal: Normal range of motion.  Neurological: She is alert and oriented to person, place, and time. She has normal strength. No cranial nerve deficit or sensory deficit. She displays  a negative Romberg sign. Coordination and gait normal. GCS eye subscore is 4. GCS verbal subscore is 5. GCS motor subscore is 6.  Skin: Skin is warm and dry.  Psychiatric: She has a normal mood and affect.    ED Course  Procedures  Pt given IV NS 1  liter bolus.  Given decadron 10 mg IV,  Benadryl 50 IV and zofran 4 IV with no relief of sx.  Next tried morphine 4 mg IV with pain reduced to 5/10.  Toradol 30 IV given with improvement.  MDM       Candis Musa, PA                               Medical screening examination/treatment/procedure(s) were performed by non-physician practitioner and as supervising physician I was immediately available for consultation/collaboration.  01/13/11 1651  Benny Lennert, MD 02/15/11 [phone]

## 2011-01-13 NOTE — ED Notes (Signed)
Pt cont to wait to be eval by edp

## 2011-01-13 NOTE — ED Notes (Signed)
Pt presents with migraine x 3 days. Pt also c/o n/v and light sensitivity. Pt states she is experiencing visual disturbances. Pt states she has tried OTC medications with no relief. No active vomiting in triage. Pt ambulated to triage with steady gate.

## 2011-01-13 NOTE — ED Notes (Signed)
Pt states pain med helped a little but her head still hurts

## 2011-01-18 ENCOUNTER — Encounter (HOSPITAL_COMMUNITY): Payer: Self-pay | Admitting: Emergency Medicine

## 2011-01-18 ENCOUNTER — Emergency Department (HOSPITAL_COMMUNITY)
Admission: EM | Admit: 2011-01-18 | Discharge: 2011-01-18 | Disposition: A | Discharge status: Home / Self Care | Payer: Self-pay | Attending: Emergency Medicine | Admitting: Emergency Medicine

## 2011-01-18 DIAGNOSIS — R51 Headache: Secondary | ICD-10-CM

## 2011-01-18 DIAGNOSIS — F17200 Nicotine dependence, unspecified, uncomplicated: Secondary | ICD-10-CM | POA: Insufficient documentation

## 2011-01-18 DIAGNOSIS — G43909 Migraine, unspecified, not intractable, without status migrainosus: Secondary | ICD-10-CM | POA: Insufficient documentation

## 2011-01-18 MED ORDER — METOCLOPRAMIDE HCL 5 MG/ML IJ SOLN
10.0000 mg | Freq: Once | INTRAMUSCULAR | Status: DC
  Filled 2011-01-18: qty 2

## 2011-01-18 MED ORDER — KETOROLAC TROMETHAMINE 60 MG/2ML IM SOLN
60.0000 mg | Freq: Once | INTRAMUSCULAR | Status: DC
  Filled 2011-01-18: qty 2

## 2011-01-18 NOTE — ED Provider Notes (Signed)
History   Written by Enos Fling acting as scribe for Lyanne Co, MD.   Chief Complaint  Patient presents with  . Migraine   HPI Comments: Pt c/o ha x 6 days, located in her forehead and sides of her head which is c/w usual migraines pt has had throughout her life. Pt was seen in ED a few days ago, given percocet which relieved ha but pt ran out of meds and ha returned. +n/v but none today. Also tried tylenol, excedrin, ibuprofen with no relief. Pt has f/u appt with neurologist next week.  Patient is a 40 y.o. female presenting with migraine. The history is provided by the patient.  Migraine This is a recurrent problem. The current episode started more than 2 days ago. The problem occurs constantly. The problem has not changed since onset.Associated symptoms include headaches. Pertinent negatives include no chest pain, no abdominal pain and no shortness of breath. Exacerbated by: light. The symptoms are relieved by narcotics (returned when stopped taking). She has tried acetaminophen for the symptoms.    Past Medical History  Diagnosis Date  . Migraine     Past Surgical History  Procedure Date  . Abdominal hysterectomy   . Knee surgery     History reviewed. No pertinent family history.  History  Substance Use Topics  . Smoking status: Current Everyday Smoker -- 0.5 packs/day    Types: Cigarettes  . Smokeless tobacco: Not on file  . Alcohol Use: No    OB History    Grav Para Term Preterm Abortions TAB SAB Ect Mult Living                  Review of Systems  Eyes: Positive for photophobia.  Respiratory: Negative for shortness of breath.   Cardiovascular: Negative for chest pain.  Gastrointestinal: Positive for nausea and vomiting. Negative for abdominal pain.  Neurological: Positive for headaches.  All other systems reviewed and are negative.    Physical Exam  BP 133/86  Pulse 95  Temp(Src) 98.6 F (37 C) (Oral)  Resp 19  SpO2 97%  LMP  10/09/2010  Physical Exam  Nursing note and vitals reviewed. Constitutional: She is oriented to person, place, and time. She appears well-developed and well-nourished. No distress.  HENT:  Head: Normocephalic.  Mouth/Throat: Mucous membranes are normal.  Eyes:       Normal appearance  Neck: Normal range of motion. Neck supple.  Cardiovascular: Normal rate and regular rhythm.  Exam reveals no gallop and no friction rub.   No murmur heard. Pulmonary/Chest: Effort normal and breath sounds normal. She has no wheezes. She has no rhonchi. She has no rales.  Abdominal: Soft. There is no tenderness.  Musculoskeletal: Normal range of motion.       Normal appearance  Neurological: She is alert and oriented to person, place, and time.       Motor intact in all extremities  Skin: Skin is warm and dry. No rash noted.       Color normal  Psychiatric: She has a normal mood and affect.    ED Course  Procedures  MDM  Typical migraine headache for the pt. Non focal neuro exam. No recent head trauma. No fever. Doubt meningitis. Doubt intracranial bleed. Doubt normal pressure hydrocephalus. No indication for imaging. Will treat with migraine cocktail and reevaluate I personally performed the services described in this documentation, which was scribed in my presence. The recorded information has been reviewed and considered. Lyanne Co,  MD   Pt left ER without informing ER physician. No ability to recheck pt. Left AMA    Lyanne Co, MD 01/19/11 2246

## 2011-01-18 NOTE — ED Notes (Signed)
Migraine since last Wednesday. States has ran out of the percocet. Nad. "headache is back"

## 2011-01-18 NOTE — ED Notes (Signed)
Pt still not in room. Checked bathrooms and lobby. Unable to locate pt. MD and charge nurse aware.

## 2011-01-18 NOTE — ED Notes (Signed)
Entered pt room to administer medicene. Pt not in room. Awaiting pt return.

## 2011-03-08 ENCOUNTER — Emergency Department (HOSPITAL_COMMUNITY)
Admission: EM | Admit: 2011-03-08 | Discharge: 2011-03-08 | Disposition: A | Discharge status: Home / Self Care | Payer: Self-pay | Attending: Emergency Medicine | Admitting: Emergency Medicine

## 2011-03-08 ENCOUNTER — Encounter (HOSPITAL_COMMUNITY): Payer: Self-pay | Admitting: *Deleted

## 2011-03-08 DIAGNOSIS — F17200 Nicotine dependence, unspecified, uncomplicated: Secondary | ICD-10-CM | POA: Insufficient documentation

## 2011-03-08 DIAGNOSIS — Z9079 Acquired absence of other genital organ(s): Secondary | ICD-10-CM | POA: Insufficient documentation

## 2011-03-08 DIAGNOSIS — G43909 Migraine, unspecified, not intractable, without status migrainosus: Secondary | ICD-10-CM | POA: Insufficient documentation

## 2011-03-08 MED ORDER — OXYCODONE-ACETAMINOPHEN 5-325 MG PO TABS: 2.0000 | ORAL_TABLET | Freq: Four times a day (QID) | ORAL | Status: AC | PRN

## 2011-03-08 MED ORDER — HYDROMORPHONE HCL 2 MG/ML IJ SOLN
2.0000 mg | Freq: Once | INTRAMUSCULAR | Status: AC
  Administered 2011-03-08: 2 mg via INTRAMUSCULAR
  Filled 2011-03-08: qty 1

## 2011-03-08 MED ORDER — ONDANSETRON HCL 4 MG PO TABS: 4.0000 mg | ORAL_TABLET | Freq: Four times a day (QID) | ORAL | Status: AC

## 2011-03-08 NOTE — ED Notes (Signed)
Pt given coke to drink 

## 2011-03-08 NOTE — ED Provider Notes (Addendum)
History     CSN: [id number] Arrival date & time: 03/08/2011  1:23 PM  Chief Complaint  Patient presents with  . Migraine   Patient is a 40 y.o. female presenting with migraine.  Migraine   patient presents with typical migraine headache. Symptoms started this morning. Pain is in the forehead with radiation to left side of face and neck. Patient tried a pain tablet at home followed by ibuprofen. It helped for about 3 hours the pain has returned. No stiff neck or neurological deficits. No fever  Past Medical History  Diagnosis Date  . Migraine     Past Surgical History  Procedure Date  . Abdominal hysterectomy   . Knee surgery     History reviewed. No pertinent family history.  History  Substance Use Topics  . Smoking status: Current Everyday Smoker -- 0.5 packs/day    Types: Cigarettes  . Smokeless tobacco: Not on file  . Alcohol Use: No    OB History    Grav Para Term Preterm Abortions TAB SAB Ect Mult Living                  Review of Systems  All other systems reviewed and are negative.    Physical Exam  BP 113/70  Pulse 93  Temp(Src) 97.9 F (36.6 C) (Oral)  Resp 17  Ht 5\' 5"  (1.651 m)  Wt 152 lb (68.947 kg)  BMI 25.29 kg/m2  SpO2 97%  LMP 10/09/2010  Physical Exam  Nursing note and vitals reviewed. Constitutional: She is oriented to person, place, and time. She appears well-developed and well-nourished.  HENT:  Head: Normocephalic and atraumatic.  Eyes: Conjunctivae and EOM are normal. Pupils are equal, round, and reactive to light.  Neck: Normal range of motion. Neck supple.  Cardiovascular: Normal rate and regular rhythm.   Pulmonary/Chest: Effort normal and breath sounds normal.  Abdominal: Soft. Bowel sounds are normal.  Musculoskeletal: Normal range of motion.  Neurological: She is alert and oriented to person, place, and time.  Skin: Skin is warm and dry.  Psychiatric: She has a normal mood and affect.    ED Course   Procedures  MDM Patient has typical migraine headache. Will Rx with intramuscular Dilaudid. DC home with pain medicine and nausea medicine      Donnetta Hutching, MD 03/08/11 1425  Donnetta Hutching, MD 03/08/11 1430

## 2011-03-08 NOTE — ED Notes (Signed)
Pt c/o migraine headache that started about 6hrs ago.

## 2011-03-09 ENCOUNTER — Encounter (HOSPITAL_COMMUNITY): Payer: Self-pay | Admitting: Emergency Medicine

## 2011-03-09 ENCOUNTER — Emergency Department (HOSPITAL_COMMUNITY)
Admission: EM | Admit: 2011-03-09 | Discharge: 2011-03-10 | Disposition: A | Discharge status: Other Acute Inpatient Hospital | Payer: Self-pay | Attending: Emergency Medicine | Admitting: Emergency Medicine

## 2011-03-09 DIAGNOSIS — R05 Cough: Secondary | ICD-10-CM | POA: Insufficient documentation

## 2011-03-09 DIAGNOSIS — R059 Cough, unspecified: Secondary | ICD-10-CM | POA: Insufficient documentation

## 2011-03-09 DIAGNOSIS — F3289 Other specified depressive episodes: Secondary | ICD-10-CM | POA: Insufficient documentation

## 2011-03-09 DIAGNOSIS — R45851 Suicidal ideations: Secondary | ICD-10-CM

## 2011-03-09 DIAGNOSIS — R062 Wheezing: Secondary | ICD-10-CM | POA: Insufficient documentation

## 2011-03-09 DIAGNOSIS — R443 Hallucinations, unspecified: Secondary | ICD-10-CM | POA: Insufficient documentation

## 2011-03-09 DIAGNOSIS — F17200 Nicotine dependence, unspecified, uncomplicated: Secondary | ICD-10-CM | POA: Insufficient documentation

## 2011-03-09 DIAGNOSIS — F329 Major depressive disorder, single episode, unspecified: Secondary | ICD-10-CM | POA: Insufficient documentation

## 2011-03-09 LAB — COMPREHENSIVE METABOLIC PANEL
ALT: 14 U/L (ref 0–35)
AST: 21 U/L (ref 0–37)
Albumin: 3.7 g/dL (ref 3.5–5.2)
CO2: 32 mEq/L (ref 19–32)
Calcium: 9.6 mg/dL (ref 8.4–10.5)
GFR calc non Af Amer: >60 mL/min (ref >60)
Sodium: 137 mEq/L (ref 135–145)
Total Protein: 6.7 g/dL (ref 6.0–8.3)

## 2011-03-09 LAB — RAPID URINE DRUG SCREEN, HOSP PERFORMED
Amphetamines: NOT DETECTED
Barbiturates: NOT DETECTED
Benzodiazepines: POSITIVE — AB
Cocaine: NOT DETECTED
Tetrahydrocannabinol: POSITIVE — AB

## 2011-03-09 LAB — URINE MICROSCOPIC-ADD ON

## 2011-03-09 LAB — URINALYSIS, ROUTINE W REFLEX MICROSCOPIC
Bilirubin Urine: NEGATIVE
Ketones, ur: NEGATIVE mg/dL
Nitrite: NEGATIVE
Protein, ur: NEGATIVE mg/dL
Urobilinogen, UA: 0.2 mg/dL (ref 0.0–1.0)

## 2011-03-09 LAB — CBC
MCH: 30.3 pg (ref 26.0–34.0)
Platelets: 274 10*3/uL (ref 150–400)
RBC: 5.01 MIL/uL (ref 3.87–5.11)
RDW: 13.2 % (ref 11.5–15.5)

## 2011-03-09 MED ORDER — ZOLPIDEM TARTRATE 5 MG PO TABS
ORAL_TABLET | ORAL | Status: AC
  Administered 2011-03-09: 5 mg via ORAL
  Filled 2011-03-09: qty 1

## 2011-03-09 MED ORDER — ZOLPIDEM TARTRATE 5 MG PO TABS
5.0000 mg | ORAL_TABLET | Freq: Once | ORAL | Status: AC
  Administered 2011-03-09: 5 mg via ORAL

## 2011-03-09 MED ORDER — ZOLPIDEM TARTRATE 5 MG PO TABS
ORAL_TABLET | ORAL | Status: AC
  Administered 2011-03-09: 5 mg
  Filled 2011-03-09: qty 1

## 2011-03-09 MED ORDER — LORAZEPAM 1 MG PO TABS
1.0000 mg | ORAL_TABLET | Freq: Once | ORAL | Status: AC
  Administered 2011-03-09 (×2): 1 mg via ORAL
  Filled 2011-03-09: qty 1

## 2011-03-09 MED ORDER — ACETAMINOPHEN 325 MG PO TABS
650.0000 mg | ORAL_TABLET | ORAL | Status: DC | PRN
  Administered 2011-03-09: 650 mg via ORAL
  Filled 2011-03-09: qty 2

## 2011-03-09 MED ORDER — LORAZEPAM 1 MG PO TABS
ORAL_TABLET | ORAL | Status: AC
  Administered 2011-03-09: 1 mg via ORAL
  Filled 2011-03-09: qty 1

## 2011-03-09 MED ORDER — NICOTINE 21 MG/24HR TD PT24
MEDICATED_PATCH | TRANSDERMAL | Status: AC
  Administered 2011-03-09: 21 mg
  Filled 2011-03-09: qty 1

## 2011-03-09 MED ORDER — ACETAMINOPHEN 500 MG PO TABS
1000.0000 mg | ORAL_TABLET | Freq: Once | ORAL | Status: AC
  Administered 2011-03-09: 1000 mg via ORAL
  Filled 2011-03-09: qty 2

## 2011-03-09 NOTE — ED Notes (Signed)
Pt c/o have suicidal thoughts and auditory hallucinations x one week. Pt state she does not have a plan to kill herself.

## 2011-03-09 NOTE — ED Notes (Signed)
No Change--requesting she be given phone to call her husband--phone given

## 2011-03-09 NOTE — ED Notes (Signed)
Resting on carrier--has been very quiet and cooperative this shift.  Tommy with [facility] was here to see pt and he advises she will be here throughout the night.

## 2011-03-09 NOTE — ED Notes (Signed)
Sleeping on carrier---opens eyes to verbal stimuli--lights down to increase comfort.

## 2011-03-09 NOTE — ED Notes (Signed)
Requesting something to eat--states she hasn't eaten in "a long time."  Tray ordered for pt.

## 2011-03-09 NOTE — ED Provider Notes (Signed)
History     CSN: [id number] Arrival date & time: 03/09/2011  9:55 AM   Chief Complaint  Patient presents with  . Medical Clearance  . Suicidal     (Include location/radiation/quality/duration/timing/severity/associated sxs/prior treatment) HPI Comments: Pt report  A week of auditory hallucinations. She report the voice tells her she is worthless, no good, and no one cares about her so she should kill herself. She determined she might take an overdose of pills, but has decided to get help. [place], but had an argument with this MD an has not return for care since May of 2012. No primary MD She is married for 6 years. Lives at home with husband.  Has 5 children and 2 grand children.  The problem is aggravated by stress and feeling bad about self. She presents for assistance with suicidal ideation and auditory hallucinations.  The history is provided by the patient.     Past Medical History  Diagnosis Date  . Migraine      Past Surgical History  Procedure Date  . Abdominal hysterectomy   . Knee surgery     History reviewed. No pertinent family history.  History  Substance Use Topics  . Smoking status: Current Everyday Smoker -- 0.5 packs/day    Types: Cigarettes  . Smokeless tobacco: Not on file  . Alcohol Use: No    OB History    Grav Para Term Preterm Abortions TAB SAB Ect Mult Living                  Review of Systems  Constitutional: Negative for activity change.       All ROS Neg except as noted in HPI  HENT: Negative for nosebleeds and neck pain.   Eyes: Negative for photophobia and discharge.  Respiratory: Positive for cough and wheezing. Negative for shortness of breath.   Cardiovascular: Negative for chest pain and palpitations.  Gastrointestinal: Negative for abdominal pain and blood in stool.  Genitourinary: Negative for dysuria, frequency and hematuria.  Musculoskeletal: Negative for back pain and  arthralgias.  Skin: Negative.   Neurological: Negative for dizziness, seizures and speech difficulty.  Psychiatric/Behavioral: Positive for suicidal ideas and hallucinations. Negative for confusion. The patient is nervous/anxious.     Allergies  Haldol; Penicillins; Darvocet; Haloperidol and related; and Imitrex  Home Medications   Current Outpatient Rx  Name Route Sig Dispense Refill  . ALPRAZOLAM 1 MG PO TABS Oral Take 1 mg by mouth at bedtime as needed.      Marland Kitchen GABAPENTIN 300 MG PO CAPS Oral Take 300 mg by mouth 3 (three) times daily.      . IBUPROFEN 200 MG PO TABS Oral Take 400 mg by mouth every 6 (six) hours as needed. Pain     . LORAZEPAM 1 MG PO TABS Oral Take 1 mg by mouth every 8 (eight) hours. For anxiety     . MULTIVITAMIN PO Oral Take 1 tablet by mouth daily.      Marland Kitchen NAPROXEN SODIUM 220 MG PO TABS Oral Take 220 mg by mouth 2 (two) times daily as needed. Pain     . ONDANSETRON HCL 4 MG PO TABS Oral Take 1 tablet (4 mg total) by mouth every 6 (six) hours. 12 tablet 0  . OXYCODONE-ACETAMINOPHEN 10-650 MG PO TABS Oral Take 1 tablet by mouth every 6 (six) hours as needed. Pain     . RISPERIDONE 1 MG PO TABS Oral  Take 1 mg by mouth at bedtime.      . SERTRALINE HCL 50 MG PO TABS Oral Take 50 mg by mouth daily.      . TRAZODONE HCL 50 MG PO TABS Oral Take 50 mg by mouth at bedtime.      . OXYCODONE-ACETAMINOPHEN 5-325 MG PO TABS Oral Take 2 tablets by mouth every 6 (six) hours as needed for pain. 20 tablet 0  . PROMETHAZINE HCL 50 MG PO TABS Oral Take 50 mg by mouth every 6 (six) hours as needed. Nausea       Physical Exam    BP 116/74  Pulse 99  Temp(Src) 98.4 F (36.9 C) (Oral)  Resp 20  Ht 5\' 3"  (1.6 m)  Wt 153 lb (69.4 kg)  BMI 27.10 kg/m2  SpO2 97%  LMP 10/09/2010  Physical Exam  Nursing note and vitals reviewed. Constitutional: She is oriented to person, place, and time. She appears well-developed and well-nourished.  Non-toxic appearance.  HENT:  Head:  Normocephalic.  Right Ear: Tympanic membrane and external ear normal.  Left Ear: Tympanic membrane and external ear normal.  Eyes: EOM and lids are normal. Pupils are equal, round, and reactive to light.  Neck: Normal range of motion. Neck supple. Carotid bruit is not present.  Cardiovascular: Normal rate, regular rhythm, normal heart sounds, intact distal pulses and normal pulses.   Pulmonary/Chest: No respiratory distress. She has wheezes. She has rhonchi.  Abdominal: Soft. Bowel sounds are normal. There is no tenderness. There is no guarding.  Musculoskeletal: Normal range of motion.  Lymphadenopathy:       Head (right side): No submandibular adenopathy present.       Head (left side): No submandibular adenopathy present.    She has no cervical adenopathy.  Neurological: She is alert and oriented to person, place, and time. She has normal strength. No cranial nerve deficit or sensory deficit.  Skin: Skin is warm and dry.  Psychiatric: Her speech is normal. Her affect is labile. She is withdrawn. Cognition and memory are normal. She expresses suicidal ideation.    ED Course: Pt eating a meal without problem. Remains withdrawn. Rarely makes eye contact. Conversation clear and understandable. Will obtain ACT team consult.  Procedures  Results for orders placed during the hospital encounter of 12/24/10  POCT PREGNANCY, URINE      Component Value Range   Preg Test, Ur NEGATIVE    DIFFERENTIAL      Component Value Range   Neutrophils Relative 63  43 - 77 (%)   Neutro Abs 4.9  1.7 - 7.7 (K/uL)   Lymphocytes Relative 30  12 - 46 (%)   Lymphs Abs 2.3  0.7 - 4.0 (K/uL)   Monocytes Relative 5  3 - 12 (%)   Monocytes Absolute 0.4  0.1 - 1.0 (K/uL)   Eosinophils Relative 1  0 - 5 (%)   Eosinophils Absolute 0.1  0.0 - 0.7 (K/uL)   Basophils Relative 0  0 - 1 (%)   Basophils Absolute 0.0  0.0 - 0.1 (K/uL)  CBC      Component Value Range   WBC 7.7  4.0 - 10.5 (K/uL)   RBC 4.86  3.87 - 5.11  (MIL/uL)   Hemoglobin 14.3  12.0 - 15.0 (g/dL)   HCT 21.3  08.6 - 57.8 (%)   MCV 84.6  78.0 - 100.0 (fL)   MCH 29.4  26.0 - 34.0 (pg)   MCHC 34.8  30.0 - 36.0 (g/dL)  RDW 13.8  11.5 - 15.5 (%)   Platelets 235  150 - 400 (K/uL)  COMPREHENSIVE METABOLIC PANEL      Component Value Range   Sodium 138  135 - 145 (mEq/L)   Potassium 3.6  3.5 - 5.1 (mEq/L)   Chloride 103  96 - 112 (mEq/L)   CO2 26  19 - 32 (mEq/L)   Glucose, Bld 104 (*) 70 - 99 (mg/dL)   BUN 7  6 - 23 (mg/dL)   Creatinine, Ser 8.29  0.50 - 1.10 (mg/dL)   Calcium 9.9  8.4 - 56.2 (mg/dL)   Total Protein 6.7  6.0 - 8.3 (g/dL)   Albumin 3.6  3.5 - 5.2 (g/dL)   AST 29  0 - 37 (U/L)   ALT 35  0 - 35 (U/L)   Alkaline Phosphatase 70  39 - 117 (U/L)   Total Bilirubin 0.3  0.3 - 1.2 (mg/dL)   GFR calc non Af Amer >60  >60 (mL/min)   GFR calc Af Amer >60  >60 (mL/min)      Dx: Suicidal Ideation 2. Depression  MDM I have reviewed nursing notes, vital signs, and all appropriate lab and imaging results for this patient.  Results for orders placed during the hospital encounter of 03/09/11  CBC      Component Value Range   WBC 10.0  4.0 - 10.5 (K/uL)   RBC 5.01  3.87 - 5.11 (MIL/uL)   Hemoglobin 15.2 (*) 12.0 - 15.0 (g/dL)   HCT 13.0  86.5 - 78.4 (%)   MCV 88.0  78.0 - 100.0 (fL)   MCH 30.3  26.0 - 34.0 (pg)   MCHC 34.5  30.0 - 36.0 (g/dL)   RDW 69.6  29.5 - 28.4 (%)   Platelets 274  150 - 400 (K/uL)  COMPREHENSIVE METABOLIC PANEL      Component Value Range   Sodium 137  135 - 145 (mEq/L)   Potassium 3.8  3.5 - 5.1 (mEq/L)   Chloride 99  96 - 112 (mEq/L)   CO2 32  19 - 32 (mEq/L)   Glucose, Bld 124 (*) 70 - 99 (mg/dL)   BUN 11  6 - 23 (mg/dL)   Creatinine, Ser 1.32  0.50 - 1.10 (mg/dL)   Calcium 9.6  8.4 - 44.0 (mg/dL)   Total Protein 6.7  6.0 - 8.3 (g/dL)   Albumin 3.7  3.5 - 5.2 (g/dL)   AST 21  0 - 37 (U/L)   ALT 14  0 - 35 (U/L)   Alkaline Phosphatase 77  39 - 117 (U/L)   Total Bilirubin 0.2 (*) 0.3 - 1.2  (mg/dL)   GFR calc non Af Amer >60  >60 (mL/min)   GFR calc Af Amer >60  >60 (mL/min)  ETHANOL      Component Value Range   Alcohol, Ethyl (B) <11  0 - 11 (mg/dL)  URINE RAPID DRUG SCREEN (HOSP PERFORMED)      Component Value Range   Opiates POSITIVE (*) NONE DETECTED    Cocaine NONE DETECTED  NONE DETECTED    Benzodiazepines POSITIVE (*) NONE DETECTED    Amphetamines NONE DETECTED  NONE DETECTED    Tetrahydrocannabinol POSITIVE (*) NONE DETECTED    Barbiturates NONE DETECTED  NONE DETECTED   URINALYSIS, ROUTINE W REFLEX MICROSCOPIC      Component Value Range   Color, Urine YELLOW  YELLOW    Appearance CLEAR  CLEAR    Specific Gravity, Urine 1.025  1.005 -  1.030    pH 5.5  5.0 - 8.0    Glucose, UA NEGATIVE  NEGATIVE (mg/dL)   Hgb urine dipstick SMALL (*) NEGATIVE    Bilirubin Urine NEGATIVE  NEGATIVE    Ketones, ur NEGATIVE  NEGATIVE (mg/dL)   Protein, ur NEGATIVE  NEGATIVE (mg/dL)   Urobilinogen, UA 0.2  0.0 - 1.0 (mg/dL)   Nitrite NEGATIVE  NEGATIVE    Leukocytes, UA NEGATIVE  NEGATIVE   URINE MICROSCOPIC-ADD ON      Component Value Range   Squamous Epithelial / LPF FEW (*) RARE    RBC / HPF 3-6  <3 (RBC/hpf)   No results found.      Kathie Dike, [place] 03/11/11 [phone]

## 2011-03-09 NOTE — ED Notes (Signed)
No Change in status--continues to report relief in headache--Sitter remains with patient.

## 2011-03-09 NOTE — ED Notes (Signed)
Pt given coke per request. 

## 2011-03-09 NOTE — ED Notes (Signed)
Sleeping off and on carrier--awakens easily and reports relief of headache pain.

## 2011-03-09 NOTE — ED Notes (Signed)
Awake, watching TV and drinking soda--Sitter remains with pt.

## 2011-03-09 NOTE — Progress Notes (Signed)
ACT [name] eval pt in ED.  Needs admit but no beds available tonight.  Holding orders already written.  No acute issues currently.

## 2011-03-09 NOTE — ED Notes (Signed)
[name] is coming to see Pt

## 2011-03-09 NOTE — ED Notes (Signed)
States she is here for "suicidal thoughts and hearing voices"  Onset few days ago.  Voices are telling her she is "hopeless" and to kill herself by taking pills.  Very flat affect---reports extensive history for depression and has been in trt facilities numerous times which helps only for a short time.  Last time she was in a trt facility was March 2012.

## 2011-03-09 NOTE — ED Notes (Signed)
C/o headache---lights down to increase comfort--sitter at door.

## 2011-03-10 MED ORDER — ALUM & MAG HYDROXIDE-SIMETH 200-200-20 MG/5ML PO SUSP: 30.0000 mL | ORAL | Status: DC | PRN

## 2011-03-10 MED ORDER — OXYCODONE-ACETAMINOPHEN 5-325 MG PO TABS
2.0000 | ORAL_TABLET | Freq: Three times a day (TID) | ORAL | Status: DC | PRN
  Administered 2011-03-10 (×2): 2 via ORAL
  Filled 2011-03-10 (×2): qty 2

## 2011-03-10 MED ORDER — ONDANSETRON HCL 4 MG PO TABS: 4.0000 mg | ORAL_TABLET | ORAL | Status: DC | PRN

## 2011-03-10 MED ORDER — ZOLPIDEM TARTRATE 5 MG PO TABS: 10.0000 mg | ORAL_TABLET | Freq: Every evening | ORAL | Status: DC | PRN

## 2011-03-10 MED ORDER — LORAZEPAM 1 MG PO TABS
1.0000 mg | ORAL_TABLET | Freq: Three times a day (TID) | ORAL | Status: DC | PRN
  Administered 2011-03-10 (×2): 1 mg via ORAL
  Filled 2011-03-10 (×2): qty 1

## 2011-03-10 MED ORDER — NICOTINE 21 MG/24HR TD PT24
21.0000 mg | MEDICATED_PATCH | Freq: Every day | TRANSDERMAL | Status: DC
  Administered 2011-03-10: 21 mg via TRANSDERMAL
  Filled 2011-03-10: qty 1

## 2011-03-10 NOTE — ED Notes (Signed)
Sitter change after lunch break

## 2011-03-10 NOTE — ED Notes (Signed)
Breakfast tray given to pt 

## 2011-03-10 NOTE — ED Notes (Signed)
Requesting another Nicotine patch as her came off in the shower---Patch applied to right upper arm.

## 2011-03-10 NOTE — ED Notes (Signed)
Pt took a shower with sitter present, paper scrubs and linen change

## 2011-03-10 NOTE — ED Notes (Signed)
Wants to know if she can have something else for her headache pain--advised no meds can be given until after 1 p.m.

## 2011-03-10 NOTE — ED Notes (Signed)
Husband left room and when he came back, handed two cigarettes and a lighter to pt. I took both from pt and informed about hospital's non-smoking policy.

## 2011-03-10 NOTE — ED Notes (Signed)
Lunch tray given to pt.

## 2011-03-10 NOTE — ED Notes (Signed)
Requesting RX for headache---Percocet 10 mg given per order--Sitter with patient

## 2011-03-10 NOTE — ED Notes (Signed)
Still c/o headache pain--lights down, TV off to encourage rest.

## 2011-03-10 NOTE — ED Notes (Signed)
Christiane Ha with [facility] called---advises pt. Has been accepted and as soon as bed is available-he will notify us to send her---thinks bed will become available today.

## 2011-03-10 NOTE — ED Notes (Signed)
Assisted pt to bathroom, pt c/o severe headache, states :"The migraine makes me dizzy"

## 2011-03-10 NOTE — ED Notes (Signed)
Patient called her husband before he went to work no answer, so patient feeling sad. Also patient was is drinking  Coffee at this time.

## 2011-03-10 NOTE — ED Notes (Signed)
Patient sitting on stretcher calling her family members, no one is answering her calls. States that she wants to see EDP, that tyenol was not helping her.

## 2011-03-10 NOTE — ED Notes (Signed)
Sitting on side of bed writing on piece of paper---States "I haven't been able to sleep all night."  Smiling this a.m. And reports feeling a little bit better--She feels the Ativan has improved her mood.

## 2011-03-10 NOTE — ED Notes (Signed)
Warm blanket offered , pt refused; Provided pt with wet washcloth for forehead; Pt sleeping now

## 2011-03-10 NOTE — ED Notes (Signed)
Patient toileted

## 2011-03-10 NOTE — ED Notes (Signed)
[name] with [facility] called--bed is now available and Dr. Lamonte Sakai is the accepting MD--Ella, ACT Team Coordinator is enroute to complete necessary documentation prior to transfer

## 2011-03-10 NOTE — ED Notes (Signed)
Per [facility]---bed is now available--Report has been called to [facility], RN and [facility] has been called to transport

## 2011-03-11 NOTE — ED Provider Notes (Signed)
History/physical exam/procedure(s) were performed by non-physician practitioner and as supervising physician I was immediately available for consultation/collaboration. I have reviewed all notes and am in agreement with care and plan.   Hilario Quarry, MD 03/11/11 1000

## 2011-03-17 LAB — URINALYSIS, ROUTINE W REFLEX MICROSCOPIC
Bilirubin Urine: NEGATIVE
Protein, ur: NEGATIVE
Urobilinogen, UA: 0.2

## 2011-03-17 LAB — LIPASE, BLOOD: Lipase: 22

## 2011-03-17 LAB — COMPREHENSIVE METABOLIC PANEL
CO2: 28
Calcium: 8.5
Creatinine, Ser: 0.74
GFR calc non Af Amer: >60
Glucose, Bld: 98

## 2011-03-17 LAB — CBC
Hemoglobin: 13.4
MCHC: 34.7
MCV: 91.9
RBC: 4.19

## 2011-03-17 LAB — DIFFERENTIAL
Eosinophils Absolute: 0.1
Lymphocytes Relative: 31
Lymphs Abs: 4.2 — ABNORMAL HIGH
Neutrophils Relative %: 61

## 2011-03-22 ENCOUNTER — Emergency Department (HOSPITAL_COMMUNITY)
Admission: EM | Admit: 2011-03-22 | Discharge: 2011-03-22 | Disposition: A | Discharge status: Other Acute Inpatient Hospital | Payer: Self-pay | Attending: Emergency Medicine | Admitting: Emergency Medicine

## 2011-03-22 ENCOUNTER — Encounter (HOSPITAL_COMMUNITY): Payer: Self-pay | Admitting: Emergency Medicine

## 2011-03-22 DIAGNOSIS — F17200 Nicotine dependence, unspecified, uncomplicated: Secondary | ICD-10-CM | POA: Insufficient documentation

## 2011-03-22 DIAGNOSIS — R45851 Suicidal ideations: Secondary | ICD-10-CM | POA: Insufficient documentation

## 2011-03-22 DIAGNOSIS — R443 Hallucinations, unspecified: Secondary | ICD-10-CM | POA: Insufficient documentation

## 2011-03-22 LAB — DIFFERENTIAL
Basophils Absolute: 0
Basophils Relative: 1
Basophils Relative: 1
Eosinophils Absolute: 0
Eosinophils Absolute: 0.1
Eosinophils Relative: 1
Lymphocytes Relative: 42
Lymphs Abs: 2.5
Monocytes Absolute: 0.3
Monocytes Absolute: 0.4
Monocytes Relative: 5
Neutrophils Relative %: 59

## 2011-03-22 LAB — URINALYSIS, ROUTINE W REFLEX MICROSCOPIC
Ketones, ur: NEGATIVE
Leukocytes, UA: NEGATIVE
Nitrite: NEGATIVE
Specific Gravity, Urine: >1.03 — ABNORMAL HIGH
Urobilinogen, UA: 0.2
pH: 5.5

## 2011-03-22 LAB — CBC
HCT: 40.1 % (ref 36.0–46.0)
HCT: 38.5
Hemoglobin: 13.6
Hemoglobin: 14.6
MCH: 29.6 pg (ref 26.0–34.0)
MCHC: 34.9
MCHC: 35.3
MCV: 87.9 fL (ref 78.0–100.0)
MCV: 94.6
Platelets: 217
Platelets: 231
RBC: 4.56 MIL/uL (ref 3.87–5.11)
RDW: 13.1 % (ref 11.5–15.5)
RDW: 14
RDW: 14.1
WBC: 5.8 10*3/uL (ref 4.0–10.5)

## 2011-03-22 LAB — COMPREHENSIVE METABOLIC PANEL
ALT: 10
Albumin: 3.9
Alkaline Phosphatase: 90
BUN: 9 mg/dL (ref 6–23)
CO2: 28 mEq/L (ref 19–32)
Calcium: 9 mg/dL (ref 8.4–10.5)
Calcium: 9.2
Chloride: 99 mEq/L (ref 96–112)
Creatinine, Ser: 0.58 mg/dL (ref 0.50–1.10)
GFR calc Af Amer: >60 mL/min (ref >60)
GFR calc Af Amer: >60
GFR calc non Af Amer: >60 mL/min (ref >60)
Glucose, Bld: 86
Potassium: 4.4
Sodium: 138
Total Bilirubin: 0.2 mg/dL — ABNORMAL LOW (ref 0.3–1.2)
Total Protein: 6.8

## 2011-03-22 LAB — BASIC METABOLIC PANEL
BUN: 6
CO2: 31
GFR calc non Af Amer: >60
Glucose, Bld: 98
Potassium: 4.4
Sodium: 140

## 2011-03-22 LAB — HEPATIC FUNCTION PANEL
AST: 17
Alkaline Phosphatase: 69
Bilirubin, Direct: 0.1
Total Bilirubin: 0.5

## 2011-03-22 LAB — URINE MICROSCOPIC-ADD ON

## 2011-03-22 LAB — ACETAMINOPHEN LEVEL: Acetaminophen (Tylenol), Serum: <15 ug/mL (ref 10–30)

## 2011-03-22 LAB — POCT PREGNANCY, URINE: Preg Test, Ur: NEGATIVE

## 2011-03-22 LAB — RAPID URINE DRUG SCREEN, HOSP PERFORMED: Opiates: NOT DETECTED

## 2011-03-22 LAB — ETHANOL: Alcohol, Ethyl (B): <11 mg/dL (ref 0–11)

## 2011-03-22 LAB — PREGNANCY, URINE
Preg Test, Ur: NEGATIVE
Preg Test, Ur: NEGATIVE

## 2011-03-22 MED ORDER — ZOLPIDEM TARTRATE 5 MG PO TABS: 5.0000 mg | ORAL_TABLET | Freq: Every evening | ORAL | Status: DC | PRN

## 2011-03-22 MED ORDER — IBUPROFEN 400 MG PO TABS: 600.0000 mg | ORAL_TABLET | Freq: Three times a day (TID) | ORAL | Status: DC | PRN

## 2011-03-22 MED ORDER — LORAZEPAM 1 MG PO TABS: 1.0000 mg | ORAL_TABLET | Freq: Three times a day (TID) | ORAL | Status: DC | PRN

## 2011-03-22 MED ORDER — ONDANSETRON HCL 4 MG PO TABS: 4.0000 mg | ORAL_TABLET | Freq: Three times a day (TID) | ORAL | Status: DC | PRN

## 2011-03-22 NOTE — ED Provider Notes (Signed)
History   Chart scribed for Norma Chick, MD by Enos Fling; the patient was seen in room APA17/APA17; this patient's care was started at 12:51 PM.    CSN: [id number] Arrival date & time: 03/22/2011 11:25 AM  Chief Complaint  Patient presents with  . Suicidal   HPI [name] is a 40 y.o. female who presents to the Emergency Department complaining of auditory hallucinations and SI. Pt states she has been hearing voices telling her to kill herself because she is worthless. Pt states auditory hallucinations are not new (onset 6 years ago) but controlled by risperdal until recently. She reports her risperdal dose was increased by physician at [facility] last week but that is not helping. Pt denies any HI or recent self injury. She admits to marijuana use which helps her deal with the hallucinations. Denies other drug or alcohol use. Pt also c/o cough but denies abd pain, n/v/d, chest pain, sob, or headache.  Past Medical History  Diagnosis Date  . Migraine     Past Surgical History  Procedure Date  . Abdominal hysterectomy   . Knee surgery     No family history on file.  History  Substance Use Topics  . Smoking status: Current Everyday Smoker -- 0.5 packs/day    Types: Cigarettes  . Smokeless tobacco: Not on file  . Alcohol Use: No    OB History    Grav Para Term Preterm Abortions TAB SAB Ect Mult Living                  Review of Systems 10 Systems reviewed and are negative for acute change except as noted in the HPI.   Allergies  Haldol; Penicillins; Darvocet; Haloperidol and related; and Imitrex  Home Medications   Current Outpatient Rx  Name Route Sig Dispense Refill  . ALPRAZOLAM 1 MG PO TABS Oral Take 1 mg by mouth at bedtime as needed.      Marland Kitchen GABAPENTIN 300 MG PO CAPS Oral Take 300 mg by mouth 3 (three) times daily.      . IBUPROFEN 200 MG PO TABS Oral Take 400 mg by mouth every 6 (six) hours as needed. Pain     . LORAZEPAM 1 MG PO TABS Oral Take 1  mg by mouth every 8 (eight) hours. For anxiety     . MULTIVITAMIN PO Oral Take 1 tablet by mouth daily.      Marland Kitchen NAPROXEN SODIUM 220 MG PO TABS Oral Take 220 mg by mouth 2 (two) times daily as needed. Pain     . OXYCODONE-ACETAMINOPHEN 10-650 MG PO TABS Oral Take 1 tablet by mouth every 6 (six) hours as needed. Pain     . PROMETHAZINE HCL 50 MG PO TABS Oral Take 50 mg by mouth every 6 (six) hours as needed. Nausea     . RISPERIDONE 1 MG PO TABS Oral Take 1 mg by mouth at bedtime.      . SERTRALINE HCL 50 MG PO TABS Oral Take 50 mg by mouth daily.      . TRAZODONE HCL 50 MG PO TABS Oral Take 50 mg by mouth at bedtime.        BP 122/75  Pulse 88  Temp(Src) 97.7 F (36.5 C) (Oral)  Resp 18  Ht 5\' 5"  (1.651 m)  Wt 152 lb (68.947 kg)  BMI 25.29 kg/m2  SpO2 97%  LMP 03/22/2011  Physical Exam  Nursing note and vitals reviewed. Constitutional: She is oriented  to person, place, and time. She appears well-developed and well-nourished. No distress.  HENT:  Head: Normocephalic.  Mouth/Throat: Mucous membranes are normal.  Eyes: Conjunctivae are normal.  Neck: Normal range of motion. Neck supple.  Cardiovascular: Normal rate, regular rhythm and intact distal pulses.  Exam reveals no gallop and no friction rub.   No murmur heard. Pulmonary/Chest: Effort normal and breath sounds normal. She has no wheezes. She has no rales.  Abdominal: Soft. There is no tenderness.  Musculoskeletal: Normal range of motion.  Neurological: She is alert and oriented to person, place, and time.  Skin: Skin is warm and dry. No rash noted.  Psychiatric: She has a normal mood and affect. She expresses suicidal ideation.       Calm, speaking normally and appropriately answering questions    ED Course  Procedures (including critical care time)  Labs Reviewed  COMPREHENSIVE METABOLIC PANEL - Abnormal; Notable for the following:    Glucose, Bld 115 (*)    Albumin 3.4 (*)    Total Bilirubin 0.2 (*)    All other  components within normal limits  URINE RAPID DRUG SCREEN (HOSP PERFORMED) - Abnormal; Notable for the following:    Tetrahydrocannabinol POSITIVE (*)    All other components within normal limits  CBC  ETHANOL  ACETAMINOPHEN LEVEL  PREGNANCY, URINE  POCT PREGNANCY, URINE     MDM  Pt with auditory hallucinations telling her to commit suicide.  Pt states she has not attempted anything.  Increased her respiridol 1 week ago which has not helped.   2:12 PM ACT team has been called and is here in the ED to asses this patient.    IMPRESSION: No diagnosis found. Suicidal  Auditory hallucinations  SCRIBE ATTESTATION: I personally performed the services described in this documentation, which was scribed in my presence. The recorded information has been reviewed and considered. Norma Chick, MD     Norma Chick, MD 03/22/11 [phone]

## 2011-03-22 NOTE — ED Notes (Signed)
Pt c/o si since last night with no plan. Pt also having auditory hallucinations-"voices that tell me i'm worthless and hopeless and might as well just end it all".

## 2011-03-22 NOTE — ED Notes (Signed)
Report given to Shawn,RN with [facility] 

## 2011-03-22 NOTE — ED Notes (Signed)
3 pairs of socks, 3 pairs of shorts, 3 pairs of pants, 4 pairs of underwear, 9 shirts, 1 box of cigarette filter tubes 200, 1 bag of original blend pipe tobacco,1 quickfill holder, 1 bottle of Vo5 2 in 1 shampoo and conditioner, 1 bottle of white rain body wash, 1 pair of flipflops,1 bottle of Neutrigena luquid make up, 1 eye liner, 1 lip liner, 1 eye make up, and 2 eye hancer, 1 pack of [place] 100's, 1 razor, 5 hair bows, 1 hair brush, 1 tooth brush, 1 stick of secret, 1 johnson's baby wash,

## 2011-03-23 LAB — URINALYSIS, ROUTINE W REFLEX MICROSCOPIC
Bilirubin Urine: NEGATIVE
Glucose, UA: NEGATIVE
Ketones, ur: NEGATIVE
Leukocytes, UA: NEGATIVE
Specific Gravity, Urine: 1.015
Specific Gravity, Urine: >1.03 — ABNORMAL HIGH
pH: 6
pH: 5

## 2011-03-23 LAB — DIFFERENTIAL
Eosinophils Absolute: 0.1
Eosinophils Relative: 1
Lymphocytes Relative: 28
Lymphs Abs: 3
Monocytes Absolute: 0.6

## 2011-03-23 LAB — COMPREHENSIVE METABOLIC PANEL
ALT: 10
AST: 14
Albumin: 3.2 — ABNORMAL LOW
CO2: 29
Calcium: 8.7
Chloride: 104
Creatinine, Ser: 0.55
GFR calc Af Amer: >60
GFR calc non Af Amer: >60
Sodium: 138
Total Bilirubin: 0.3

## 2011-03-23 LAB — LIPASE, BLOOD: Lipase: 16

## 2011-03-23 LAB — CBC
Platelets: 204
RBC: 3.97
WBC: 10.6 — ABNORMAL HIGH

## 2011-03-23 LAB — URINE MICROSCOPIC-ADD ON

## 2011-03-27 LAB — GLUCOSE, CAPILLARY
Glucose-Capillary: 156 — ABNORMAL HIGH
Glucose-Capillary: 172 — ABNORMAL HIGH
Glucose-Capillary: 181 — ABNORMAL HIGH
Glucose-Capillary: 222 — ABNORMAL HIGH

## 2011-03-27 LAB — CULTURE, RESPIRATORY W GRAM STAIN

## 2011-03-27 LAB — BASIC METABOLIC PANEL
BUN: 10
BUN: 5 — ABNORMAL LOW
BUN: 7
CO2: 27
CO2: 34 — ABNORMAL HIGH
Calcium: 8.3 — ABNORMAL LOW
Calcium: 7.8 — ABNORMAL LOW
Calcium: 8.4
Chloride: 105
Chloride: 94 — ABNORMAL LOW
Creatinine, Ser: 0.58
Creatinine, Ser: 0.68
Creatinine, Ser: 0.58
GFR calc Af Amer: >60
GFR calc Af Amer: >60
GFR calc Af Amer: >60
GFR calc Af Amer: >60
GFR calc non Af Amer: >60
GFR calc non Af Amer: >60
GFR calc non Af Amer: >60
Glucose, Bld: 251 — ABNORMAL HIGH
Potassium: 3.8
Potassium: 3.6
Sodium: 142
Sodium: 143
Sodium: 141

## 2011-03-27 LAB — CBC
HCT: 31.5 — ABNORMAL LOW
HCT: 32.3 — ABNORMAL LOW
HCT: 33.4 — ABNORMAL LOW
HCT: 34.3 — ABNORMAL LOW
HCT: 37.7
Hemoglobin: 10.7 — ABNORMAL LOW
Hemoglobin: 11 — ABNORMAL LOW
Hemoglobin: 11.1 — ABNORMAL LOW
Hemoglobin: 11.3 — ABNORMAL LOW
Hemoglobin: 12.9
MCHC: 33.8
MCHC: 33.9
MCHC: 34
MCHC: 34.2
MCHC: 34.2
MCV: 94.1
MCV: 94.2
MCV: 94.5
MCV: 94.6
MCV: 94.7
Platelets: 215
Platelets: 253
Platelets: 319
Platelets: 336
Platelets: 330
Platelets: 402 — ABNORMAL HIGH
RBC: 3.35 — ABNORMAL LOW
RBC: 3.42 — ABNORMAL LOW
RBC: 3.43 — ABNORMAL LOW
RBC: 3.53 — ABNORMAL LOW
RBC: 4.09
RBC: 3.58 — ABNORMAL LOW
RDW: 12.8
RDW: 13.3
RDW: 13.4
RDW: 13.4
RDW: 13.1
WBC: 11.6 — ABNORMAL HIGH
WBC: 15.7 — ABNORMAL HIGH
WBC: 15.9 — ABNORMAL HIGH
WBC: 17.2 — ABNORMAL HIGH
WBC: 9.5

## 2011-03-27 LAB — DIFFERENTIAL
Basophils Absolute: 0
Basophils Absolute: 0
Basophils Absolute: 0
Basophils Relative: 0
Basophils Relative: 0
Basophils Relative: 0
Basophils Relative: 0
Eosinophils Absolute: 0
Eosinophils Absolute: 0
Eosinophils Absolute: 0
Eosinophils Absolute: 0
Eosinophils Absolute: 0
Eosinophils Absolute: 0
Eosinophils Relative: 0
Eosinophils Relative: 0
Eosinophils Relative: 0
Eosinophils Relative: 0
Eosinophils Relative: 0
Lymphocytes Relative: 3 — ABNORMAL LOW
Lymphocytes Relative: 3 — ABNORMAL LOW
Lymphocytes Relative: 3 — ABNORMAL LOW
Lymphocytes Relative: 4 — ABNORMAL LOW
Lymphocytes Relative: 5 — ABNORMAL LOW
Lymphocytes Relative: 14
Lymphocytes Relative: 5 — ABNORMAL LOW
Lymphs Abs: 0.5 — ABNORMAL LOW
Lymphs Abs: 0.5 — ABNORMAL LOW
Lymphs Abs: 0.6 — ABNORMAL LOW
Lymphs Abs: 0.6 — ABNORMAL LOW
Lymphs Abs: 0.7
Lymphs Abs: 0.8
Lymphs Abs: 1.3
Monocytes Absolute: 0.3
Monocytes Absolute: 0.4
Monocytes Absolute: 0.8
Monocytes Relative: 2 — ABNORMAL LOW
Monocytes Relative: 2 — ABNORMAL LOW
Monocytes Relative: 3
Monocytes Relative: 4
Monocytes Relative: 6
Neutro Abs: 10.7 — ABNORMAL HIGH
Neutro Abs: 14.9 — ABNORMAL HIGH
Neutro Abs: 16.8 — ABNORMAL HIGH
Neutro Abs: 15.6 — ABNORMAL HIGH
Neutro Abs: 7.7
Neutrophils Relative %: 92 — ABNORMAL HIGH
Neutrophils Relative %: 93 — ABNORMAL HIGH
Neutrophils Relative %: 93 — ABNORMAL HIGH
Neutrophils Relative %: 95 — ABNORMAL HIGH
Neutrophils Relative %: 80 — ABNORMAL HIGH
Neutrophils Relative %: 91 — ABNORMAL HIGH
WBC Morphology: INCREASED

## 2011-03-27 LAB — BASIC METABOLIC PANEL WITH GFR
BUN: 11
CO2: 35 — ABNORMAL HIGH
Calcium: 8.3 — ABNORMAL LOW
Chloride: 98
Creatinine, Ser: 0.55
GFR calc non Af Amer: >60
Glucose, Bld: 176 — ABNORMAL HIGH
Potassium: 3.8
Sodium: 140

## 2011-03-27 LAB — BLOOD GAS, ARTERIAL
Acid-Base Excess: 10 — ABNORMAL HIGH
Acid-Base Excess: 4.7 — ABNORMAL HIGH
Bicarbonate: 34.7 — ABNORMAL HIGH
FIO2: 0.5
FIO2: 50
O2 Saturation: 92.9
O2 Saturation: 95.1
TCO2: 31
pCO2 arterial: 47.7 — ABNORMAL HIGH
pH, Arterial: 7.475 — ABNORMAL HIGH
pO2, Arterial: 64.5 — ABNORMAL LOW

## 2011-03-27 LAB — CULTURE, BLOOD (ROUTINE X 2)
Culture: NO GROWTH
Culture: NO GROWTH
Report Status: 9302009

## 2011-03-27 LAB — B-NATRIURETIC PEPTIDE (CONVERTED LAB): Pro B Natriuretic peptide (BNP): 331 — ABNORMAL HIGH

## 2011-03-27 LAB — COMPREHENSIVE METABOLIC PANEL
AST: 31
CO2: 28
CO2: 28
Calcium: 8.3 — ABNORMAL LOW
Calcium: 9
Creatinine, Ser: 0.59
Creatinine, Ser: 0.61
GFR calc Af Amer: >60
GFR calc non Af Amer: >60
GFR calc non Af Amer: >60
Glucose, Bld: 172 — ABNORMAL HIGH
Sodium: 136
Total Protein: 5.7 — ABNORMAL LOW

## 2011-03-27 LAB — TSH: TSH: 0.084 — ABNORMAL LOW

## 2011-03-27 LAB — VALPROIC ACID LEVEL: Valproic Acid Lvl: 35 — ABNORMAL LOW

## 2011-03-30 ENCOUNTER — Emergency Department (HOSPITAL_COMMUNITY)
Admission: EM | Admit: 2011-03-30 | Discharge: 2011-03-31 | Disposition: A | Discharge status: Home / Self Care | Payer: Self-pay | Attending: Emergency Medicine | Admitting: Emergency Medicine

## 2011-03-30 ENCOUNTER — Encounter (HOSPITAL_COMMUNITY): Payer: Self-pay | Admitting: Emergency Medicine

## 2011-03-30 DIAGNOSIS — Z79899 Other long term (current) drug therapy: Secondary | ICD-10-CM | POA: Insufficient documentation

## 2011-03-30 DIAGNOSIS — R45851 Suicidal ideations: Secondary | ICD-10-CM | POA: Insufficient documentation

## 2011-03-30 DIAGNOSIS — F329 Major depressive disorder, single episode, unspecified: Secondary | ICD-10-CM | POA: Insufficient documentation

## 2011-03-30 DIAGNOSIS — M545 Low back pain, unspecified: Secondary | ICD-10-CM | POA: Insufficient documentation

## 2011-03-30 DIAGNOSIS — G8929 Other chronic pain: Secondary | ICD-10-CM | POA: Insufficient documentation

## 2011-03-30 DIAGNOSIS — F17200 Nicotine dependence, unspecified, uncomplicated: Secondary | ICD-10-CM | POA: Insufficient documentation

## 2011-03-30 DIAGNOSIS — F3289 Other specified depressive episodes: Secondary | ICD-10-CM | POA: Insufficient documentation

## 2011-03-30 LAB — CBC
Hemoglobin: 15.1 g/dL — ABNORMAL HIGH (ref 12.0–15.0)
MCH: 30.2 pg (ref 26.0–34.0)
Platelets: 320 10*3/uL (ref 150–400)
RBC: 5 MIL/uL (ref 3.87–5.11)
WBC: 14.1 10*3/uL — ABNORMAL HIGH (ref 4.0–10.5)

## 2011-03-30 LAB — BASIC METABOLIC PANEL
CO2: 30 mEq/L (ref 19–32)
Calcium: 9.5 mg/dL (ref 8.4–10.5)
Potassium: 4.4 mEq/L (ref 3.5–5.1)
Sodium: 138 mEq/L (ref 135–145)

## 2011-03-30 LAB — ETHANOL: Alcohol, Ethyl (B): <11 mg/dL (ref 0–11)

## 2011-03-30 LAB — PREGNANCY, URINE: Preg Test, Ur: NEGATIVE

## 2011-03-30 LAB — RAPID URINE DRUG SCREEN, HOSP PERFORMED
Benzodiazepines: POSITIVE — AB
Opiates: NOT DETECTED

## 2011-03-30 MED ORDER — OXYCODONE-ACETAMINOPHEN 5-325 MG PO TABS
2.0000 | ORAL_TABLET | Freq: Once | ORAL | Status: AC
  Administered 2011-03-30: 2 via ORAL
  Filled 2011-03-30: qty 2

## 2011-03-30 MED ORDER — NICOTINE 14 MG/24HR TD PT24
14.0000 mg | MEDICATED_PATCH | Freq: Once | TRANSDERMAL | Status: DC
  Administered 2011-03-30: 14 mg via TRANSDERMAL
  Filled 2011-03-30: qty 1

## 2011-03-30 MED ORDER — OXYCODONE-ACETAMINOPHEN 5-325 MG PO TABS
1.0000 | ORAL_TABLET | Freq: Once | ORAL | Status: AC
  Administered 2011-03-30: 1 via ORAL
  Filled 2011-03-30: qty 1

## 2011-03-30 NOTE — ED Provider Notes (Signed)
History    Scribed for Glynn Octave, MD, the patient was seen in room APA16A/APA16A. This chart was scribed by Katha Cabal. This patient's care was started at 16:46.    CSN: [id number] Arrival date & time: 03/30/2011  4:32 PM  Chief Complaint  Patient presents with  . Medical Clearance    (Consider location/radiation/quality/duration/timing/severity/associated sxs/prior treatment) HPI  [name] is a 40 y.o. female who presents to the Emergency Department complaining of SI with associated hearing voices that tell her to kill herself that began 3 days ago.  Patient recently started taking Lamictal.  Reports taking all medications.  Denies fever, vomiting, HI, plan of suicide, weakness, numbness, tingling, bowel and bladder incontinence, and leg pain.  Patient reports ongoing lower back pain for several years.  Patient takes Percocet for pain.  Patient is treated at [facility] by Dr. Betti Cruz.     PAST MEDICAL HISTORY:  Past Medical History  Diagnosis Date  . Migraine     PAST SURGICAL HISTORY:  Past Surgical History  Procedure Date  . Abdominal hysterectomy   . Knee surgery     FAMILY HISTORY:  History reviewed. No pertinent family history.   SOCIAL HISTORY: History   Social History  . Marital Status: Married    Spouse Name: N/A    Number of Children: N/A  . Years of Education: N/A   Social History Main Topics  . Smoking status: Current Everyday Smoker -- 0.5 packs/day    Types: Cigarettes  . Smokeless tobacco: None  . Alcohol Use: No  . Drug Use: No  . Sexually Active: None   Other Topics Concern  . None   Social History Narrative  . None    Review of Systems 10 Systems reviewed and are negative for acute change except as noted in the HPI.  Allergies  Haldol; Penicillins; Darvocet; Haloperidol and related; and Imitrex  Home Medications   Current Outpatient Rx  Name Route Sig Dispense Refill  . ALPRAZOLAM 1 MG PO TABS Oral Take 1 mg by mouth at  bedtime as needed. For anxiety    . DIVALPROEX SODIUM 500 MG PO TBEC Oral Take 500 mg by mouth 2 (two) times daily.      Marland Kitchen GABAPENTIN 300 MG PO CAPS Oral Take 300 mg by mouth 3 (three) times daily.      . IBUPROFEN 200 MG PO TABS Oral Take 400 mg by mouth every 6 (six) hours as needed. Pain     . LAMOTRIGINE 25 MG PO TABS Oral Take 25 mg by mouth daily.      . MULTIVITAMIN PO Oral Take 1 tablet by mouth daily.      Marland Kitchen NAPROXEN SODIUM 220 MG PO TABS Oral Take 220 mg by mouth 2 (two) times daily as needed. Pain     . OXYCODONE-ACETAMINOPHEN 10-650 MG PO TABS Oral Take 1 tablet by mouth every 6 (six) hours as needed. Pain     . PROMETHAZINE HCL 50 MG PO TABS Oral Take 50 mg by mouth every 6 (six) hours as needed. Nausea     . RISPERIDONE 3 MG PO TABS Oral Take 3 mg by mouth at bedtime.      Marland Kitchen RISPERIDONE 3 MG PO TABS  3 mg at bedtime.      . TRAZODONE HCL 150 MG PO TABS Oral Take 150 mg by mouth at bedtime.      Marland Kitchen LORAZEPAM 1 MG PO TABS Oral Take 1 mg by mouth every  8 (eight) hours. For anxiety     . SERTRALINE HCL 50 MG PO TABS Oral Take 50 mg by mouth daily.        BP 123/75  Pulse 72  Temp(Src) 98.8 F (37.1 C) (Oral)  Resp 16  SpO2 97%  LMP 03/22/2011  Physical Exam  Nursing note and vitals reviewed. Constitutional: She is oriented to person, place, and time. She appears well-developed and well-nourished.  HENT:  Head: Normocephalic.  Eyes: Pupils are equal, round, and reactive to light.  Neck: Neck supple.  Cardiovascular: Normal rate and regular rhythm.   No murmur heard. Pulmonary/Chest: Effort normal. No respiratory distress.  Abdominal: Soft. There is no tenderness.  Musculoskeletal: Normal range of motion.       No extremity deformity.    Neurological: She is alert and oriented to person, place, and time. No sensory deficit.       5/5 strength, Neg straight leg raise, bilateral lower extremity DTR nlm  Skin: Skin is warm and dry. No rash noted.  Psychiatric: Her behavior  is normal. She expresses suicidal ideation. She expresses no homicidal ideation. She expresses no suicidal plans and no homicidal plans.    ED Course  Procedures (including critical care time)  Labs Reviewed  CBC - Abnormal; Notable for the following:    WBC 14.1 (*)    Hemoglobin 15.1 (*)    All other components within normal limits  BASIC METABOLIC PANEL - Abnormal; Notable for the following:    Glucose, Bld 105 (*)    All other components within normal limits  URINE RAPID DRUG SCREEN (HOSP PERFORMED) - Abnormal; Notable for the following:    Benzodiazepines POSITIVE (*)    Tetrahydrocannabinol POSITIVE (*)    All other components within normal limits  ETHANOL  PREGNANCY, URINE   OTHER DATA REVIEWED: Nursing notes, vital signs, and past medical records reviewed.   DIAGNOSTIC STUDIES: Oxygen Saturation is 97% on room air, normal by my interpretation.      LABS / RADIOLOGY:  Results for orders placed during the hospital encounter of 03/30/11  CBC      Component Value Range   WBC 14.1 (*) 4.0 - 10.5 (K/uL)   RBC 5.00  3.87 - 5.11 (MIL/uL)   Hemoglobin 15.1 (*) 12.0 - 15.0 (g/dL)   HCT 16.1  09.6 - 04.5 (%)   MCV 88.0  78.0 - 100.0 (fL)   MCH 30.2  26.0 - 34.0 (pg)   MCHC 34.3  30.0 - 36.0 (g/dL)   RDW 40.9  81.1 - 91.4 (%)   Platelets 320  150 - 400 (K/uL)  BASIC METABOLIC PANEL      Component Value Range   Sodium 138  135 - 145 (mEq/L)   Potassium 4.4  3.5 - 5.1 (mEq/L)   Chloride 99  96 - 112 (mEq/L)   CO2 30  19 - 32 (mEq/L)   Glucose, Bld 105 (*) 70 - 99 (mg/dL)   BUN 13  6 - 23 (mg/dL)   Creatinine, Ser 7.82  0.50 - 1.10 (mg/dL)   Calcium 9.5  8.4 - 95.6 (mg/dL)   GFR calc non Af Amer >90  >90 (mL/min)   GFR calc Af Amer >90  >90 (mL/min)  ETHANOL      Component Value Range   Alcohol, Ethyl (B) <11  0 - 11 (mg/dL)  PREGNANCY, URINE      Component Value Range   Preg Test, Ur NEGATIVE    URINE RAPID DRUG  SCREEN (HOSP PERFORMED)      Component Value  Range   Opiates NONE DETECTED  NONE DETECTED    Cocaine NONE DETECTED  NONE DETECTED    Benzodiazepines POSITIVE (*) NONE DETECTED    Amphetamines NONE DETECTED  NONE DETECTED    Tetrahydrocannabinol POSITIVE (*) NONE DETECTED    Barbiturates NONE DETECTED  NONE DETECTED        ED COURSE / COORDINATION OF CARE: 4:54 PM  Physical exam complete.  Will contact behavior health.  Pain Control and nicotine patch.    Orders Placed This Encounter  Procedures  . CBC  . Basic metabolic panel  . Ethanol  . Pregnancy, urine  . Urine rapid drug screen (hosp performed)         MDM   MDM: Suicidal with auditory hallucinations telling her to kill herself. Recent admission to [facility] last week. Chronic back pain at baseline without any neurological deficit. We'll obtain screening labs before contacting ACT team.   IMPRESSION: Diagnoses that have been ruled out:  Diagnoses that are still under consideration:  Final diagnoses:     MEDICATIONS GIVEN IN THE E.D. Scheduled Meds:    . nicotine  14 mg Transdermal Once  . oxyCODONE-acetaminophen  2 tablet Oral Once   Continuous Infusions:      I personally performed the services described in this documentation, which was scribed in my presence.  The recorded information has been reviewed and considered.         Glynn Octave, MD 03/31/11 0000

## 2011-03-30 NOTE — ED Notes (Signed)
Pt with [facility]. 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 Pt agrees with this and stats she is taking her meds like she supposed to. Pt is alert/oriented and cooperative at this time. Nad. C/o lower chronic back pain.

## 2011-03-31 NOTE — ED Notes (Signed)
Seen and evaluated by ACT team.  PAtient has not heard voices x 2 days.  Sent from [facility] without IVC.  PAtient is contracting for safety and has no intention of killing self.  Stable for followup with [facility].   Glynn Octave, MD 03/31/11 [phone]

## 2011-05-01 ENCOUNTER — Encounter (HOSPITAL_COMMUNITY): Payer: Self-pay | Admitting: *Deleted

## 2011-05-01 ENCOUNTER — Emergency Department (HOSPITAL_COMMUNITY)
Admission: EM | Admit: 2011-05-01 | Discharge: 2011-05-01 | Disposition: A | Discharge status: Home / Self Care | Payer: Self-pay | Attending: Emergency Medicine | Admitting: Emergency Medicine

## 2011-05-01 DIAGNOSIS — G43909 Migraine, unspecified, not intractable, without status migrainosus: Secondary | ICD-10-CM | POA: Insufficient documentation

## 2011-05-01 DIAGNOSIS — F17200 Nicotine dependence, unspecified, uncomplicated: Secondary | ICD-10-CM | POA: Insufficient documentation

## 2011-05-01 MED ORDER — OXYCODONE-ACETAMINOPHEN 5-325 MG PO TABS
2.0000 | ORAL_TABLET | Freq: Once | ORAL | Status: AC
  Administered 2011-05-01: 2 via ORAL
  Filled 2011-05-01: qty 2

## 2011-05-01 NOTE — ED Notes (Signed)
Pt requested lights be out in room. NAD noted. Pt reports she is out of her migraine med.

## 2011-05-01 NOTE — ED Provider Notes (Signed)
History     CSN: [id number] Arrival date & time: 05/01/2011  2:58 PM   First MD Initiated Contact with Patient 05/01/11 1508      Chief Complaint  Patient presents with  . Migraine   history of present illness: complains of typical migraine headache gradual in onset yesterday pain is behind right eye accompanied by nausea has vomited 3 times no nausea at present treated with Xanax which cured her nausea pain is nonradiating typical of migraine she's had in the past no other associated symptoms. Patient states she ran out of Percocet which he typically takes for migraines. She called Dr.Doonqua today however he could not see her in the office.  (Consider location/radiation/quality/duration/timing/severity/associated sxs/prior treatment) HPI  Past Medical History  Diagnosis Date  . Migraine     Past Surgical History  Procedure Date  . Abdominal hysterectomy   . Knee surgery     History reviewed. No pertinent family history.  History  Substance Use Topics  . Smoking status: Current Everyday Smoker -- 0.5 packs/day    Types: Cigarettes  . Smokeless tobacco: Not on file  . Alcohol Use: No    OB History    Grav Para Term Preterm Abortions TAB SAB Ect Mult Living                  Review of Systems  Constitutional: Negative.   Respiratory: Negative.   Cardiovascular: Negative.   Gastrointestinal: Positive for nausea and vomiting.  Musculoskeletal: Negative.   Skin: Negative.   Neurological: Positive for headaches.  Hematological: Negative.   Psychiatric/Behavioral: Negative.   All other systems reviewed and are negative.    Allergies  Haldol; Penicillins; Darvocet; Haloperidol and related; and Imitrex  Home Medications   Current Outpatient Rx  Name Route Sig Dispense Refill  . ALPRAZOLAM 1 MG PO TABS Oral Take 1 mg by mouth at bedtime as needed. For anxiety    . DIVALPROEX SODIUM 500 MG PO TBEC Oral Take 500 mg by mouth 2 (two) times daily.      Marland Kitchen GABAPENTIN  300 MG PO CAPS Oral Take 300 mg by mouth 3 (three) times daily.      Marland Kitchen LAMOTRIGINE 25 MG PO TABS Oral Take 25 mg by mouth daily.      Marland Kitchen LORAZEPAM 1 MG PO TABS Oral Take 1 mg by mouth every 8 (eight) hours. For anxiety     . MULTIVITAMIN PO Oral Take 1 tablet by mouth daily.      Marland Kitchen NAPROXEN SODIUM 220 MG PO TABS Oral Take 220 mg by mouth 2 (two) times daily as needed. Pain     . OXYCODONE-ACETAMINOPHEN 10-650 MG PO TABS Oral Take 1 tablet by mouth every 6 (six) hours as needed. Pain     . PROMETHAZINE HCL 50 MG PO TABS Oral Take 50 mg by mouth every 6 (six) hours as needed. Nausea     . RISPERIDONE 3 MG PO TABS Oral Take 3 mg by mouth at bedtime.      Marland Kitchen RISPERIDONE 3 MG PO TABS  3 mg at bedtime.      . SERTRALINE HCL 50 MG PO TABS Oral Take 50 mg by mouth every morning.     . TRAZODONE HCL 150 MG PO TABS Oral Take 150 mg by mouth at bedtime.        BP 120/76  Pulse 73  Temp(Src) 97.6 F (36.4 C) (Oral)  Resp 20  Ht 5\' 3"  (1.6 m)  Wt  150 lb (68.04 kg)  BMI 26.57 kg/m2  SpO2 99%  LMP 03/22/2011  Physical Exam  Nursing note and vitals reviewed. Constitutional: She appears well-developed and well-nourished.  HENT:  Head: Normocephalic and atraumatic.  Eyes: Conjunctivae are normal. Pupils are equal, round, and reactive to light.       Fundi benign  Neck: Neck supple. No tracheal deviation present. No thyromegaly present.  Cardiovascular: Normal rate and regular rhythm.   No murmur heard. Pulmonary/Chest: Effort normal and breath sounds normal.  Abdominal: Soft. Bowel sounds are normal. She exhibits no distension. There is no tenderness.  Musculoskeletal: Normal range of motion. She exhibits no edema and no tenderness.  Neurological: She is alert. She has normal reflexes. No cranial nerve deficit. Coordination normal.       Gait normal pronator drift normal Romberg normal  Skin: Skin is warm and dry. No rash noted.  Psychiatric: She has a normal mood and affect.    ED Course    Procedures (including critical care time)  Labs Reviewed - No data to display No results found.   No diagnosis found.    MDM  Plan Percocet 2 tablets in the emergency department no prescriptions written. Followup Dr. Judithann Sheen tomorrow if symptoms continue Diagnosis migraine headache        Doug Sou, MD 05/01/11 1530

## 2011-05-01 NOTE — ED Notes (Signed)
Pt c/o migraine that started last night

## 2011-05-03 ENCOUNTER — Emergency Department (HOSPITAL_COMMUNITY)
Admission: EM | Admit: 2011-05-03 | Discharge: 2011-05-03 | Disposition: A | Discharge status: Home / Self Care | Payer: Self-pay | Attending: Emergency Medicine | Admitting: Emergency Medicine

## 2011-05-03 ENCOUNTER — Encounter (HOSPITAL_COMMUNITY): Payer: Self-pay | Admitting: Emergency Medicine

## 2011-05-03 DIAGNOSIS — R55 Syncope and collapse: Secondary | ICD-10-CM | POA: Insufficient documentation

## 2011-05-03 DIAGNOSIS — R51 Headache: Secondary | ICD-10-CM | POA: Insufficient documentation

## 2011-05-03 MED ORDER — OXYCODONE-ACETAMINOPHEN 5-325 MG PO TABS: 1.0000 | ORAL_TABLET | ORAL | Status: AC | PRN

## 2011-05-03 MED ORDER — ONDANSETRON 8 MG PO TBDP
8.0000 mg | ORAL_TABLET | Freq: Once | ORAL | Status: AC
  Administered 2011-05-03: 8 mg via ORAL
  Filled 2011-05-03: qty 1

## 2011-05-03 MED ORDER — OXYCODONE-ACETAMINOPHEN 5-325 MG PO TABS
2.0000 | ORAL_TABLET | Freq: Once | ORAL | Status: AC
  Administered 2011-05-03: 2 via ORAL
  Filled 2011-05-03: qty 2

## 2011-05-03 NOTE — ED Provider Notes (Signed)
History     CSN: [id number] Arrival date & time: 05/03/2011  4:21 PM   First MD Initiated Contact with Patient 05/03/11 1651      Chief Complaint  Patient presents with  . Headache    (Consider location/radiation/quality/duration/timing/severity/associated sxs/prior treatment) HPI Comments: Patient c/o migraine headache.  Was seen here 2 days ago for same.  States headache improved slightly but never resolved.  Headche is similar to previous headaches  Patient is a 40 y.o. female presenting with headaches. The history is provided by the patient.  Headache  This is a recurrent problem. The current episode started more than 2 days ago. The problem occurs constantly. The problem has not changed since onset.The headache is associated with bright light. The pain is located in the frontal region. The pain is moderate. The pain does not radiate. Associated symptoms include nausea. Pertinent negatives include no anorexia, no fever, no malaise/fatigue, no near-syncope, no orthopnea, no palpitations, no shortness of breath and no vomiting. She has tried oral narcotic analgesics for the symptoms. The treatment provided moderate relief.    Past Medical History  Diagnosis Date  . Migraine     Past Surgical History  Procedure Date  . Abdominal hysterectomy   . Knee surgery     History reviewed. No pertinent family history.  History  Substance Use Topics  . Smoking status: Current Everyday Smoker -- 0.5 packs/day    Types: Cigarettes  . Smokeless tobacco: Not on file  . Alcohol Use: No    OB History    Grav Para Term Preterm Abortions TAB SAB Ect Mult Living                  Review of Systems  Constitutional: Negative for fever, chills, malaise/fatigue, activity change, appetite change and fatigue.  HENT: Negative for sore throat, facial swelling, trouble swallowing, neck pain and neck stiffness.   Respiratory: Negative for shortness of breath and wheezing.   Cardiovascular:  Negative for chest pain, palpitations, orthopnea and near-syncope.  Gastrointestinal: Positive for nausea. Negative for vomiting, abdominal pain and anorexia.  Genitourinary: Negative for dysuria, hematuria and flank pain.  Musculoskeletal: Negative for myalgias, back pain and arthralgias.  Skin: Negative for rash.  Neurological: Positive for headaches. Negative for dizziness, speech difficulty, weakness and numbness.  Hematological: Negative for adenopathy. Does not bruise/bleed easily.  Psychiatric/Behavioral: Negative for decreased concentration.  All other systems reviewed and are negative.    Allergies  Haldol; Penicillins; Darvocet; Haloperidol and related; and Imitrex  Home Medications   Current Outpatient Rx  Name Route Sig Dispense Refill  . ACETAMINOPHEN 500 MG PO TABS Oral Take 500 mg by mouth as needed. For pain     . ALPRAZOLAM 1 MG PO TABS Oral Take 1 mg by mouth at bedtime as needed. For anxiety    . DIVALPROEX SODIUM 500 MG PO TBEC Oral Take 500 mg by mouth 2 (two) times daily.      Marland Kitchen GABAPENTIN 300 MG PO CAPS Oral Take 300 mg by mouth 3 (three) times daily.      Marland Kitchen LAMOTRIGINE 25 MG PO TABS Oral Take 25 mg by mouth daily.      . MULTIVITAMIN PO Oral Take 1 tablet by mouth daily.      Marland Kitchen RISPERIDONE 3 MG PO TABS Oral Take 3 mg by mouth at bedtime.      . SERTRALINE HCL 50 MG PO TABS Oral Take 50 mg by mouth every morning.     Marland Kitchen  TRAZODONE HCL 150 MG PO TABS Oral Take 150 mg by mouth at bedtime.      Marland Kitchen LORAZEPAM 1 MG PO TABS Oral Take 1 mg by mouth every 8 (eight) hours. For anxiety       BP 130/79  Pulse 60  Temp(Src) 98.5 F (36.9 C) (Oral)  Resp 18  Ht 5\' 3"  (1.6 m)  Wt 150 lb (68.04 kg)  BMI 26.57 kg/m2  SpO2 100%  LMP 03/22/2011  Physical Exam  Nursing note and vitals reviewed. Constitutional: She is oriented to person, place, and time. She appears well-developed and well-nourished.  Non-toxic appearance. She does not have a sickly appearance. She does not  appear ill. No distress.  HENT:  Head: Normocephalic and atraumatic. No trismus in the jaw.  Right Ear: Tympanic membrane normal.  Left Ear: Tympanic membrane normal.  Mouth/Throat: Uvula is midline, oropharynx is clear and moist and mucous membranes are normal. No uvula swelling.  Neck: Normal range of motion. Neck supple.  Cardiovascular: Normal rate, regular rhythm and normal heart sounds.   Pulmonary/Chest: Effort normal and breath sounds normal. No respiratory distress. She exhibits no tenderness.  Abdominal: Soft. She exhibits no distension. There is no tenderness.  Musculoskeletal: Normal range of motion. She exhibits no tenderness.  Lymphadenopathy:    She has no cervical adenopathy.  Neurological: She is alert and oriented to person, place, and time. She displays normal reflexes. No cranial nerve deficit. She exhibits normal muscle tone. Coordination normal.  Skin: Skin is warm and dry.  Psychiatric: She has a normal mood and affect.    ED Course  Procedures (including critical care time)       MDM   5:45 PM  Patient is alert, NAD.  Non-toxic appearing,  Vitals stable.  No meningeal signs, no fever.  Right sided headache with hx of migraines.  States pain today is similar to previous headaches.    6:13 PM patient is feeling better, headache improved and nausea resolved.  Has appt with Dr. Gerilyn Pilgrim on 05/10/11 and was seen here 2 days ago for same.    I have reviewed the previous ED chart.  Pt feels improved after observation and/or treatment in ED.   Patient / Family / Caregiver understand and agree with initial ED impression and plan with expectations set for ED visit.     Terril Chestnut L. Altagracia Rone, [place] 05/04/11 2019

## 2011-05-03 NOTE — ED Notes (Signed)
Pt given coke per her request.

## 2011-05-03 NOTE — ED Notes (Signed)
Pt c/o headache x 5 days. Pt seen in ed on Monday for same.

## 2011-05-03 NOTE — ED Notes (Signed)
Headache for about 5 days, was seen in er Monday given percocet's while she was in er that helped only temporary, pain came back, has not been able to see pcp due to not having any money to afford visit. Pt states that she was not given prescription for percocet's and that it usually takes a few days of taking the pain medication for the migraine to get better.

## 2011-05-05 NOTE — ED Provider Notes (Signed)
Medical screening examination/treatment/procedure(s) were performed by non-physician practitioner and as supervising physician I was immediately available for consultation/collaboration.   Joya Gaskins, MD 05/05/11 [phone]
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
## 2011-05-18 ENCOUNTER — Emergency Department (HOSPITAL_COMMUNITY)
Admission: EM | Admit: 2011-05-18 | Discharge: 2011-05-18 | Disposition: A | Discharge status: Home / Self Care | Payer: Self-pay | Attending: Emergency Medicine | Admitting: Emergency Medicine

## 2011-05-18 ENCOUNTER — Encounter (HOSPITAL_COMMUNITY): Payer: Self-pay | Admitting: *Deleted

## 2011-05-18 DIAGNOSIS — F17200 Nicotine dependence, unspecified, uncomplicated: Secondary | ICD-10-CM | POA: Insufficient documentation

## 2011-05-18 DIAGNOSIS — Z9079 Acquired absence of other genital organ(s): Secondary | ICD-10-CM | POA: Insufficient documentation

## 2011-05-18 DIAGNOSIS — G43909 Migraine, unspecified, not intractable, without status migrainosus: Secondary | ICD-10-CM | POA: Insufficient documentation

## 2011-05-18 MED ORDER — ONDANSETRON 8 MG PO TBDP
8.0000 mg | ORAL_TABLET | Freq: Once | ORAL | Status: AC
  Administered 2011-05-18: 8 mg via ORAL
  Filled 2011-05-18: qty 1

## 2011-05-18 MED ORDER — OXYCODONE-ACETAMINOPHEN 5-325 MG PO TABS
2.0000 | ORAL_TABLET | Freq: Once | ORAL | Status: AC
  Administered 2011-05-18: 2 via ORAL
  Filled 2011-05-18: qty 1

## 2011-05-18 MED ORDER — OXYCODONE-ACETAMINOPHEN 5-325 MG PO TABS: 1.0000 | ORAL_TABLET | ORAL | Status: AC | PRN

## 2011-05-18 NOTE — ED Notes (Signed)
Patient is requesting to leave at this time, PA informed

## 2011-05-18 NOTE — ED Notes (Signed)
Pt c/o migraine since 3am today; pt c/o nausea

## 2011-05-18 NOTE — ED Provider Notes (Signed)
History     CSN: [id number] Arrival date & time: 05/18/2011 10:12 AM   First MD Initiated Contact with Patient 05/18/11 1017      Chief Complaint  Patient presents with  . Migraine    (Consider location/radiation/quality/duration/timing/severity/associated sxs/prior treatment) Patient is a 40 y.o. female presenting with migraine. The history is provided by the patient.  Migraine This is a recurrent problem. The current episode started today (Patient woke at 3 am to start her [place] for the holiday,  when she developed return of her recurrent migrain headache.). The problem occurs constantly. The problem has been unchanged. Associated symptoms include headaches. Pertinent negatives include no abdominal pain, arthralgias, chest pain, chills, congestion, fever, joint swelling, nausea, neck pain, numbness, rash, sore throat or weakness. Associated symptoms comments: She does report nausea without vomiting,  Also with photophobia and phonophobia.  She does have occasional small flashed of light in her visual fields which is also consistent with previous migraine episodes.. The symptoms are aggravated by nothing. She has tried acetaminophen for the symptoms. The treatment provided no relief.    Past Medical History  Diagnosis Date  . Migraine     Past Surgical History  Procedure Date  . Abdominal hysterectomy   . Knee surgery     History reviewed. No pertinent family history.  History  Substance Use Topics  . Smoking status: Current Everyday Smoker -- 0.5 packs/day    Types: Cigarettes  . Smokeless tobacco: Not on file  . Alcohol Use: No    OB History    Grav Para Term Preterm Abortions TAB SAB Ect Mult Living                  Review of Systems  Constitutional: Negative for fever and chills.  HENT: Negative for congestion, sore throat and neck pain.   Eyes: Negative.   Respiratory: Negative for chest tightness and shortness of breath.   Cardiovascular: Negative for chest  pain.  Gastrointestinal: Negative for nausea and abdominal pain.  Genitourinary: Negative.   Musculoskeletal: Negative for joint swelling and arthralgias.  Skin: Negative.  Negative for rash and wound.  Neurological: Positive for headaches. Negative for dizziness, speech difficulty, weakness, light-headedness and numbness.  Hematological: Negative.   Psychiatric/Behavioral: Negative.     Allergies  Haldol; Penicillins; Darvocet; and Imitrex  Home Medications   Current Outpatient Rx  Name Route Sig Dispense Refill  . ACETAMINOPHEN 500 MG PO TABS Oral Take 500 mg by mouth as needed. For pain     . ALPRAZOLAM 1 MG PO TABS Oral Take 1 mg by mouth at bedtime as needed. For anxiety    . DIVALPROEX SODIUM 500 MG PO TBEC Oral Take 500 mg by mouth 2 (two) times daily.      Marland Kitchen GABAPENTIN 300 MG PO CAPS Oral Take 300 mg by mouth 3 (three) times daily.      Marland Kitchen LAMOTRIGINE 25 MG PO TABS Oral Take 25 mg by mouth daily.      Marland Kitchen LORAZEPAM 1 MG PO TABS Oral Take 1 mg by mouth every 8 (eight) hours as needed. For anxiety    . MULTIVITAMIN PO Oral Take 1 tablet by mouth daily.      . OXYCODONE-ACETAMINOPHEN 10-325 MG PO TABS Oral Take 1 tablet by mouth every 4 (four) hours as needed. Pain     . RISPERIDONE 3 MG PO TABS Oral Take 3 mg by mouth at bedtime.      . SERTRALINE HCL 50 MG  PO TABS Oral Take 50 mg by mouth every morning.     . TRAZODONE HCL 150 MG PO TABS Oral Take 150 mg by mouth at bedtime.        BP 131/67  Pulse 100  Temp(Src) 98.4 F (36.9 C) (Oral)  Resp 18  Ht 5\' 3"  (1.6 m)  Wt 150 lb (68.04 kg)  BMI 26.57 kg/m2  SpO2 99%  LMP 03/22/2011  Physical Exam  Nursing note and vitals reviewed. Constitutional: She is oriented to person, place, and time. She appears well-developed and well-nourished.       Uncomfortable appearing  HENT:  Head: Normocephalic and atraumatic.  Mouth/Throat: Oropharynx is clear and moist.  Eyes: EOM are normal. Pupils are equal, round, and reactive to  light.  Neck: Normal range of motion. Neck supple.  Cardiovascular: Normal rate and normal heart sounds.   Pulmonary/Chest: Effort normal.  Abdominal: Soft. There is no tenderness.  Musculoskeletal: Normal range of motion.  Lymphadenopathy:    She has no cervical adenopathy.  Neurological: She is alert and oriented to person, place, and time. She has normal strength. She displays normal reflexes. No cranial nerve deficit or sensory deficit. She exhibits normal muscle tone. She displays a negative Romberg sign. Gait normal. GCS eye subscore is 4. GCS verbal subscore is 5. GCS motor subscore is 6.       Normal rapid alternating movements.  Skin: Skin is warm and dry. No rash noted.  Psychiatric: She has a normal mood and affect. Her speech is normal and behavior is normal. Thought content normal. Cognition and memory are normal.    ED Course  Procedures (including critical care time)  Labs Reviewed - No data to display No results found.   No diagnosis found.    MDM  Patient states normally sees Dr. Gerilyn Pilgrim for migraine management.  Has not been able to see due to financial reasons.  She has her other migraine medicines and is taking these,  But has run of of percocet,  Which is the only med that seems to work when she has acute migraine.   Percocet 2 tabs given in ed,  Along with zofran 8 mg odt.        Candis Musa, PA 05/18/11 1155

## 2011-05-18 NOTE — ED Notes (Signed)
Denies any relief on discharge and is being discharged per request

## 2011-05-21 NOTE — ED Provider Notes (Signed)
Evaluation and management procedures were performed by the PA/NP under my supervision/collaboration.    Felisa Bonier, MD 05/21/11 [phone]

## 2011-05-29 ENCOUNTER — Encounter (HOSPITAL_COMMUNITY): Payer: Self-pay | Admitting: Emergency Medicine

## 2011-05-29 ENCOUNTER — Emergency Department (HOSPITAL_COMMUNITY)
Admission: EM | Admit: 2011-05-29 | Discharge: 2011-05-29 | Disposition: A | Discharge status: Home / Self Care | Payer: Self-pay | Attending: Emergency Medicine | Admitting: Emergency Medicine

## 2011-05-29 DIAGNOSIS — G43909 Migraine, unspecified, not intractable, without status migrainosus: Secondary | ICD-10-CM | POA: Insufficient documentation

## 2011-05-29 DIAGNOSIS — F17200 Nicotine dependence, unspecified, uncomplicated: Secondary | ICD-10-CM | POA: Insufficient documentation

## 2011-05-29 DIAGNOSIS — R51 Headache: Secondary | ICD-10-CM

## 2011-05-29 HISTORY — DX: Unspecified convulsions: R56.9

## 2011-05-29 MED ORDER — OXYCODONE-ACETAMINOPHEN 5-325 MG PO TABS
2.0000 | ORAL_TABLET | Freq: Once | ORAL | Status: AC
  Administered 2011-05-29: 2 via ORAL
  Filled 2011-05-29: qty 2

## 2011-05-29 MED ORDER — ONDANSETRON 4 MG PO TBDP
4.0000 mg | ORAL_TABLET | Freq: Once | ORAL | Status: AC
  Administered 2011-05-29: 4 mg via ORAL
  Filled 2011-05-29: qty 1

## 2011-05-29 NOTE — ED Notes (Signed)
Pt c/o migraine which onset was 12 am. Pt reports having right frontal area pain which radiates down to her neck. Pt reports photophobia, and sensitivity to smells. Pt reports nausea, denies any vomiting at this time.

## 2011-05-29 NOTE — ED Notes (Signed)
Pt reports pain gradually improving with medication. Pt now reports pain 5/10.

## 2011-05-29 NOTE — ED Notes (Addendum)
Pt complains of migraine that started around 12 am. Pt reports right frontal area pain that radiates down to her neck. Pt reports feeling nausea, denies any vomiting. Pt reports photophobia, and sensitivity to smells.

## 2011-05-29 NOTE — ED Provider Notes (Signed)
History    This chart was scribed for [employer]. Colon Branch, MD, MD by Smitty Pluck. The patient was seen in room APA07 and the patient's care was started at 8:19AM.   CSN: [id number] Arrival date & time: 05/29/2011  7:34 AM   First MD Initiated Contact with Patient 05/29/11 0802      Chief Complaint  Patient presents with  . Migraine    (Consider location/radiation/quality/duration/timing/severity/associated sxs/prior treatment) Patient is a 40 y.o. female presenting with migraine. The history is provided by the patient.  Migraine   [name] is a 40 y.o. female who presents to the Emergency Department complaining of moderate persistent right side migraine pain radiating from head to neck onset today 8 hours ago. Pt has taken topamax without relief. Pt reports nausea, neck stiffness and aura. Pt denies vomiting. PCP is Dr. Gerilyn Pilgrim   Past Medical History  Diagnosis Date  . Migraine     Past Surgical History  Procedure Date  . Knee surgery   . Cesarean section     History reviewed. No pertinent family history.  History  Substance Use Topics  . Smoking status: Current Everyday Smoker -- 0.5 packs/day    Types: Cigarettes  . Smokeless tobacco: Not on file  . Alcohol Use: No    OB History    Grav Para Term Preterm Abortions TAB SAB Ect Mult Living                  Review of Systems  All other systems reviewed and are negative.  10 Systems reviewed and are negative for acute change except as noted in the HPI.   Allergies  Haldol; Penicillins; Darvocet; and Imitrex  Home Medications   Current Outpatient Rx  Name Route Sig Dispense Refill  . ACETAMINOPHEN 500 MG PO TABS Oral Take 500 mg by mouth as needed. For pain     . ALPRAZOLAM 1 MG PO TABS Oral Take 1 mg by mouth at bedtime as needed. For anxiety    . DIVALPROEX SODIUM 500 MG PO TBEC Oral Take 500 mg by mouth 2 (two) times daily.      Marland Kitchen GABAPENTIN 300 MG PO CAPS Oral Take 300 mg by mouth 3 (three) times  daily.      Marland Kitchen LAMOTRIGINE 25 MG PO TABS Oral Take 25 mg by mouth daily.      Marland Kitchen LORAZEPAM 1 MG PO TABS Oral Take 1 mg by mouth every 8 (eight) hours as needed. For anxiety    . MULTIVITAMIN PO Oral Take 1 tablet by mouth daily.      . OXYCODONE-ACETAMINOPHEN 10-325 MG PO TABS Oral Take 1 tablet by mouth every 4 (four) hours as needed. Pain     . OXYCODONE-ACETAMINOPHEN 5-325 MG PO TABS Oral Take 1 tablet by mouth every 4 (four) hours as needed for pain. 15 tablet 0  . RISPERIDONE 3 MG PO TABS Oral Take 3 mg by mouth at bedtime.      . SERTRALINE HCL 50 MG PO TABS Oral Take 50 mg by mouth every morning.     . TRAZODONE HCL 150 MG PO TABS Oral Take 150 mg by mouth at bedtime.        BP 126/73  Pulse 74  Temp(Src) 98.3 F (36.8 C) (Oral)  Resp 16  Ht 5\' 3"  (1.6 m)  Wt 150 lb (68.04 kg)  BMI 26.57 kg/m2  SpO2 97%  LMP 03/22/2011  Physical Exam  Nursing note and vitals reviewed.  Constitutional: She is oriented to person, place, and time. She appears well-developed and well-nourished. No distress.  HENT:  Head: Normocephalic and atraumatic.  Right Ear: External ear normal.  Left Ear: External ear normal.  Eyes: Conjunctivae and EOM are normal. Pupils are equal, round, and reactive to light.  Neck: Normal range of motion. Neck supple. No tracheal deviation present.  Cardiovascular: Normal rate, regular rhythm and normal heart sounds.  Exam reveals no friction rub.   No murmur heard. Pulmonary/Chest: Effort normal and breath sounds normal. No respiratory distress.  Abdominal: Soft. Bowel sounds are normal. She exhibits no distension. There is no tenderness.  Neurological: She is alert and oriented to person, place, and time. She has normal reflexes. No cranial nerve deficit.       Nl grip strength Nl cranial nerves  Skin: Skin is warm and dry.  Psychiatric: She has a normal mood and affect. Her behavior is normal.    ED Course  Procedures (including critical care time)  DIAGNOSTIC  STUDIES: Oxygen Saturation is 97% on room air, normal by my interpretation.    COORDINATION OF CARE:       MDM  Patient with h/o migraine headaches here with headache that began at midnight. She is followed by Dr. Gerilyn Pilgrim who is her pain management doctor. Given analgesics of her choice. Pt feels improved after observation and/or treatment in ED.Pt stable in ED with no significant deterioration in condition.The patient appears reasonably screened and/or stabilized for discharge and I doubt any other medical condition or other [facility] requiring further screening, evaluation, or treatment in the ED at this time prior to discharge.  I personally performed the services described in this documentation, which was scribed in my presence. The recorded information has been reviewed and considered.  MDM Reviewed: previous chart, nursing note and vitals        Nicoletta Dress. Colon Branch, MD 05/29/11 [phone]

## 2011-06-10 ENCOUNTER — Emergency Department (HOSPITAL_COMMUNITY)
Admission: EM | Admit: 2011-06-10 | Discharge: 2011-06-10 | Disposition: A | Discharge status: Home / Self Care | Payer: Self-pay | Attending: Emergency Medicine | Admitting: Emergency Medicine

## 2011-06-10 ENCOUNTER — Encounter (HOSPITAL_COMMUNITY): Payer: Self-pay | Admitting: Emergency Medicine

## 2011-06-10 DIAGNOSIS — R569 Unspecified convulsions: Secondary | ICD-10-CM | POA: Insufficient documentation

## 2011-06-10 DIAGNOSIS — G43909 Migraine, unspecified, not intractable, without status migrainosus: Secondary | ICD-10-CM | POA: Insufficient documentation

## 2011-06-10 DIAGNOSIS — F17200 Nicotine dependence, unspecified, uncomplicated: Secondary | ICD-10-CM | POA: Insufficient documentation

## 2011-06-10 MED ORDER — OXYCODONE-ACETAMINOPHEN 5-325 MG PO TABS
2.0000 | ORAL_TABLET | Freq: Once | ORAL | Status: AC
  Administered 2011-06-10: 2 via ORAL
  Filled 2011-06-10: qty 2

## 2011-06-10 MED ORDER — KETOROLAC TROMETHAMINE 30 MG/ML IJ SOLN
30.0000 mg | Freq: Once | INTRAMUSCULAR | Status: AC
  Administered 2011-06-10: 30 mg via INTRAMUSCULAR
  Filled 2011-06-10: qty 1

## 2011-06-10 MED ORDER — ONDANSETRON 8 MG PO TBDP
8.0000 mg | ORAL_TABLET | Freq: Once | ORAL | Status: AC
  Administered 2011-06-10: 8 mg via ORAL
  Filled 2011-06-10: qty 1

## 2011-06-10 MED ORDER — OXYCODONE-ACETAMINOPHEN 5-325 MG PO TABS: ORAL_TABLET | ORAL | Status: DC

## 2011-06-10 NOTE — ED Notes (Signed)
Pt has been vomiting in the waiting room.

## 2011-06-10 NOTE — ED Provider Notes (Signed)
History     CSN: [id number] Arrival date & time: 06/10/2011  2:46 PM    Chief Complaint  Patient presents with  . Headache   HPI Pt was seen at 1600.  Per pt, c/o gradual onset and persistence of constant acute flair of her chronic migraine headache since yesterday.  Has been assoc with nausea.  Describes the headache as per her usual chronic migraine headache pain pattern for many years.  States the headache began after she ran out of her percocet for pain; requesting a refill.  Denies headache was sudden or maximal in onset or at any time.  Denies visual changes, no focal motor weakness, no tingling/numbness in extremities, no fevers, no neck pain, no rash.  Pt has a significant hx of these symptoms and been eval in the ED multiple times for same.  Neuro:  Dr. Gerilyn Pilgrim Past Medical History  Diagnosis Date  . Migraine   . Seizures     Past Surgical History  Procedure Date  . Knee surgery   . Cesarean section     History  Substance Use Topics  . Smoking status: Current Everyday Smoker -- 0.5 packs/day    Types: Cigarettes  . Smokeless tobacco: Not on file  . Alcohol Use: No   Review of Systems ROS: Statement: All systems negative except as marked or noted in the HPI; Constitutional: Negative for fever and chills. ; ; Eyes: Negative for eye pain, redness and discharge. ; ; ENMT: Negative for ear pain, hoarseness, nasal congestion, sinus pressure and sore throat. ; ; Cardiovascular: Negative for chest pain, palpitations, diaphoresis, dyspnea and peripheral edema. ; ; Respiratory: Negative for cough, wheezing and stridor. ; ; Gastrointestinal: +nausea. Negative for vomiting, diarrhea, abdominal pain, blood in stool, hematemesis, jaundice and rectal bleeding. . ; ; Genitourinary: Negative for dysuria, flank pain and hematuria. ; ; Musculoskeletal: Negative for back pain and neck pain. Negative for swelling and trauma.; ; Skin: Negative for pruritus, rash, abrasions, blisters, bruising  and skin lesion.; ; Neuro: +headache. Negative for lightheadedness and neck stiffness. Negative for weakness, altered level of consciousness , altered mental status, extremity weakness, paresthesias, involuntary movement, seizure and syncope.     Allergies  Haldol; Penicillins; Darvocet; and Imitrex  Home Medications   Current Outpatient Rx  Name Route Sig Dispense Refill  . ACETAMINOPHEN 500 MG PO TABS Oral Take 500 mg by mouth as needed. For pain     . ALPRAZOLAM 1 MG PO TABS Oral Take 1 mg by mouth at bedtime as needed. For anxiety    . DIVALPROEX SODIUM 500 MG PO TBEC Oral Take 500 mg by mouth 2 (two) times daily.      Marland Kitchen GABAPENTIN 300 MG PO CAPS Oral Take 300 mg by mouth 3 (three) times daily.      Marland Kitchen LAMOTRIGINE 25 MG PO TABS Oral Take 25 mg by mouth daily.      Marland Kitchen LORAZEPAM 1 MG PO TABS Oral Take 1 mg by mouth every 8 (eight) hours as needed. For anxiety    . MULTIVITAMIN PO Oral Take 1 tablet by mouth daily.      . OXYCODONE-ACETAMINOPHEN 10-325 MG PO TABS Oral Take 1 tablet by mouth every 4 (four) hours as needed. Pain     . RISPERIDONE 3 MG PO TABS Oral Take 3 mg by mouth at bedtime.      . SERTRALINE HCL 50 MG PO TABS Oral Take 50 mg by mouth every morning.     Marland Kitchen  TRAZODONE HCL 150 MG PO TABS Oral Take 150 mg by mouth at bedtime.        BP 149/68  Pulse 97  Temp(Src) 97.6 F (36.4 C) (Oral)  Resp 18  Ht 5\' 3"  (1.6 m)  Wt 152 lb (68.947 kg)  BMI 26.93 kg/m2  SpO2 100%  LMP 03/22/2011  Physical Exam 1605: Physical examination:  Nursing notes reviewed; Vital signs and O2 SAT reviewed;  Constitutional: Well developed, Well nourished, Well hydrated, In no acute distress; Head:  Normocephalic, atraumatic; Eyes: EOMI, PERRL, No scleral icterus; ENMT: TM's clear bilat, +edemetous nasal turbinates bilat with clear rhinorrhea. Mouth and pharynx normal, Mucous membranes moist; Neck: Supple, Full range of motion, No lymphadenopathy; Cardiovascular: Regular rate and rhythm, No murmur,  rub, or gallop; Respiratory: Breath sounds clear & equal bilaterally, No rales, rhonchi, wheezes, or rub, Normal respiratory effort/excursion; Chest: Nontender, Movement normal; Abdomen: Soft, Nontender, Nondistended, Normal bowel sounds; Extremities: Pulses normal, No tenderness, No edema, No calf edema or asymmetry.; Neuro: AA&Ox3, Major CN grossly intact. Speech clear, no facial droop.  No gross focal motor or sensory deficits in extremities.; Skin: Color normal, Warm, Dry, no rash.    ED Course  Procedures  MDM  MDM Reviewed: previous chart, nursing note and vitals   Pt requesting "a shot" as well as her "usual meds."  Will dose and d/c.    Medications given in ED:  ketorolac (TORADOL) 30 MG/ML injection 30 mg (30 mg Intramuscular Given 06/10/11 1643)  oxyCODONE-acetaminophen (PERCOCET) 5-325 MG per tablet 2 tablet (2 tablet Oral Given 06/10/11 1645)  ondansetron (ZOFRAN-ODT) disintegrating tablet 8 mg (8 mg Oral Given 06/10/11 1645)    New Prescriptions   OXYCODONE-ACETAMINOPHEN (PERCOCET) 5-325 MG PER TABLET    1 or 2 tabs PO q6h prn pain     Syaire Saber Allison Quarry, DO 06/11/11 [phone]

## 2011-06-10 NOTE — ED Notes (Signed)
Pt c/o headache since yesterday.

## 2011-06-10 NOTE — ED Notes (Signed)
Pt states has history of migraine headaches, generally takes maxalt , tobramax and percocet to relieve head pain.  Pt denies having any of these medications at this time as she has no insurance and cannot afford to pay MD. Pt states pain started yesterday at lunchtime and has been unsuccessful relieving the pain. Pt has N/v at this time.

## 2011-06-10 NOTE — ED Notes (Signed)
Pt states has been in jail the past week, (nonsupport of child) and believes her headache is caused from "lack of  Needs being met".

## 2011-06-19 ENCOUNTER — Encounter (HOSPITAL_COMMUNITY): Payer: Self-pay | Admitting: *Deleted

## 2011-06-19 ENCOUNTER — Emergency Department (HOSPITAL_COMMUNITY)
Admission: EM | Admit: 2011-06-19 | Discharge: 2011-06-19 | Disposition: A | Discharge status: Home / Self Care | Payer: Self-pay | Attending: Emergency Medicine | Admitting: Emergency Medicine

## 2011-06-19 DIAGNOSIS — R110 Nausea: Secondary | ICD-10-CM | POA: Insufficient documentation

## 2011-06-19 DIAGNOSIS — F17200 Nicotine dependence, unspecified, uncomplicated: Secondary | ICD-10-CM | POA: Insufficient documentation

## 2011-06-19 DIAGNOSIS — G43909 Migraine, unspecified, not intractable, without status migrainosus: Secondary | ICD-10-CM | POA: Insufficient documentation

## 2011-06-19 DIAGNOSIS — Z79899 Other long term (current) drug therapy: Secondary | ICD-10-CM | POA: Insufficient documentation

## 2011-06-19 DIAGNOSIS — H53149 Visual discomfort, unspecified: Secondary | ICD-10-CM | POA: Insufficient documentation

## 2011-06-19 MED ORDER — ONDANSETRON 4 MG PO TBDP
8.0000 mg | ORAL_TABLET | Freq: Once | ORAL | Status: DC
  Filled 2011-06-19: qty 2

## 2011-06-19 MED ORDER — OXYCODONE-ACETAMINOPHEN 5-325 MG PO TABS
2.0000 | ORAL_TABLET | Freq: Once | ORAL | Status: DC
  Filled 2011-06-19: qty 2

## 2011-06-19 MED ORDER — KETOROLAC TROMETHAMINE 30 MG/ML IJ SOLN: 30.0000 mg | Freq: Once | INTRAMUSCULAR | Status: DC

## 2011-06-19 MED ORDER — OXYCODONE-ACETAMINOPHEN 5-325 MG PO TABS: 1.0000 | ORAL_TABLET | ORAL | Status: DC | PRN

## 2011-06-19 MED ORDER — KETOROLAC TROMETHAMINE 60 MG/2ML IM SOLN
30.0000 mg | Freq: Once | INTRAMUSCULAR | Status: DC
  Filled 2011-06-19: qty 4

## 2011-06-19 NOTE — ED Notes (Signed)
Pt not in room.

## 2011-06-19 NOTE — ED Provider Notes (Signed)
History     CSN: [id number]  Arrival date & time 06/19/11  1411   First MD Initiated Contact with Patient 06/19/11 1501      Chief Complaint  Patient presents with  . Headache    (Consider location/radiation/quality/duration/timing/severity/associated sxs/prior treatment) HPI Comments: Patient has history of migraines.  Reports that her headache today is no different than migraines that she has had in the past.  She states that stress is a common trigger for her migraines.  Her husband had a MI last evening and is currently in the hospital, which is causing her more stress.  She reports that she typically takes percocet for her migraines, which helps but she does not have any percocet with her at the hospital.  She also reports that when she comes to the ED toradol and percocet are typically given to her, which help her symptoms.  Patient is a 40 y.o. female presenting with migraine. The history is provided by the patient.  Migraine This is a new problem. The current episode started yesterday. The problem occurs constantly. The problem has been unchanged. Associated symptoms include headaches and nausea. Pertinent negatives include no abdominal pain, chest pain, chills, coughing, diaphoresis, fever, neck pain, numbness, rash, sore throat, vertigo, visual change, vomiting or weakness. The symptoms are aggravated by stress. She has tried acetaminophen for the symptoms. The treatment provided mild relief.    Past Medical History  Diagnosis Date  . Migraine   . Seizures     Past Surgical History  Procedure Date  . Knee surgery   . Cesarean section     History reviewed. No pertinent family history.  History  Substance Use Topics  . Smoking status: Current Everyday Smoker -- 0.5 packs/day    Types: Cigarettes  . Smokeless tobacco: Not on file  . Alcohol Use: No    OB History    Grav Para Term Preterm Abortions TAB SAB Ect Mult Living                  Review of Systems    Constitutional: Negative for fever, chills, diaphoresis and activity change.  HENT: Negative for ear pain, sore throat, neck pain and neck stiffness.   Eyes: Positive for photophobia. Negative for pain, redness and visual disturbance.  Respiratory: Negative for cough, chest tightness and shortness of breath.   Cardiovascular: Negative for chest pain.  Gastrointestinal: Positive for nausea. Negative for vomiting and abdominal pain.  Musculoskeletal: Negative for gait problem.  Skin: Negative for rash.  Neurological: Positive for headaches. Negative for dizziness, vertigo, syncope, weakness, light-headedness and numbness.  Psychiatric/Behavioral: Negative for confusion.    Allergies  Haldol; Penicillins; Darvocet; and Imitrex  Home Medications   Current Outpatient Rx  Name Route Sig Dispense Refill  . ACETAMINOPHEN 500 MG PO TABS Oral Take 500 mg by mouth as needed. For pain     . ALPRAZOLAM 1 MG PO TABS Oral Take 1 mg by mouth 3 (three) times daily as needed. For anxiety    . DIVALPROEX SODIUM 500 MG PO TBEC Oral Take 500 mg by mouth 2 (two) times daily.      Marland Kitchen GABAPENTIN 300 MG PO CAPS Oral Take 300 mg by mouth 3 (three) times daily.      Marland Kitchen LAMOTRIGINE 25 MG PO TABS Oral Take 25 mg by mouth daily.      Marland Kitchen LORAZEPAM 1 MG PO TABS Oral Take 1 mg by mouth every 8 (eight) hours as needed. For anxiety    .  MULTIVITAMIN PO Oral Take 1 tablet by mouth daily.      . OXYCODONE-ACETAMINOPHEN 10-325 MG PO TABS Oral Take 1 tablet by mouth every 4 (four) hours as needed. Pain     . RISPERIDONE 3 MG PO TABS Oral Take 3 mg by mouth at bedtime.      . SERTRALINE HCL 50 MG PO TABS Oral Take 50 mg by mouth every morning.     . TRAZODONE HCL 150 MG PO TABS Oral Take 150 mg by mouth at bedtime.        BP 122/78  Pulse 74  Temp(Src) 98.3 F (36.8 C) (Oral)  SpO2 98%  LMP 03/22/2011  Physical Exam  Nursing note and vitals reviewed. Constitutional: She is oriented to person, place, and time. She  appears well-developed and well-nourished. No distress.  HENT:  Head: Normocephalic and atraumatic.  Eyes: EOM are normal. Pupils are equal, round, and reactive to light.  Neck: Normal range of motion. Neck supple.  Cardiovascular: Normal rate, regular rhythm and normal heart sounds.   No murmur heard. Pulmonary/Chest: Effort normal and breath sounds normal. No respiratory distress. She has no wheezes.  Neurological: She is alert and oriented to person, place, and time. She has normal strength and normal reflexes. She displays normal reflexes. No cranial nerve deficit or sensory deficit. Coordination and gait normal.  Skin: Skin is warm and dry. No rash noted. She is not diaphoretic.  Psychiatric: She has a normal mood and affect.    ED Course  Procedures (including critical care time)  Labs Reviewed - No data to display No results found.   No diagnosis found.  Patient left the ED AMA without notifying ED staff prior to getting her pain medication.  No   MDM  Headache similar to migraines that she has had in the past.  She reports that this headache is no different.  Normal neurological exam.  Therefore, no CT head was ordered.  Patient left the ED AMA prior to getting the pain medication that was ordered.        Pascal Lux [facility] 06/21/11 1243

## 2011-06-19 NOTE — ED Notes (Signed)
To ed for eval of migraine since last night. Pt states she has been in hospital with husband who was a STEMI last night.

## 2011-06-20 ENCOUNTER — Encounter (HOSPITAL_COMMUNITY): Payer: Self-pay | Admitting: *Deleted

## 2011-06-20 ENCOUNTER — Emergency Department (HOSPITAL_COMMUNITY)
Admission: EM | Admit: 2011-06-20 | Discharge: 2011-06-20 | Disposition: A | Discharge status: Home / Self Care | Payer: Self-pay | Attending: Emergency Medicine | Admitting: Emergency Medicine

## 2011-06-20 DIAGNOSIS — G43909 Migraine, unspecified, not intractable, without status migrainosus: Secondary | ICD-10-CM | POA: Insufficient documentation

## 2011-06-20 DIAGNOSIS — Z79899 Other long term (current) drug therapy: Secondary | ICD-10-CM | POA: Insufficient documentation

## 2011-06-20 DIAGNOSIS — F17200 Nicotine dependence, unspecified, uncomplicated: Secondary | ICD-10-CM | POA: Insufficient documentation

## 2011-06-20 DIAGNOSIS — F329 Major depressive disorder, single episode, unspecified: Secondary | ICD-10-CM | POA: Insufficient documentation

## 2011-06-20 DIAGNOSIS — F3289 Other specified depressive episodes: Secondary | ICD-10-CM | POA: Insufficient documentation

## 2011-06-20 DIAGNOSIS — R110 Nausea: Secondary | ICD-10-CM | POA: Insufficient documentation

## 2011-06-20 HISTORY — DX: Major depressive disorder, single episode, unspecified: F32.9

## 2011-06-20 HISTORY — DX: Depression, unspecified: F32.A

## 2011-06-20 MED ORDER — ACETAMINOPHEN 500 MG PO TABS
1000.0000 mg | ORAL_TABLET | ORAL | Status: DC
  Filled 2011-06-20: qty 2

## 2011-06-20 MED ORDER — OXYCODONE-ACETAMINOPHEN 5-325 MG PO TABS: ORAL_TABLET | ORAL | Status: AC

## 2011-06-20 MED ORDER — ONDANSETRON 4 MG PO TBDP
8.0000 mg | ORAL_TABLET | Freq: Once | ORAL | Status: AC
  Administered 2011-06-20: 8 mg via ORAL
  Filled 2011-06-20 (×2): qty 1

## 2011-06-20 MED ORDER — KETOROLAC TROMETHAMINE 60 MG/2ML IM SOLN
60.0000 mg | Freq: Once | INTRAMUSCULAR | Status: DC
  Filled 2011-06-20: qty 2

## 2011-06-20 MED ORDER — SODIUM CHLORIDE 0.9 % IV BOLUS (SEPSIS): 1000.0000 mL | Freq: Once | INTRAVENOUS | Status: DC

## 2011-06-20 MED ORDER — ACETAMINOPHEN 325 MG PO TABS
975.0000 mg | ORAL_TABLET | Freq: Once | ORAL | Status: AC
  Administered 2011-06-20: 975 mg via ORAL
  Filled 2011-06-20: qty 3

## 2011-06-20 NOTE — ED Notes (Signed)
Pt with R sided headache, photophobia and nausea.  She has had the headache for 3 days and is here with her husband who is on 2500.  She does not want toradol, just zofran and acetaminophen.

## 2011-06-20 NOTE — ED Notes (Signed)
Pt with hx of migranes to ED c/o headache. Pt states it's her typical headache with throbbing to R side.

## 2011-06-20 NOTE — ED Provider Notes (Signed)
History     CSN: [id number]  Arrival date & time 06/20/11  4098   Chief Complaint  Patient presents with  . Migraine     HPI Pt was seen at 0355.  Per pt, c/o gradual onset and persistence of constant acute flair of her chronic migraine headache for the past 3 days.  Describes the headache as per her usual chronic migraine headache pain pattern for many years.  States she has been "under a lot of stress" with her husband in the hospital, which triggers her migraine headaches.  Pt states she has run out of percocet which usually relieves her headaches.  Pt was eval in the ED earlier this evening, but left before being given her meds.  Denies headache was sudden or maximal in onset or at any time.  Denies visual changes, no focal motor weakness, no tingling/numbness in extremities, no fevers, no neck pain, no rash.     Neuro:  Dr. Gerilyn Pilgrim PMD:  Dr. Janna Arch Past Medical History  Diagnosis Date  . Migraine   . Seizures   . 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 Depression     Past Surgical History  Procedure Date  . Knee surgery   . Cesarean section      History  Substance Use Topics  . Smoking status: Current Everyday Smoker -- 0.5 packs/day    Types: Cigarettes  . Smokeless tobacco: Not on file  . Alcohol Use: No   Review of Systems ROS: Statement: All systems negative except as marked or noted in the HPI; Constitutional: Negative for fever and chills. ; ; Eyes: Negative for eye pain, redness and discharge. ; ; ENMT: Negative for ear pain, hoarseness, nasal congestion, sinus pressure and sore throat. ; ; Cardiovascular: Negative for chest pain, palpitations, diaphoresis, dyspnea and peripheral edema. ; ; Respiratory: Negative for cough, wheezing and stridor. ; ; Gastrointestinal: +nausea. Negative for vomiting, diarrhea, abdominal pain, blood in stool, hematemesis, jaundice and rectal bleeding. . ; ; Genitourinary: Negative for dysuria, flank pain and hematuria. ; ; Musculoskeletal:  Negative for back pain and neck pain. Negative for swelling and trauma.; ; Skin: Negative for pruritus, rash, abrasions, blisters, bruising and skin lesion.; ; Neuro: +headache.  Negative for lightheadedness and neck stiffness. Negative for weakness, altered level of consciousness , altered mental status, extremity weakness, paresthesias, involuntary movement, seizure and syncope.     Allergies  Haldol; Penicillins; Darvocet; and Imitrex  Home Medications   Current Outpatient Rx  Name Route Sig Dispense Refill  . ACETAMINOPHEN 500 MG PO TABS Oral Take 500 mg by mouth as needed. For pain     . ALPRAZOLAM 1 MG PO TABS Oral Take 1 mg by mouth 3 (three) times daily as needed. For anxiety    . DIVALPROEX SODIUM 500 MG PO TBEC Oral Take 500 mg by mouth 2 (two) times daily.      Marland Kitchen GABAPENTIN 300 MG PO CAPS Oral Take 300 mg by mouth 3 (three) times daily.      Marland Kitchen LAMOTRIGINE 25 MG PO TABS Oral Take 25 mg by mouth daily.      Marland Kitchen LORAZEPAM 1 MG PO TABS Oral Take 1 mg by mouth every 8 (eight) hours as needed. For anxiety    . MULTIVITAMIN PO Oral Take 1 tablet by mouth daily.      . OXYCODONE-ACETAMINOPHEN 10-325 MG PO TABS Oral Take 1 tablet by mouth every 4 (four) hours as needed. Pain     .  OXYCODONE-ACETAMINOPHEN 5-325 MG PO TABS  1 or 2 tabs PO q6h prn pain 20 tablet 0  . RISPERIDONE 3 MG PO TABS Oral Take 3 mg by mouth at bedtime.      . SERTRALINE HCL 50 MG PO TABS Oral Take 50 mg by mouth every morning.     . TRAZODONE HCL 150 MG PO TABS Oral Take 150 mg by mouth at bedtime.         BP 109/70  Pulse 77  Temp(Src) 97.4 F (36.3 C) (Oral)  Resp 18  SpO2 95%  LMP 03/22/2011  Physical Exam Physical examination:  Nursing notes reviewed; Vital signs and O2 SAT reviewed;  Constitutional: Well developed, Well nourished, Well hydrated, In no acute distress; Head:  Normocephalic, atraumatic; Eyes: EOMI, PERRL, No scleral icterus; ENMT: Mouth and pharynx normal, Mucous membranes moist; Neck:  Supple, Full range of motion, No lymphadenopathy; Cardiovascular: Regular rate and rhythm, No murmur, rub, or gallop; Respiratory: Breath sounds clear & equal bilaterally, No rales, rhonchi, wheezes, or rub, Normal respiratory effort/excursion; Chest: Nontender, Movement normal; Extremities: Pulses normal, No tenderness, No edema, No calf edema or asymmetry.; Neuro: AA&Ox3, Major CN grossly intact. Speech clear, no facial droop. No gross focal motor or sensory deficits in extremities.; Skin: Color normal, Warm, Dry, no rash.   ED Course  Procedures   MDM  MDM Reviewed: nursing note, previous chart and vitals    4:18 AM:  Typical chronic migraine headache.  Freq ED evals for same.  Informed she will not be receiving narcotics in the ED, as she will be going upstairs/walking around the hospital.  Verb understanding.  Refusing the toradol, wants just tylenol and zofran.  States she has not been eating or drinking well due to being worried about her husband who is sick upstairs.  Does not want IVF though will accept PO fluids/food.     Danny Yackley Allison Quarry, DO 06/21/11 [id number]

## 2011-06-20 NOTE — ED Notes (Signed)
Pt tolerated coke. Given discharge paper.

## 2011-06-27 ENCOUNTER — Emergency Department (HOSPITAL_COMMUNITY): Payer: Self-pay

## 2011-06-27 ENCOUNTER — Emergency Department (HOSPITAL_COMMUNITY)
Admission: EM | Admit: 2011-06-27 | Discharge: 2011-06-27 | Disposition: A | Discharge status: Home / Self Care | Payer: Self-pay | Attending: Emergency Medicine | Admitting: Emergency Medicine

## 2011-06-27 ENCOUNTER — Other Ambulatory Visit: Payer: Self-pay

## 2011-06-27 ENCOUNTER — Encounter (HOSPITAL_COMMUNITY): Payer: Self-pay | Admitting: *Deleted

## 2011-06-27 DIAGNOSIS — G43909 Migraine, unspecified, not intractable, without status migrainosus: Secondary | ICD-10-CM | POA: Insufficient documentation

## 2011-06-27 DIAGNOSIS — F3289 Other specified depressive episodes: Secondary | ICD-10-CM | POA: Insufficient documentation

## 2011-06-27 DIAGNOSIS — R569 Unspecified convulsions: Secondary | ICD-10-CM | POA: Insufficient documentation

## 2011-06-27 DIAGNOSIS — R079 Chest pain, unspecified: Secondary | ICD-10-CM | POA: Insufficient documentation

## 2011-06-27 DIAGNOSIS — F329 Major depressive disorder, single episode, unspecified: Secondary | ICD-10-CM | POA: Insufficient documentation

## 2011-06-27 DIAGNOSIS — F17200 Nicotine dependence, unspecified, uncomplicated: Secondary | ICD-10-CM | POA: Insufficient documentation

## 2011-06-27 HISTORY — DX: Cardiac murmur, unspecified: R01.1

## 2011-06-27 LAB — CBC
HCT: 40.7 % (ref 36.0–46.0)
Hemoglobin: 14.2 g/dL (ref 12.0–15.0)
MCHC: 34.9 g/dL (ref 30.0–36.0)
RBC: 4.66 MIL/uL (ref 3.87–5.11)

## 2011-06-27 LAB — COMPREHENSIVE METABOLIC PANEL
ALT: 7 U/L (ref 0–35)
AST: 12 U/L (ref 0–37)
Calcium: 9.4 mg/dL (ref 8.4–10.5)
GFR calc Af Amer: >90 mL/min (ref >90)
Glucose, Bld: 97 mg/dL (ref 70–99)
Sodium: 139 mEq/L (ref 135–145)
Total Protein: 6.7 g/dL (ref 6.0–8.3)

## 2011-06-27 LAB — DIFFERENTIAL
Basophils Relative: 0 % (ref 0–1)
Lymphs Abs: 3.1 10*3/uL (ref 0.7–4.0)
Monocytes Absolute: 0.8 10*3/uL (ref 0.1–1.0)
Monocytes Relative: 6 % (ref 3–12)
Neutro Abs: 8.3 10*3/uL — ABNORMAL HIGH (ref 1.7–7.7)

## 2011-06-27 LAB — TROPONIN I: Troponin I: <0.3 ng/mL (ref <0.30)

## 2011-06-27 MED ORDER — KETOROLAC TROMETHAMINE 30 MG/ML IJ SOLN: 30.0000 mg | Freq: Once | INTRAMUSCULAR | Status: DC

## 2011-06-27 MED ORDER — MORPHINE SULFATE 4 MG/ML IJ SOLN
4.0000 mg | Freq: Once | INTRAMUSCULAR | Status: AC
  Administered 2011-06-27: 4 mg via INTRAVENOUS
  Filled 2011-06-27: qty 1

## 2011-06-27 NOTE — ED Notes (Signed)
Pt stating pain not controlled. MD at bedside. Offered Pain medication NSAIDs or Tylenol for pt. Informed and educated pt about negative test results so far; also informed pt that further blood test and EKG would be done at 4am. Pt refused other pain medication. MD informed pt that narcotic medication would not be given. Pt stated "unhook me then, I will go home and take my own percocet, or buy my pain meds off the street" Pt informed that she would be leaving against medical advice by MD and by this RN prior to leaving. AMA form signed. Pt ambulatory with steady gait at time pt left department

## 2011-06-27 NOTE — ED Notes (Signed)
Pt requesting pain medication Pain at 8. MD notified. Orders received.

## 2011-06-27 NOTE — ED Notes (Signed)
Pt states allergy to Toradol.  Will update Profile

## 2011-06-27 NOTE — ED Provider Notes (Signed)
History     CSN: [id number]  Arrival date & time 06/27/11  [name]   First MD Initiated Contact with Patient 06/27/11 0026      Chief Complaint  Patient presents with  . Chest Pain    (Consider location/radiation/quality/duration/timing/severity/associated sxs/prior treatment) HPI Comments: 41 year old female with history of seizures and tobacco abuse and multiple psychiatric problems including depression presents with left-sided chest pain. This is described as a pressure, constant, radiation to the left upper arm, not associated with shortness of breath cough fever nausea vomiting diarrhea abdominal pain or swelling of the legs. The symptoms were acute in onset approximately 2 hours ago while she was getting ready for bed. She admits to having a stress test 5 years ago which was normal. She denies any chest pain with exertion, deep breathing, position or eating. She states that she took aspirin prior to arrival and had nitroglycerin that belonged to her husband which may have helped a small amount.  Patient is a 41 y.o. female presenting with chest pain. The history is provided by the patient, medical records and the EMS personnel.  Chest Pain     Past Medical History  Diagnosis Date  . Migraine   . Seizures   . 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 Depression   . Heart murmur     Past Surgical History  Procedure Date  . Knee surgery   . Cesarean section     Family History  Problem Relation Age of Onset  . Heart failure Mother   . Diabetes Mother   . Hyperlipidemia Mother   . Hypertension Mother   . Stroke Sister   . Seizures Sister     History  Substance Use Topics  . Smoking status: Current Everyday Smoker -- 0.5 packs/day    Types: Cigarettes  . Smokeless tobacco: Not on file  . Alcohol Use: No    OB History    Grav Para Term Preterm Abortions TAB SAB Ect Mult Living   8 5 5  3            Review of Systems  Cardiovascular: Positive for chest pain.  All other  systems reviewed and are negative.    Allergies  Haldol; Penicillins; Darvocet; Imitrex; and Toradol  Home Medications   Current Outpatient Rx  Name Route Sig Dispense Refill  . ACETAMINOPHEN 500 MG PO TABS Oral Take 500 mg by mouth as needed. For pain     . ALPRAZOLAM 1 MG PO TABS Oral Take 1 mg by mouth 3 (three) times daily as needed. For anxiety    . DIVALPROEX SODIUM 500 MG PO TBEC Oral Take 500 mg by mouth 2 (two) times daily.      Marland Kitchen GABAPENTIN 300 MG PO CAPS Oral Take 300 mg by mouth 3 (three) times daily.      Marland Kitchen LAMOTRIGINE 25 MG PO TABS Oral Take 25 mg by mouth daily.      Marland Kitchen LORAZEPAM 1 MG PO TABS Oral Take 1 mg by mouth every 8 (eight) hours as needed. For anxiety    . MULTIVITAMIN PO Oral Take 1 tablet by mouth daily.      . OXYCODONE-ACETAMINOPHEN 10-325 MG PO TABS Oral Take 1 tablet by mouth every 4 (four) hours as needed. Pain     . OXYCODONE-ACETAMINOPHEN 5-325 MG PO TABS  1 or 2 tabs PO q6h prn pain 20 tablet 0  . RISPERIDONE 3 MG PO TABS Oral Take 3 mg by mouth  at bedtime.      . SERTRALINE HCL 50 MG PO TABS Oral Take 50 mg by mouth every morning.     . TRAZODONE HCL 150 MG PO TABS Oral Take 150 mg by mouth at bedtime.        BP 107/70  Pulse 96  Temp(Src) 97.1 F (36.2 C) (Oral)  Resp 18  Ht 5\' 3"  (1.6 m)  Wt 150 lb (68.04 kg)  BMI 26.57 kg/m2  SpO2 98%  LMP 03/22/2011  Physical Exam  Nursing note and vitals reviewed. Constitutional: She appears well-developed and well-nourished. No distress.  HENT:  Head: Normocephalic and atraumatic.  Mouth/Throat: Oropharynx is clear and moist. No oropharyngeal exudate.  Eyes: Conjunctivae and EOM are normal. Pupils are equal, round, and reactive to light. Right eye exhibits no discharge. Left eye exhibits no discharge. No scleral icterus.  Neck: Normal range of motion. Neck supple. No JVD present. No thyromegaly present.  Cardiovascular: Normal rate, regular rhythm, normal heart sounds and intact distal pulses.  Exam  reveals no gallop and no friction rub.   No murmur heard. Pulmonary/Chest: Effort normal and breath sounds normal. No respiratory distress. She has no wheezes. She has no rales. She exhibits no tenderness.  Abdominal: Soft. Bowel sounds are normal. She exhibits no distension and no mass. There is no tenderness.  Musculoskeletal: Normal range of motion. She exhibits no edema and no tenderness.  Lymphadenopathy:    She has no cervical adenopathy.  Neurological: She is alert. Coordination normal.  Skin: Skin is warm and dry. No rash noted. No erythema.  Psychiatric: She has a normal mood and affect. Her behavior is normal.    ED Course  Procedures (including critical care time)  Labs Reviewed  COMPREHENSIVE METABOLIC PANEL - Abnormal; Notable for the following:    Potassium 3.2 (*)    Total Bilirubin 0.2 (*)    All other components within normal limits  CBC - Abnormal; Notable for the following:    WBC 12.3 (*)    All other components within normal limits  DIFFERENTIAL - Abnormal; Notable for the following:    Neutro Abs 8.3 (*)    All other components within normal limits  TROPONIN I   Dg Chest Portable 1 View  06/27/2011  *RADIOLOGY REPORT*  Clinical Data: Chest pain and shortness of breath  PORTABLE CHEST - 1 VIEW  Comparison: 08/16/2010  Findings: Slightly shallow inspiration. The heart size and pulmonary vascularity are normal. The lungs appear clear and expanded without focal air space disease or consolidation. No blunting of the costophrenic angles.  No significant change since previous study.  IMPRESSION: No evidence of active pulmonary disease.  Original Report Authenticated By: Marlon Pel, M.D.     1. Chest pain       MDM  Chest pain, atypical for coronary syndrome, EKG shows no acute ischemia and no significant changes from last EKG. Labs, chest x-ray, reevaluate  ED ECG REPORT   Date: 06/27/2011   Rate: 87  Rhythm: normal sinus rhythm  QRS Axis: normal   Intervals: normal  ST/T Wave abnormalities: nonspecific T wave changes  Conduction Disutrbances:none  Narrative Interpretation:   Old EKG Reviewed: no significant changes from 04/12/2010    I have discussed the labs and x-ray findings with the patient, slightly elevated blood counts and normal chest x-ray.vital signs are stable including blood pressure, pulse, temperature, respirations.  She has refused further evaluation. She has been given morphine for her chest pain, states that  she wants to go home and does not want any further testing. She appears to be able to make her own decisions at this  Vida Roller, MD 06/27/11 [phone]

## 2011-06-27 NOTE — ED Notes (Signed)
Patient states that her chest suddenly started hurting bad, states it felt tight. Patient states she took 3 nitroglycerin's and an 81 mg asa. Denies n,v. Complaining of slight shortness of breath.

## 2011-06-27 NOTE — Discharge Instructions (Signed)
You may return to the hospital at any time should he choose to have further evaluation. I have recommended further testing in the emergency department that you are refusing this.

## 2011-06-30 ENCOUNTER — Emergency Department (HOSPITAL_COMMUNITY)
Admission: EM | Admit: 2011-06-30 | Discharge: 2011-06-30 | Disposition: A | Discharge status: Home / Self Care | Payer: Self-pay | Attending: Emergency Medicine | Admitting: Emergency Medicine

## 2011-06-30 ENCOUNTER — Encounter (HOSPITAL_COMMUNITY): Payer: Self-pay | Admitting: *Deleted

## 2011-06-30 DIAGNOSIS — S39012A Strain of muscle, fascia and tendon of lower back, initial encounter: Secondary | ICD-10-CM

## 2011-06-30 DIAGNOSIS — M545 Low back pain, unspecified: Secondary | ICD-10-CM | POA: Insufficient documentation

## 2011-06-30 DIAGNOSIS — IMO0001 Reserved for inherently not codable concepts without codable children: Secondary | ICD-10-CM | POA: Insufficient documentation

## 2011-06-30 DIAGNOSIS — F329 Major depressive disorder, single episode, unspecified: Secondary | ICD-10-CM | POA: Insufficient documentation

## 2011-06-30 DIAGNOSIS — M79609 Pain in unspecified limb: Secondary | ICD-10-CM | POA: Insufficient documentation

## 2011-06-30 DIAGNOSIS — F3289 Other specified depressive episodes: Secondary | ICD-10-CM | POA: Insufficient documentation

## 2011-06-30 DIAGNOSIS — F17200 Nicotine dependence, unspecified, uncomplicated: Secondary | ICD-10-CM | POA: Insufficient documentation

## 2011-06-30 DIAGNOSIS — S335XXA Sprain of ligaments of lumbar spine, initial encounter: Secondary | ICD-10-CM | POA: Insufficient documentation

## 2011-06-30 DIAGNOSIS — Z79899 Other long term (current) drug therapy: Secondary | ICD-10-CM | POA: Insufficient documentation

## 2011-06-30 DIAGNOSIS — X500XXA Overexertion from strenuous movement or load, initial encounter: Secondary | ICD-10-CM | POA: Insufficient documentation

## 2011-06-30 MED ORDER — HYDROMORPHONE HCL PF 2 MG/ML IJ SOLN
2.0000 mg | Freq: Once | INTRAMUSCULAR | Status: AC
  Administered 2011-06-30: 2 mg via INTRAMUSCULAR
  Filled 2011-06-30: qty 1

## 2011-06-30 NOTE — ED Notes (Signed)
Pt states she "pulled something in her back after lifting a washing machine yesterday morning". Hx of chronic back pain.

## 2011-06-30 NOTE — Discharge Instructions (Signed)
Continue take Tylenol and Flexeril for the back. Pain medicine given in the ED will help today. Followup with your regular doctor in the next day or 2 for additional pain medicine as needed.

## 2011-06-30 NOTE — ED Notes (Signed)
Pt c/o lower back pain radiating to bilateral buttocks and thighs since lifting a washing machine yesterday. Pt alert and oriented x 3. Skin warm and dry. Color pink.

## 2011-06-30 NOTE — ED Provider Notes (Addendum)
Scribed for Shelda Jakes, MD, the patient was seen in room APA16A/APA16A . This chart was scribed by Ellie Lunch.   CSN: [id number]  Arrival date & time 06/30/11  4098   First MD Initiated Contact with Patient 06/30/11 [phone]      Chief Complaint  Patient presents with  . Back Pain    (Consider location/radiation/quality/duration/timing/severity/associated sxs/prior treatment) The history is provided by the patient. No language interpreter was used.   Pt seen at 8:11 AM [name] is a 41 y.o. female who presents to the Emergency Department complaining of 1 day of central lower back pain. Pain began after PT lifted an "old timey washing machine." Pain radiates to buttocks and bilateral thighs. Pt has h/o back pain. Denies any urinary or bowel incontinence. Pt treated pain with tylenol and flexoril with some mild improvement.  ROS-PT denies Ha, CP, SOB, ab pain, n/v/d, rash, dysuria, neck pain, pedal edema.   Past Medical History  Diagnosis Date  . Migraine   . Seizures   . 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 Depression   . Heart murmur     Past Surgical History  Procedure Date  . Knee surgery   . Cesarean section     Family History  Problem Relation Age of Onset  . Heart failure Mother   . Diabetes Mother   . Hyperlipidemia Mother   . Hypertension Mother   . Stroke Sister   . Seizures Sister     History  Substance Use Topics  . Smoking status: Current Everyday Smoker -- 0.5 packs/day    Types: Cigarettes  . Smokeless tobacco: Not on file  . Alcohol Use: No    OB History    Grav Para Term Preterm Abortions TAB SAB Ect Mult Living   8 5 5  3            Review of Systems  Constitutional: Negative for chills.       10 Systems reviewed and are negative for acute change except as noted in the HPI.  HENT: Negative for sore throat and neck pain.   Eyes: Negative for discharge and redness.  Respiratory: Negative for cough and shortness of breath.     Cardiovascular: Negative for leg swelling.  Gastrointestinal: Negative for nausea, vomiting and diarrhea.  Skin: Negative for rash.  Neurological: Negative for syncope.  Psychiatric/Behavioral: Negative for hallucinations and confusion.  All other systems reviewed and are negative.    Allergies  Haldol; Penicillins; Darvocet; Imitrex; and Toradol  Home Medications   Current Outpatient Rx  Name Route Sig Dispense Refill  . ACETAMINOPHEN 500 MG PO TABS Oral Take 500 mg by mouth as needed. For pain     . ALPRAZOLAM 1 MG PO TABS Oral Take 1 mg by mouth 3 (three) times daily as needed. For anxiety    . DIVALPROEX SODIUM 500 MG PO TBEC Oral Take 500 mg by mouth 2 (two) times daily.      Marland Kitchen GABAPENTIN 300 MG PO CAPS Oral Take 300 mg by mouth 3 (three) times daily.      Marland Kitchen LAMOTRIGINE 25 MG PO TABS Oral Take 25 mg by mouth daily.      Marland Kitchen LORAZEPAM 1 MG PO TABS Oral Take 1 mg by mouth every 8 (eight) hours as needed. For anxiety    . MULTIVITAMIN PO Oral Take 1 tablet by mouth daily.      . OXYCODONE-ACETAMINOPHEN 10-325 MG PO TABS Oral Take 1  tablet by mouth every 4 (four) hours as needed. Pain     . OXYCODONE-ACETAMINOPHEN 5-325 MG PO TABS  1 or 2 tabs PO q6h prn pain 20 tablet 0  . RISPERIDONE 3 MG PO TABS Oral Take 3 mg by mouth at bedtime.      . SERTRALINE HCL 50 MG PO TABS Oral Take 50 mg by mouth every morning.     . TRAZODONE HCL 150 MG PO TABS Oral Take 150 mg by mouth at bedtime.        BP 115/77  Pulse 79  Temp(Src) 98.4 F (36.9 C) (Oral)  Resp 16  Ht 5\' 3"  (1.6 m)  Wt 150 lb (68.04 kg)  BMI 26.57 kg/m2  SpO2 100%  LMP 06/09/2011  Physical Exam  Nursing note and vitals reviewed. Constitutional: She is oriented to person, place, and time. She appears well-developed and well-nourished.  HENT:  Head: Normocephalic and atraumatic.  Neck: Normal range of motion. Neck supple.  Cardiovascular: Normal rate, regular rhythm and normal heart sounds.   No murmur  heard. Pulmonary/Chest: Effort normal and breath sounds normal. No respiratory distress.  Abdominal: Soft. Bowel sounds are normal.  Musculoskeletal: Normal range of motion.       No spinal tenderness to palpation  Neurological: She is alert and oriented to person, place, and time. Coordination normal.       Strength normal  Skin: Skin is warm and dry.    ED Course  Procedures (including critical care time) DIAGNOSTIC STUDIES: Oxygen Saturation is 100% on room air, normal by my interpretation.    COORDINATION OF CARE:  ED MEDICATION Medications  HYDROmorphone (DILAUDID) injection 2 mg    8:49 AM Pt recheck. Pt reports feeling improved after receiving dilaudid and is ready for discharge.   1. Lumbar strain     MDM  Patient with history of back problems in the past acute onset of back pain starting yesterday after moving a washing machine. Patient has been seen multiple times in the emergency department since November for pain related complaints is followed locally by her primary care Dr. In the emergency Department given IM narcotic pain medicine told patient that do too frequent visits to the ED for prescriptions for pain medicine most recent one was on the first of January her she was given a prescription for Percocet that we will not be able to give additional prescription for oral pain meds.  Patient states that the IM medicine she got in the ED is helping her significantly and she feels better.    I personally performed the services described in this documentation, which was scribed in my presence. The recorded information has been reviewed and considered.       Shelda Jakes, MD 06/30/11 [phone]  Shelda Jakes, MD 06/30/11 [phone]

## 2011-07-06 ENCOUNTER — Emergency Department (HOSPITAL_COMMUNITY)
Admission: EM | Admit: 2011-07-06 | Discharge: 2011-07-06 | Disposition: A | Discharge status: Home / Self Care | Payer: Self-pay | Attending: Emergency Medicine | Admitting: Emergency Medicine

## 2011-07-06 ENCOUNTER — Encounter (HOSPITAL_COMMUNITY): Payer: Self-pay | Admitting: Emergency Medicine

## 2011-07-06 DIAGNOSIS — R110 Nausea: Secondary | ICD-10-CM | POA: Insufficient documentation

## 2011-07-06 DIAGNOSIS — Z7982 Long term (current) use of aspirin: Secondary | ICD-10-CM | POA: Insufficient documentation

## 2011-07-06 DIAGNOSIS — G40909 Epilepsy, unspecified, not intractable, without status epilepticus: Secondary | ICD-10-CM | POA: Insufficient documentation

## 2011-07-06 DIAGNOSIS — R51 Headache: Secondary | ICD-10-CM | POA: Insufficient documentation

## 2011-07-06 MED ORDER — OXYCODONE-ACETAMINOPHEN 5-325 MG PO TABS
1.0000 | ORAL_TABLET | Freq: Once | ORAL | Status: AC
  Administered 2011-07-06: 1 via ORAL
  Filled 2011-07-06: qty 1

## 2011-07-06 MED ORDER — METOCLOPRAMIDE HCL 5 MG/ML IJ SOLN
10.0000 mg | Freq: Once | INTRAMUSCULAR | Status: AC
  Administered 2011-07-06: 10 mg via INTRAMUSCULAR
  Filled 2011-07-06: qty 2

## 2011-07-06 MED ORDER — DIPHENHYDRAMINE HCL 50 MG/ML IJ SOLN
50.0000 mg | Freq: Once | INTRAMUSCULAR | Status: AC
  Administered 2011-07-06: 50 mg via INTRAMUSCULAR
  Filled 2011-07-06: qty 1

## 2011-07-06 NOTE — ED Notes (Addendum)
Into room to see patient. Sitting up in bed. States pain medication has not helped and pain remains an 8\10. Denies nausea. No distress. Call bell within reach. MD made aware.

## 2011-07-06 NOTE — ED Notes (Signed)
Into room to check on patient. Patient, patient belongings, and patient's family no longer in room. Patient eloped.

## 2011-07-06 NOTE — ED Provider Notes (Signed)
History  Scribed for Norma Gaskins, MD, the patient was seen in room APA02/APA02. This chart was scribed by Candelaria Stagers. The patient's care started at 10:18PM     CSN: [id number]  Arrival date & time 07/06/11  1755   First MD Initiated Contact with Patient 07/06/11 2212      Chief Complaint  Patient presents with  . Migraine     The history is provided by the patient.   [name] is a 41 y.o. female who presents to the Emergency Department complaining of gradual onset of a headache that began several hours ago on the front side of her head.  Pt has a h/o headaches and states these sx are similar to past headaches.  She states that she is also experiencing nausea.  She denies fever, vision disturbances, or weakness.  She has not been in contact with anyone else who has been sick.  She did not experience an injury and has no h/o stroke. No one at home has these symptoms No focal weakness No fever   No rash  Past Medical History  Diagnosis Date  . Migraine   . Seizures   . 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 Depression   . Heart murmur     Past Surgical History  Procedure Date  . Knee surgery   . Cesarean section     Family History  Problem Relation Age of Onset  . Heart failure Mother   . Diabetes Mother   . Hyperlipidemia Mother   . Hypertension Mother   . Stroke Sister   . Seizures Sister     History  Substance Use Topics  . Smoking status: Current Everyday Smoker -- 0.5 packs/day    Types: Cigarettes  . Smokeless tobacco: Not on file  . Alcohol Use: No    OB History    Grav Para Term Preterm Abortions TAB SAB Ect Mult Living   8 5 5  3            Review of Systems  Constitutional: Negative for fever.  Eyes: Negative for visual disturbance.  Gastrointestinal: Positive for nausea.  Musculoskeletal: Negative for gait problem.  Skin: Negative for rash.  Neurological: Negative for weakness.  All other systems reviewed and are  negative.    Allergies  Haldol; Penicillins; Darvocet; Imitrex; and Toradol  Home Medications   Current Outpatient Rx  Name Route Sig Dispense Refill  . ACETAMINOPHEN 500 MG PO TABS Oral Take 500 mg by mouth as needed. For pain     . ALPRAZOLAM 1 MG PO TABS Oral Take 1 mg by mouth 3 (three) times daily as needed. For anxiety    . ASPIRIN-ACETAMINOPHEN-CAFFEINE 250-250-65 MG PO TABS Oral Take 2 tablets by mouth once as needed. For migraine    . DIVALPROEX SODIUM 500 MG PO TBEC Oral Take 500 mg by mouth 2 (two) times daily.      Marland Kitchen GABAPENTIN 300 MG PO CAPS Oral Take 300 mg by mouth 3 (three) times daily.      Marland Kitchen LAMOTRIGINE 25 MG PO TABS Oral Take 25 mg by mouth daily.      Marland Kitchen LORAZEPAM 1 MG PO TABS Oral Take 1 mg by mouth every 8 (eight) hours as needed. For anxiety    . MULTIVITAMIN PO Oral Take 1 tablet by mouth daily.      . OXYCODONE-ACETAMINOPHEN 10-325 MG PO TABS Oral Take 1 tablet by mouth every 4 (four) hours as needed.  Pain     . RISPERIDONE 3 MG PO TABS Oral Take 3 mg by mouth at bedtime.      . SERTRALINE HCL 50 MG PO TABS Oral Take 50 mg by mouth every morning.     . TRAZODONE HCL 150 MG PO TABS Oral Take 150 mg by mouth at bedtime.        BP 113/71  Pulse 93  Temp(Src) 98.1 F (36.7 C) (Oral)  Resp 20  Ht 5\' 3"  (1.6 m)  Wt 150 lb (68.04 kg)  BMI 26.57 kg/m2  SpO2 100%  LMP 06/09/2011  Physical Exam Constitutional: well developed, well nourished, no distress Head and Face: normocephalic/atraumatic Eyes: EOMI/PERRL ENMT: mucous membranes moist Neck: supple, no meningeal signs, no bruits CV: no murmur/rubs/gallops noted Lungs: clear to auscultation bilaterally Abd: soft, nontender Extremities: full ROM noted, pulses normal/equal Neuro:  Awake/alert, facies symmetric, no arm or leg drift is noted Cranial nerves 3/4/5/6/01/01/09/11/12 tested and intact Gait normal, no past pointing Skin: no rash/petechiae noted.  Color normal.  Warm Psych: appropriate for age    ED Course  Procedures   DIAGNOSTIC STUDIES: Oxygen Saturation is 100% on room air, normal by my interpretation.    COORDINATION OF CARE:  10:21PM Ordered: metoCLOPramide (REGLAN) injection 10 mg ; diphenhydrAMINE (BENADRYL) injection 50 mg       MDM  Nursing notes reviewed and considered in documentation Previous records reviewed and considered   I personally performed the services described in this documentation, which was scribed in my presence. The recorded information has been reviewed and considered.          Norma Gaskins, MD 07/07/11 [phone]

## 2011-07-06 NOTE — ED Notes (Signed)
MD at bedside to evaluate.

## 2011-07-06 NOTE — ED Notes (Signed)
Into room to assess patient. Resting in bed on right side. States she has a history of migraines. Rates pain at this time an 8\10. States it hurts on her right forehead and behind right eye. States she usually takes Percocet for migraines but is out. Took Tylenol at 1600 with no relief. Denies any blurred vision or other visual disturbances. States she is nauseated. Pupils 3mm bilateral, PERRLA. Denies anything that makes pain better or worse. Migraine started at 1500 today. Denies any needs at this time. No distress. Family with patient. Call bell at bedside. Awaiting MD eval.

## 2011-07-06 NOTE — ED Notes (Signed)
Medicated as ordered for 8\10 headache. Tolerated well. Denies any needs. No distress. Call bell within reach. Family at bedside. No distress. Call bell within reach.

## 2011-07-06 NOTE — ED Notes (Signed)
Resting with eyes closed and lights off. Equal chest rise and fall, regular, nonlabored. No facial grimacing. Call bell within reach. No distress. Family at bedside. Awaiting MD eval.

## 2011-07-06 NOTE — ED Notes (Signed)
Pt c/o migraine with n/v/dizziness x 6 hours.

## 2011-07-10 ENCOUNTER — Emergency Department (HOSPITAL_COMMUNITY): Payer: Self-pay

## 2011-07-10 ENCOUNTER — Emergency Department (HOSPITAL_COMMUNITY)
Admission: EM | Admit: 2011-07-10 | Discharge: 2011-07-10 | Disposition: A | Discharge status: Home / Self Care | Payer: Self-pay | Attending: Emergency Medicine | Admitting: Emergency Medicine

## 2011-07-10 ENCOUNTER — Encounter (HOSPITAL_COMMUNITY): Payer: Self-pay | Admitting: *Deleted

## 2011-07-10 DIAGNOSIS — R05 Cough: Secondary | ICD-10-CM | POA: Insufficient documentation

## 2011-07-10 DIAGNOSIS — Z79899 Other long term (current) drug therapy: Secondary | ICD-10-CM | POA: Insufficient documentation

## 2011-07-10 DIAGNOSIS — R059 Cough, unspecified: Secondary | ICD-10-CM | POA: Insufficient documentation

## 2011-07-10 DIAGNOSIS — J40 Bronchitis, not specified as acute or chronic: Secondary | ICD-10-CM | POA: Insufficient documentation

## 2011-07-10 DIAGNOSIS — R0602 Shortness of breath: Secondary | ICD-10-CM | POA: Insufficient documentation

## 2011-07-10 LAB — COMPREHENSIVE METABOLIC PANEL
AST: 14 U/L (ref 0–37)
Albumin: 3.7 g/dL (ref 3.5–5.2)
Calcium: 9.9 mg/dL (ref 8.4–10.5)
Creatinine, Ser: 0.54 mg/dL (ref 0.50–1.10)
Total Protein: 7.3 g/dL (ref 6.0–8.3)

## 2011-07-10 LAB — CBC
HCT: 42 % (ref 36.0–46.0)
MCHC: 35 g/dL (ref 30.0–36.0)
MCV: 85.5 fL (ref 78.0–100.0)
Platelets: 285 10*3/uL (ref 150–400)
RDW: 12.7 % (ref 11.5–15.5)

## 2011-07-10 LAB — DIFFERENTIAL
Basophils Absolute: 0 10*3/uL (ref 0.0–0.1)
Basophils Relative: 0 % (ref 0–1)
Eosinophils Relative: 0 % (ref 0–5)
Monocytes Absolute: 0.6 10*3/uL (ref 0.1–1.0)

## 2011-07-10 MED ORDER — SODIUM CHLORIDE 0.9 % IV SOLN
Freq: Once | INTRAVENOUS | Status: AC
  Administered 2011-07-10: 1000 mL via INTRAVENOUS

## 2011-07-10 MED ORDER — ONDANSETRON HCL 4 MG/2ML IJ SOLN
4.0000 mg | Freq: Once | INTRAMUSCULAR | Status: AC
  Administered 2011-07-10: 4 mg via INTRAVENOUS
  Filled 2011-07-10: qty 2

## 2011-07-10 MED ORDER — HYDROMORPHONE HCL PF 1 MG/ML IJ SOLN
1.0000 mg | Freq: Once | INTRAMUSCULAR | Status: AC
  Administered 2011-07-10: 1 mg via INTRAVENOUS
  Filled 2011-07-10: qty 1

## 2011-07-10 MED ORDER — OXYCODONE-ACETAMINOPHEN 5-325 MG PO TABS: 1.0000 | ORAL_TABLET | Freq: Four times a day (QID) | ORAL | Status: AC | PRN

## 2011-07-10 MED ORDER — AZITHROMYCIN 250 MG PO TABS
500.0000 mg | ORAL_TABLET | Freq: Once | ORAL | Status: AC
  Administered 2011-07-10: 500 mg via ORAL
  Filled 2011-07-10: qty 2

## 2011-07-10 MED ORDER — HYDROCOD POLST-CHLORPHEN POLST 10-8 MG/5ML PO LQCR
5.0000 mL | Freq: Once | ORAL | Status: AC
  Administered 2011-07-10: 5 mL via GASTROSTOMY

## 2011-07-10 MED ORDER — HYDROCOD POLST-CHLORPHEN POLST 10-8 MG/5ML PO LQCR
ORAL | Status: AC
  Administered 2011-07-10: 5 mL via GASTROSTOMY
  Filled 2011-07-10: qty 5

## 2011-07-10 MED ORDER — HYDROCOD POLST-CHLORPHEN POLST 10-8 MG/5ML PO LQCR: 5.0000 mL | Freq: Two times a day (BID) | ORAL | Status: DC | PRN

## 2011-07-10 MED ORDER — AZITHROMYCIN 250 MG PO TABS: 250.0000 mg | ORAL_TABLET | Freq: Every day | ORAL | Status: AC

## 2011-07-10 NOTE — ED Notes (Signed)
C/o shortness of breath with exertion, chest pain and upper back pain with cough and deep breaths, onset 2 days ago

## 2011-07-10 NOTE — ED Provider Notes (Signed)
History     CSN: [id number]  Arrival date & time 07/10/11  1510   First MD Initiated Contact with Patient 07/10/11 1517      Chief Complaint  Patient presents with  . Shortness of Breath  . Cough    (Consider location/radiation/quality/duration/timing/severity/associated sxs/prior treatment) Patient is a 41 y.o. female presenting with shortness of breath and cough. The history is provided by the patient (the patient complains of a cough and chest pain. The patient states he has left-sided chest pain which is worse with inspiration and cough. She is type of cough which is a yellow-green sputum.). No language interpreter was used.  Shortness of Breath  The current episode started 2 days ago. The problem occurs frequently. The problem has been unchanged. The problem is moderate. The symptoms are relieved by nothing. The symptoms are aggravated by nothing. Associated symptoms include chest pain, cough and shortness of breath. The cough has no precipitants. The cough is productive. Nothing relieves the cough. Nothing worsens the cough. There was no intake of a foreign body. She has had no prior steroid use.  Cough Associated symptoms include chest pain and shortness of breath. Pertinent negatives include no headaches.    Past Medical History  Diagnosis Date  . Migraine   . Seizures   . 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 Depression   . Heart murmur     Past Surgical History  Procedure Date  . Knee surgery   . Cesarean section     Family History  Problem Relation Age of Onset  . Heart failure Mother   . Diabetes Mother   . Hyperlipidemia Mother   . Hypertension Mother   . Stroke Sister   . Seizures Sister     History  Substance Use Topics  . Smoking status: Current Everyday Smoker -- 0.5 packs/day    Types: Cigarettes  . Smokeless tobacco: Not on file  . Alcohol Use: No    OB History    Grav Para Term Preterm Abortions TAB SAB Ect Mult Living   8 5 5  3            Review  of Systems  Constitutional: Negative for fatigue.  HENT: Negative for congestion, sinus pressure and ear discharge.   Eyes: Negative for discharge.  Respiratory: Positive for cough and shortness of breath.   Cardiovascular: Positive for chest pain.  Gastrointestinal: Negative for abdominal pain and diarrhea.  Genitourinary: Negative for frequency and hematuria.  Musculoskeletal: Negative for back pain.  Skin: Negative for rash.  Neurological: Negative for seizures and headaches.  Hematological: Negative.   Psychiatric/Behavioral: Negative for hallucinations.    Allergies  Haldol; Penicillins; Darvocet; Imitrex; and Toradol  Home Medications   Current Outpatient Rx  Name Route Sig Dispense Refill  . ACETAMINOPHEN 500 MG PO TABS Oral Take 500 mg by mouth as needed. For pain     . ALPRAZOLAM 1 MG PO TABS Oral Take 1 mg by mouth 3 (three) times daily as needed. For anxiety    . ASPIRIN-ACETAMINOPHEN-CAFFEINE 250-250-65 MG PO TABS Oral Take 2 tablets by mouth once as needed. For migraine    . DIVALPROEX SODIUM 500 MG PO TBEC Oral Take 500 mg by mouth 2 (two) times daily.      Marland Kitchen GABAPENTIN 300 MG PO CAPS Oral Take 300 mg by mouth 3 (three) times daily.      Marland Kitchen LAMOTRIGINE 25 MG PO TABS Oral Take 25 mg by mouth daily.      Marland Kitchen  LORAZEPAM 1 MG PO TABS Oral Take 1 mg by mouth every 8 (eight) hours as needed. For anxiety    . ADULT MULTIVITAMIN W/MINERALS CH Oral Take 1 tablet by mouth daily.    . OXYCODONE-ACETAMINOPHEN 10-325 MG PO TABS Oral Take 1 tablet by mouth every 4 (four) hours as needed. Pain     . RISPERIDONE 3 MG PO TABS Oral Take 3 mg by mouth at bedtime.      . SERTRALINE HCL 50 MG PO TABS Oral Take 50 mg by mouth every morning.     . TRAZODONE HCL 150 MG PO TABS Oral Take 150 mg by mouth at bedtime.      . AZITHROMYCIN 250 MG PO TABS Oral Take 1 tablet (250 mg total) by mouth daily. 4 tablet 0  . HYDROCOD POLST-CPM POLST ER 10-8 MG/5ML PO LQCR Oral Take 5 mLs by mouth every 12  (twelve) hours as needed. 115 mL 0  . OXYCODONE-ACETAMINOPHEN 5-325 MG PO TABS Oral Take 1 tablet by mouth every 6 (six) hours as needed for pain. 20 tablet 0    BP 121/79  Pulse 78  Temp(Src) 97.8 F (36.6 C) (Oral)  Resp 18  Ht 5\' 3"  (1.6 m)  Wt 150 lb (68.04 kg)  BMI 26.57 kg/m2  SpO2 99%  LMP 07/10/2011  Physical Exam  Constitutional: She is oriented to person, place, and time. She appears well-developed.  HENT:  Head: Normocephalic and atraumatic.  Eyes: Conjunctivae and EOM are normal. No scleral icterus.  Neck: Neck supple. No thyromegaly present.  Cardiovascular: Normal rate and regular rhythm.  Exam reveals no gallop and no friction rub.   No murmur heard. Pulmonary/Chest: No stridor. She has wheezes. She has no rales. She exhibits tenderness.       Mild wheezes  Abdominal: She exhibits no distension. There is no tenderness. There is no rebound.  Musculoskeletal: Normal range of motion. She exhibits no edema.  Lymphadenopathy:    She has no cervical adenopathy.  Neurological: She is oriented to person, place, and time. Coordination normal.  Skin: No rash noted. No erythema.  Psychiatric: She has a normal mood and affect. Her behavior is normal.    ED Course  Procedures (including critical care time)  Labs Reviewed  CBC - Abnormal; Notable for the following:    WBC 11.2 (*)    All other components within normal limits  DIFFERENTIAL - Abnormal; Notable for the following:    Neutrophils Relative 81 (*)    Neutro Abs 9.1 (*)    All other components within normal limits  COMPREHENSIVE METABOLIC PANEL - Abnormal; Notable for the following:    Glucose, Bld 116 (*)    All other components within normal limits   Dg Chest 2 View  07/10/2011  *RADIOLOGY REPORT*  Clinical Data: Short of breath.  Chest pain.  Smoking history.  CHEST - 2 VIEW  Comparison: 06/27/2011  Findings: Artifact overlies the chest.  Heart size is normal. Mediastinal shadows are normal.  There is  bronchial thickening but no infiltrate, collapse or effusion.  No significant bony finding.  IMPRESSION: Bronchitis.  No consolidation or collapse.  Original Report Authenticated By: Thomasenia Sales, M.D.     1. Bronchitis       MDM  Bronchitis with chest wall pain        Benny Lennert, MD 07/10/11 1857

## 2011-07-10 NOTE — ED Notes (Signed)
Patient is alert and oriented x 4 with respirations even and unlabored.  NAD at this time.  Discharge instructions reviewed with patient and patient verbalized understanding.  Pt ambulated to lobby with steady gait, and family member to transport pt home.  

## 2012-07-03 ENCOUNTER — Emergency Department (HOSPITAL_COMMUNITY): Payer: Self-pay

## 2012-07-03 ENCOUNTER — Emergency Department (HOSPITAL_COMMUNITY)
Admission: EM | Admit: 2012-07-03 | Discharge: 2012-07-04 | Disposition: A | Discharge status: Home / Self Care | Payer: Self-pay | Attending: Emergency Medicine | Admitting: Emergency Medicine

## 2012-07-03 ENCOUNTER — Encounter (HOSPITAL_COMMUNITY): Payer: Self-pay | Admitting: *Deleted

## 2012-07-03 DIAGNOSIS — Z7982 Long term (current) use of aspirin: Secondary | ICD-10-CM | POA: Insufficient documentation

## 2012-07-03 DIAGNOSIS — Z8679 Personal history of other diseases of the circulatory system: Secondary | ICD-10-CM | POA: Insufficient documentation

## 2012-07-03 DIAGNOSIS — G40909 Epilepsy, unspecified, not intractable, without status epilepticus: Secondary | ICD-10-CM | POA: Insufficient documentation

## 2012-07-03 DIAGNOSIS — S51809A Unspecified open wound of unspecified forearm, initial encounter: Secondary | ICD-10-CM | POA: Insufficient documentation

## 2012-07-03 DIAGNOSIS — F17200 Nicotine dependence, unspecified, uncomplicated: Secondary | ICD-10-CM | POA: Insufficient documentation

## 2012-07-03 DIAGNOSIS — R110 Nausea: Secondary | ICD-10-CM | POA: Insufficient documentation

## 2012-07-03 DIAGNOSIS — F329 Major depressive disorder, single episode, unspecified: Secondary | ICD-10-CM | POA: Insufficient documentation

## 2012-07-03 DIAGNOSIS — F3289 Other specified depressive episodes: Secondary | ICD-10-CM | POA: Insufficient documentation

## 2012-07-03 DIAGNOSIS — S01501A Unspecified open wound of lip, initial encounter: Secondary | ICD-10-CM | POA: Insufficient documentation

## 2012-07-03 DIAGNOSIS — Z79899 Other long term (current) drug therapy: Secondary | ICD-10-CM | POA: Insufficient documentation

## 2012-07-03 DIAGNOSIS — G40309 Generalized idiopathic epilepsy and epileptic syndromes, not intractable, without status epilepticus: Secondary | ICD-10-CM | POA: Insufficient documentation

## 2012-07-03 MED ORDER — IBUPROFEN 800 MG PO TABS
800.0000 mg | ORAL_TABLET | Freq: Once | ORAL | Status: AC
  Administered 2012-07-04: 800 mg via ORAL
  Filled 2012-07-03: qty 1

## 2012-07-03 MED ORDER — HYDROCODONE-ACETAMINOPHEN 5-325 MG PO TABS
1.0000 | ORAL_TABLET | Freq: Once | ORAL | Status: AC
  Administered 2012-07-04: 1 via ORAL
  Filled 2012-07-03: qty 1
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
## 2012-07-04 MED ORDER — ONDANSETRON 4 MG PO TBDP
4.0000 mg | ORAL_TABLET | Freq: Once | ORAL | Status: AC
Start: 2012-07-04 — End: 2012-07-04
  Administered 2012-07-04: 4 mg via ORAL
  Filled 2012-07-04: qty 1

## 2012-07-04 MED ORDER — ACETAMINOPHEN 500 MG PO TABS
1000.0000 mg | ORAL_TABLET | Freq: Once | ORAL | Status: AC
  Administered 2012-07-04: 1000 mg via ORAL
  Filled 2012-07-04: qty 2
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
## 2012-07-04 NOTE — ED Notes (Signed)
Dressed and sitting on side of bed.  C/O nausea

## 2012-07-04 NOTE — ED Notes (Signed)
Eating crackers and taking coke po.  Nausea subsiding

## 2012-07-07 ENCOUNTER — Encounter (HOSPITAL_COMMUNITY): Payer: Self-pay | Admitting: *Deleted

## 2012-07-07 ENCOUNTER — Emergency Department (HOSPITAL_COMMUNITY)
Admission: EM | Admit: 2012-07-07 | Discharge: 2012-07-07 | Disposition: A | Discharge status: Home / Self Care | Payer: Self-pay | Attending: Emergency Medicine | Admitting: Emergency Medicine

## 2012-07-07 DIAGNOSIS — J0100 Acute maxillary sinusitis, unspecified: Secondary | ICD-10-CM | POA: Insufficient documentation

## 2012-07-07 DIAGNOSIS — Z79899 Other long term (current) drug therapy: Secondary | ICD-10-CM | POA: Insufficient documentation

## 2012-07-07 DIAGNOSIS — J029 Acute pharyngitis, unspecified: Secondary | ICD-10-CM | POA: Insufficient documentation

## 2012-07-07 DIAGNOSIS — Z8669 Personal history of other diseases of the nervous system and sense organs: Secondary | ICD-10-CM | POA: Insufficient documentation

## 2012-07-07 DIAGNOSIS — R0982 Postnasal drip: Secondary | ICD-10-CM | POA: Insufficient documentation

## 2012-07-07 DIAGNOSIS — R011 Cardiac murmur, unspecified: Secondary | ICD-10-CM | POA: Insufficient documentation

## 2012-07-07 DIAGNOSIS — J3489 Other specified disorders of nose and nasal sinuses: Secondary | ICD-10-CM | POA: Insufficient documentation

## 2012-07-07 DIAGNOSIS — F17200 Nicotine dependence, unspecified, uncomplicated: Secondary | ICD-10-CM | POA: Insufficient documentation

## 2012-07-07 DIAGNOSIS — R509 Fever, unspecified: Secondary | ICD-10-CM | POA: Insufficient documentation

## 2012-07-07 DIAGNOSIS — F3289 Other specified depressive episodes: Secondary | ICD-10-CM | POA: Insufficient documentation

## 2012-07-07 DIAGNOSIS — Z8679 Personal history of other diseases of the circulatory system: Secondary | ICD-10-CM | POA: Insufficient documentation

## 2012-07-07 DIAGNOSIS — F329 Major depressive disorder, single episode, unspecified: Secondary | ICD-10-CM | POA: Insufficient documentation

## 2012-07-07 MED ORDER — AZITHROMYCIN 250 MG PO TABS: ORAL_TABLET | ORAL | Status: DC

## 2012-07-07 NOTE — ED Notes (Signed)
Pt c/o sinus infection for a week,

## 2012-07-07 NOTE — ED Provider Notes (Signed)
History     CSN: [id number]  Arrival date & time 07/07/12  1244   First MD Initiated Contact with Patient 07/07/12 1327      Chief Complaint  Patient presents with  . Sinusitis    (Consider location/radiation/quality/duration/timing/severity/associated sxs/prior treatment) HPI Comments: [name] presents with a 1 week history of nasal congestion,  Thick nasal discharge which she states is difficult to clear despite using tylenol cold and sinus medication.  She reports worsening right facial pressure which does not radiate.  She has had subjective fever and sore throat associated with post nasal drip.  She denies significant coughing, no shortness of breath, chest pain, nausea or dizziness.  She reports the tylenol sinus medicine made her feel very jittery and she does not want to continue taking this.  She has tried no other treatments.  The history is provided by the patient.    Past Medical History  Diagnosis Date  . Migraine   . Seizures   . 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 Depression   . Heart murmur     Past Surgical History  Procedure Date  . Knee surgery   . Cesarean section   . Tubal ligation     Family History  Problem Relation Age of Onset  . Heart failure Mother   . Diabetes Mother   . Hyperlipidemia Mother   . Hypertension Mother   . Stroke Sister   . Seizures Sister     History  Substance Use Topics  . Smoking status: Current Every Day Smoker -- 0.5 packs/day    Types: Cigarettes  . Smokeless tobacco: Not on file  . Alcohol Use: Yes     Comment: occ    OB History    Grav Para Term Preterm Abortions TAB SAB Ect Mult Living   8 5 5  3            Review of Systems  Constitutional: Positive for fever.  HENT: Positive for congestion, sore throat, rhinorrhea and sinus pressure. Negative for facial swelling, neck pain, neck stiffness and ear discharge.   Eyes: Negative.   Respiratory: Negative for chest tightness and shortness of breath.     Cardiovascular: Negative for chest pain.  Gastrointestinal: Negative for nausea and abdominal pain.  Genitourinary: Negative.   Musculoskeletal: Negative for joint swelling and arthralgias.  Skin: Negative.  Negative for rash and wound.  Neurological: Negative for dizziness, weakness, light-headedness, numbness and headaches.  Hematological: Negative.   Psychiatric/Behavioral: Negative.     Allergies  Haldol; Penicillins; Darvocet; Imitrex; and Ketorolac tromethamine  Home Medications   Current Outpatient Rx  Name  Route  Sig  Dispense  Refill  . ACETAMINOPHEN 500 MG PO TABS   Oral   Take 500 mg by mouth as needed. For pain          . ALPRAZOLAM 1 MG PO TABS   Oral   Take 1 mg by mouth 3 (three) times daily as needed. For anxiety         . ASPIRIN-ACETAMINOPHEN-CAFFEINE 250-250-65 MG PO TABS   Oral   Take 2 tablets by mouth once as needed. For migraine         . DIVALPROEX SODIUM 500 MG PO TBEC   Oral   Take 500 mg by mouth 2 (two) times daily.           Marland Kitchen GABAPENTIN 300 MG PO CAPS   Oral   Take 300 mg by mouth  3 (three) times daily.           Marland Kitchen LAMOTRIGINE 25 MG PO TABS   Oral   Take 25 mg by mouth daily.           . ADULT MULTIVITAMIN W/MINERALS CH   Oral   Take 1 tablet by mouth daily.         . OXYCODONE-ACETAMINOPHEN 10-325 MG PO TABS   Oral   Take 1 tablet by mouth every 4 (four) hours as needed. Pain          . RISPERIDONE 3 MG PO TABS   Oral   Take 3 mg by mouth at bedtime.           . SERTRALINE HCL 50 MG PO TABS   Oral   Take 50 mg by mouth every morning.          . TRAZODONE HCL 150 MG PO TABS   Oral   Take 150 mg by mouth at bedtime.           . AZITHROMYCIN 250 MG PO TABS      Take 2 tablets by mouth on day one followed by one tablet daily for 4 days.   6 tablet   0     BP 131/80  Pulse 70  Temp 98.1 F (36.7 C)  Resp 18  SpO2 100%  LMP 07/03/2012  Physical Exam  Constitutional: She is oriented to  person, place, and time. She appears well-developed and well-nourished.  HENT:  Head: Normocephalic and atraumatic.  Right Ear: Tympanic membrane and ear canal normal.  Left Ear: Tympanic membrane and ear canal normal.  Nose: Mucosal edema and rhinorrhea present. Right sinus exhibits maxillary sinus tenderness.  Mouth/Throat: Uvula is midline, oropharynx is clear and moist and mucous membranes are normal. No oropharyngeal exudate, posterior oropharyngeal edema, posterior oropharyngeal erythema or tonsillar abscesses.  Eyes: Conjunctivae normal are normal.  Cardiovascular: Normal rate and normal heart sounds.   Pulmonary/Chest: Effort normal. No respiratory distress. She has no wheezes. She has no rales.  Musculoskeletal: Normal range of motion.  Neurological: She is alert and oriented to person, place, and time.  Skin: Skin is warm and dry. No rash noted.  Psychiatric: She has a normal mood and affect.    ED Course  Procedures (including critical care time)  Labs Reviewed - No data to display No results found.   1. Sinusitis, acute maxillary       MDM  zithromax prescribed. Pt encouraged menthol cough throat lozenges,  vicks , humidifier for nasal congestion,  Steam.  Ibuprofen.  Recheck by pcp if not improving over the next several days.          Burgess Amor, [place] 07/07/12 2038

## 2012-07-08 NOTE — ED Provider Notes (Signed)
Medical screening examination/treatment/procedure(s) were performed by non-physician practitioner and as supervising physician I was immediately available for consultation/collaboration.  Leshia Kope, MD 07/08/12 2209 

## 2012-07-18 ENCOUNTER — Encounter (HOSPITAL_COMMUNITY): Payer: Self-pay | Admitting: *Deleted

## 2012-07-18 ENCOUNTER — Emergency Department (HOSPITAL_COMMUNITY): Payer: Self-pay

## 2012-07-18 ENCOUNTER — Emergency Department (HOSPITAL_COMMUNITY)
Admission: EM | Admit: 2012-07-18 | Discharge: 2012-07-18 | Disposition: A | Discharge status: Home / Self Care | Payer: Self-pay | Attending: Emergency Medicine | Admitting: Emergency Medicine

## 2012-07-18 DIAGNOSIS — Z79899 Other long term (current) drug therapy: Secondary | ICD-10-CM | POA: Insufficient documentation

## 2012-07-18 DIAGNOSIS — F17200 Nicotine dependence, unspecified, uncomplicated: Secondary | ICD-10-CM | POA: Insufficient documentation

## 2012-07-18 DIAGNOSIS — R0789 Other chest pain: Secondary | ICD-10-CM | POA: Insufficient documentation

## 2012-07-18 DIAGNOSIS — G40909 Epilepsy, unspecified, not intractable, without status epilepticus: Secondary | ICD-10-CM | POA: Insufficient documentation

## 2012-07-18 DIAGNOSIS — R509 Fever, unspecified: Secondary | ICD-10-CM | POA: Insufficient documentation

## 2012-07-18 DIAGNOSIS — F329 Major depressive disorder, single episode, unspecified: Secondary | ICD-10-CM | POA: Insufficient documentation

## 2012-07-18 DIAGNOSIS — J40 Bronchitis, not specified as acute or chronic: Secondary | ICD-10-CM

## 2012-07-18 DIAGNOSIS — G43909 Migraine, unspecified, not intractable, without status migrainosus: Secondary | ICD-10-CM | POA: Insufficient documentation

## 2012-07-18 DIAGNOSIS — J209 Acute bronchitis, unspecified: Secondary | ICD-10-CM | POA: Insufficient documentation

## 2012-07-18 DIAGNOSIS — R062 Wheezing: Secondary | ICD-10-CM | POA: Insufficient documentation

## 2012-07-18 DIAGNOSIS — F3289 Other specified depressive episodes: Secondary | ICD-10-CM | POA: Insufficient documentation

## 2012-07-18 DIAGNOSIS — R011 Cardiac murmur, unspecified: Secondary | ICD-10-CM | POA: Insufficient documentation

## 2012-07-18 DIAGNOSIS — Z7982 Long term (current) use of aspirin: Secondary | ICD-10-CM | POA: Insufficient documentation

## 2012-07-18 DIAGNOSIS — J9801 Acute bronchospasm: Secondary | ICD-10-CM

## 2012-07-18 MED ORDER — HYDROCODONE-ACETAMINOPHEN 5-325 MG PO TABS
1.0000 | ORAL_TABLET | Freq: Once | ORAL | Status: AC
  Administered 2012-07-18: 1 via ORAL
  Filled 2012-07-18: qty 1

## 2012-07-18 MED ORDER — IBUPROFEN 800 MG PO TABS
800.0000 mg | ORAL_TABLET | Freq: Once | ORAL | Status: AC
Start: 2012-07-18 — End: 2012-07-18
  Administered 2012-07-18: 800 mg via ORAL
  Filled 2012-07-18: qty 1

## 2012-07-18 MED ORDER — PREDNISONE 10 MG PO TABS: 20.0000 mg | ORAL_TABLET | Freq: Every day | ORAL | Status: DC

## 2012-07-18 MED ORDER — METHYLPREDNISOLONE SODIUM SUCC 125 MG IJ SOLR
125.0000 mg | Freq: Once | INTRAMUSCULAR | Status: AC
  Administered 2012-07-18: 125 mg via INTRAMUSCULAR

## 2012-07-18 MED ORDER — ALBUTEROL SULFATE (5 MG/ML) 0.5% IN NEBU
5.0000 mg | INHALATION_SOLUTION | Freq: Once | RESPIRATORY_TRACT | Status: AC
  Administered 2012-07-18: 5 mg via RESPIRATORY_TRACT
  Filled 2012-07-18: qty 1

## 2012-07-18 MED ORDER — AMOXICILLIN 500 MG PO CAPS: 500.0000 mg | ORAL_CAPSULE | Freq: Three times a day (TID) | ORAL | Status: DC

## 2012-07-18 MED ORDER — IPRATROPIUM BROMIDE 0.02 % IN SOLN
0.5000 mg | Freq: Once | RESPIRATORY_TRACT | Status: AC
  Administered 2012-07-18: 0.5 mg via RESPIRATORY_TRACT
  Filled 2012-07-18: qty 2.5

## 2012-07-18 MED ORDER — MOXIFLOXACIN HCL 400 MG PO TABS: 400.0000 mg | ORAL_TABLET | Freq: Every day | ORAL | Status: DC

## 2012-07-18 MED ORDER — METHYLPREDNISOLONE SODIUM SUCC 125 MG IJ SOLR
125.0000 mg | Freq: Once | INTRAMUSCULAR | Status: DC
  Filled 2012-07-18: qty 2

## 2012-07-18 MED ORDER — OXYCODONE-ACETAMINOPHEN 5-325 MG PO TABS: 1.0000 | ORAL_TABLET | Freq: Four times a day (QID) | ORAL | Status: DC | PRN

## 2012-07-18 NOTE — ED Notes (Signed)
Pt returned from xray

## 2012-07-18 NOTE — ED Provider Notes (Signed)
History   This chart was scribed for Norma Lennert, MD, by Frederik Pear, ER scribe. The patient was seen in room [facility] and the patient's care was started at 0959.    CSN: [id number]  Arrival date & time 07/18/12  0941   First MD Initiated Contact with Patient 07/18/12 [phone]      Chief Complaint  Patient presents with  . Cough  . chest discomfort.     (Consider location/radiation/quality/duration/timing/severity/associated sxs/prior treatment) Patient is a 42 y.o. female presenting with cough. The history is provided by the patient. No language interpreter was used.  Cough This is a new problem. The current episode started more than 1 week ago. The problem occurs every few minutes. The cough is non-productive. The fever has been present for 5 days or more. Associated symptoms include wheezing. Pertinent negatives include no chest pain and no headaches. Treatments tried: antibiotics. The treatment provided no relief. She is a smoker. Her past medical history does not include bronchitis, pneumonia, bronchiectasis, COPD, emphysema or asthma.    [name] is a 42 y.o. female who presents to the Emergency Department complaining of constant, moderate chest congestion with associated wheezing, fever, and chills that began more than 2 weeks ago. She denies any associated emesis or diarrhea. She reports that she was seen on 01/12 and diagnosed with a sinus infection and prescribed azithromycin, which she finished 1 week ago. She is a current, every day smoker.  PCP is Dr. Janna Arch.   Past Medical History  Diagnosis Date  . Migraine   . Seizures   . 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 Depression   . Heart murmur     Past Surgical History  Procedure Date  . Knee surgery   . Cesarean section   . Tubal ligation     Family History  Problem Relation Age of Onset  . Heart failure Mother   . Diabetes Mother   . Hyperlipidemia Mother   . Hypertension Mother   . Stroke Sister   .  Seizures Sister     History  Substance Use Topics  . Smoking status: Current Every Day Smoker -- 0.5 packs/day    Types: Cigarettes  . Smokeless tobacco: Not on file  . Alcohol Use: Yes     Comment: occ    OB History    Grav Para Term Preterm Abortions TAB SAB Ect Mult Living   8 5 5  3            Review of Systems  Constitutional: Negative for fatigue.  HENT: Negative for congestion, sinus pressure and ear discharge.   Eyes: Negative for discharge.  Respiratory: Positive for wheezing. Negative for cough.   Cardiovascular: Negative for chest pain.  Gastrointestinal: Negative for abdominal pain and diarrhea.  Genitourinary: Negative for frequency and hematuria.  Musculoskeletal: Negative for back pain.  Skin: Negative for rash.  Neurological: Negative for seizures and headaches.  Hematological: Negative.   Psychiatric/Behavioral: Negative for hallucinations.  All other systems reviewed and are negative.    Allergies  Haldol; Penicillins; Darvocet; Imitrex; Ketorolac tromethamine; and Pseudoephedrine  Home Medications   Current Outpatient Rx  Name  Route  Sig  Dispense  Refill  . ACETAMINOPHEN 500 MG PO TABS   Oral   Take 500 mg by mouth as needed. For pain          . ALBUTEROL SULFATE HFA 108 (90 BASE) MCG/ACT IN AERS   Inhalation   Inhale 2  puffs into the lungs every 6 (six) hours as needed. Shortness of Breath         . ALPRAZOLAM 1 MG PO TABS   Oral   Take 1 mg by mouth 3 (three) times daily as needed. For anxiety         . ASPIRIN-ACETAMINOPHEN-CAFFEINE 250-250-65 MG PO TABS   Oral   Take 2 tablets by mouth once as needed. For migraine         . DIVALPROEX SODIUM 500 MG PO TBEC   Oral   Take 500 mg by mouth 2 (two) times daily.           Marland Kitchen GABAPENTIN 300 MG PO CAPS   Oral   Take 300 mg by mouth 3 (three) times daily.           Marland Kitchen LAMOTRIGINE 25 MG PO TABS   Oral   Take 25 mg by mouth daily.           . ADULT MULTIVITAMIN W/MINERALS  CH   Oral   Take 1 tablet by mouth daily.         . OXYCODONE-ACETAMINOPHEN 10-325 MG PO TABS   Oral   Take 1 tablet by mouth every 4 (four) hours as needed. Pain          . RISPERIDONE 3 MG PO TABS   Oral   Take 3 mg by mouth at bedtime.           . SERTRALINE HCL 50 MG PO TABS   Oral   Take 50 mg by mouth every morning.          . TRAZODONE HCL 150 MG PO TABS   Oral   Take 150 mg by mouth at bedtime as needed. Sleep           BP 113/77  Pulse 76  Temp 98.1 F (36.7 C) (Oral)  Resp 18  Ht 5\' 3"  (1.6 m)  Wt 135 lb (61.236 kg)  BMI 23.91 kg/m2  SpO2 91%  LMP 07/03/2012  Physical Exam  Nursing note and vitals reviewed. Constitutional: She is oriented to person, place, and time. She appears well-developed.  HENT:  Head: Normocephalic and atraumatic.  Eyes: Conjunctivae normal and EOM are normal. No scleral icterus.  Neck: Neck supple. No thyromegaly present.  Cardiovascular: Normal rate and regular rhythm.  Exam reveals no gallop and no friction rub.   No murmur heard. Pulmonary/Chest: No stridor. She has wheezes. She has no rales. She exhibits no tenderness.       She has moderate wheezing throughout.  Abdominal: She exhibits no distension. There is no tenderness. There is no rebound.  Musculoskeletal: Normal range of motion. She exhibits no edema.  Lymphadenopathy:    She has no cervical adenopathy.  Neurological: She is oriented to person, place, and time. Coordination normal.  Skin: No rash noted. No erythema.  Psychiatric: She has a normal mood and affect. Her behavior is normal.    ED Course  Procedures (including critical care time)  DIAGNOSTIC STUDIES: Oxygen Saturation is 98% on room air, normal  by my interpretation.    COORDINATION OF CARE:  10:15- Discussed planned course of treatment with the patient, including a chest X-ray, who is agreeable at this time.  10:30- Medication Orders- methylprednisolone sodium succinate (solu-medrol)  125 mg/5ml injection 125 mg- once, albuterol (proventil) 5 mg/ml) 0.5% nebulizer solution 5 mg- once, ipratropium (atrovent) nebulizer solution 0.5 mg- once.   Labs Reviewed - No  data to display Dg Chest 2 View  07/18/2012  *RADIOLOGY REPORT*  Clinical Data: Shortness of breath, cough and congestion.  CHEST - 2 VIEW  Comparison: Chest x-ray 07/09/2012.  Findings: There is some mild bronchial wall thickening, most pronounced in the right base, particularly in the region of the right middle lobe.  No consolidative air space disease.  No pleural effusions.  Pulmonary vasculature is normal.  Heart size is normal. Mediastinal contours are unremarkable.  IMPRESSION: 1.  Findings are again suggestive of mild acute bronchitis, likely most severe in the right middle lobe.   Original Report Authenticated By: Trudie Reed, M.D.      No diagnosis found.    MDM   The chart was scribed for me under my direct supervision.  I personally performed the history, physical, and medical decision making and all procedures in the evaluation of this patient.Norma Lennert, MD 07/18/12 1400

## 2012-07-18 NOTE — ED Notes (Signed)
Pt states she was recently here and dx with sinus infection. States she is no better and has also developed chest congestion and chest discomfort when taking a deep breath and states chest feels tight. Non productive cough noted. NAD

## 2012-07-18 NOTE — ED Notes (Signed)
Pt medicated and lights turned down.

## 2012-07-28 ENCOUNTER — Emergency Department (HOSPITAL_COMMUNITY): Payer: Self-pay

## 2012-07-28 ENCOUNTER — Emergency Department (HOSPITAL_COMMUNITY)
Admission: EM | Admit: 2012-07-28 | Discharge: 2012-07-29 | Disposition: A | Discharge status: Home / Self Care | Payer: Self-pay | Attending: Emergency Medicine | Admitting: Emergency Medicine

## 2012-07-28 ENCOUNTER — Encounter (HOSPITAL_COMMUNITY): Payer: Self-pay | Admitting: Emergency Medicine

## 2012-07-28 DIAGNOSIS — F3289 Other specified depressive episodes: Secondary | ICD-10-CM | POA: Insufficient documentation

## 2012-07-28 DIAGNOSIS — F17200 Nicotine dependence, unspecified, uncomplicated: Secondary | ICD-10-CM | POA: Insufficient documentation

## 2012-07-28 DIAGNOSIS — R51 Headache: Secondary | ICD-10-CM | POA: Insufficient documentation

## 2012-07-28 DIAGNOSIS — G40909 Epilepsy, unspecified, not intractable, without status epilepticus: Secondary | ICD-10-CM | POA: Insufficient documentation

## 2012-07-28 DIAGNOSIS — F419 Anxiety disorder, unspecified: Secondary | ICD-10-CM

## 2012-07-28 DIAGNOSIS — Z8669 Personal history of other diseases of the nervous system and sense organs: Secondary | ICD-10-CM | POA: Insufficient documentation

## 2012-07-28 DIAGNOSIS — Z79899 Other long term (current) drug therapy: Secondary | ICD-10-CM | POA: Insufficient documentation

## 2012-07-28 DIAGNOSIS — F411 Generalized anxiety disorder: Secondary | ICD-10-CM | POA: Insufficient documentation

## 2012-07-28 DIAGNOSIS — R0789 Other chest pain: Secondary | ICD-10-CM | POA: Insufficient documentation

## 2012-07-28 DIAGNOSIS — Z5900 Homelessness unspecified: Secondary | ICD-10-CM | POA: Insufficient documentation

## 2012-07-28 DIAGNOSIS — R011 Cardiac murmur, unspecified: Secondary | ICD-10-CM | POA: Insufficient documentation

## 2012-07-28 DIAGNOSIS — R45851 Suicidal ideations: Secondary | ICD-10-CM | POA: Insufficient documentation

## 2012-07-28 DIAGNOSIS — IMO0002 Reserved for concepts with insufficient information to code with codable children: Secondary | ICD-10-CM | POA: Insufficient documentation

## 2012-07-28 DIAGNOSIS — Z7982 Long term (current) use of aspirin: Secondary | ICD-10-CM | POA: Insufficient documentation

## 2012-07-28 DIAGNOSIS — F329 Major depressive disorder, single episode, unspecified: Secondary | ICD-10-CM | POA: Insufficient documentation

## 2012-07-28 DIAGNOSIS — Z8679 Personal history of other diseases of the circulatory system: Secondary | ICD-10-CM | POA: Insufficient documentation

## 2012-07-28 LAB — CBC WITH DIFFERENTIAL/PLATELET
HCT: 41.2 % (ref 36.0–46.0)
Hemoglobin: 14.2 g/dL (ref 12.0–15.0)
Lymphocytes Relative: 23 % (ref 12–46)
Lymphs Abs: 2.3 10*3/uL (ref 0.7–4.0)
Monocytes Absolute: 0.6 10*3/uL (ref 0.1–1.0)
Monocytes Relative: 6 % (ref 3–12)
Neutro Abs: 7 10*3/uL (ref 1.7–7.7)
WBC: 10 10*3/uL (ref 4.0–10.5)

## 2012-07-28 LAB — BASIC METABOLIC PANEL
BUN: 12 mg/dL (ref 6–23)
CO2: 31 mEq/L (ref 19–32)
Chloride: 99 mEq/L (ref 96–112)
Creatinine, Ser: 0.71 mg/dL (ref 0.50–1.10)
Glucose, Bld: 102 mg/dL — ABNORMAL HIGH (ref 70–99)

## 2012-07-28 LAB — RAPID URINE DRUG SCREEN, HOSP PERFORMED: Opiates: NOT DETECTED

## 2012-07-28 LAB — PREGNANCY, URINE: Preg Test, Ur: NEGATIVE

## 2012-07-28 LAB — ETHANOL: Alcohol, Ethyl (B): <11 mg/dL (ref 0–11)

## 2012-07-28 MED ORDER — NICOTINE 21 MG/24HR TD PT24
21.0000 mg | MEDICATED_PATCH | Freq: Once | TRANSDERMAL | Status: AC
  Administered 2012-07-28: 21 mg via TRANSDERMAL
  Filled 2012-07-28: qty 1

## 2012-07-28 MED ORDER — ALBUTEROL SULFATE HFA 108 (90 BASE) MCG/ACT IN AERS
2.0000 | INHALATION_SPRAY | Freq: Four times a day (QID) | RESPIRATORY_TRACT | Status: DC | PRN
  Administered 2012-07-28: 2 via RESPIRATORY_TRACT
  Filled 2012-07-28: qty 6.7

## 2012-07-28 MED ORDER — GABAPENTIN 400 MG PO CAPS
400.0000 mg | ORAL_CAPSULE | Freq: Three times a day (TID) | ORAL | Status: DC
  Administered 2012-07-28 – 2012-07-29 (×4): 400 mg via ORAL
  Filled 2012-07-28 (×2): qty 1

## 2012-07-28 MED ORDER — TRAZODONE HCL 150 MG PO TABS
150.0000 mg | ORAL_TABLET | Freq: Every evening | ORAL | Status: DC | PRN
  Administered 2012-07-28: 150 mg via ORAL
  Filled 2012-07-28 (×2): qty 1

## 2012-07-28 MED ORDER — LAMOTRIGINE 25 MG PO TABS
25.0000 mg | ORAL_TABLET | Freq: Every day | ORAL | Status: DC
  Administered 2012-07-28 – 2012-07-29 (×2): 25 mg via ORAL
  Filled 2012-07-28 (×3): qty 1

## 2012-07-28 MED ORDER — SERTRALINE HCL 50 MG PO TABS
50.0000 mg | ORAL_TABLET | ORAL | Status: DC
  Administered 2012-07-29: 50 mg via ORAL
  Filled 2012-07-28 (×2): qty 1

## 2012-07-28 MED ORDER — ALPRAZOLAM 0.5 MG PO TABS
1.0000 mg | ORAL_TABLET | Freq: Three times a day (TID) | ORAL | Status: DC
  Administered 2012-07-28 – 2012-07-29 (×4): 1 mg via ORAL
  Filled 2012-07-28 (×4): qty 2

## 2012-07-28 MED ORDER — ASPIRIN 81 MG PO CHEW
CHEWABLE_TABLET | ORAL | Status: AC
  Administered 2012-07-28: 324 mg via ORAL
  Filled 2012-07-28: qty 4

## 2012-07-28 MED ORDER — ASPIRIN 81 MG PO CHEW
324.0000 mg | CHEWABLE_TABLET | Freq: Once | ORAL | Status: AC
  Administered 2012-07-28: 324 mg via ORAL

## 2012-07-28 MED ORDER — DIVALPROEX SODIUM 250 MG PO DR TAB
500.0000 mg | DELAYED_RELEASE_TABLET | Freq: Two times a day (BID) | ORAL | Status: DC
  Administered 2012-07-28 – 2012-07-29 (×3): 500 mg via ORAL
  Filled 2012-07-28 (×3): qty 2

## 2012-07-28 MED ORDER — OXYCODONE-ACETAMINOPHEN 5-325 MG PO TABS
1.0000 | ORAL_TABLET | Freq: Once | ORAL | Status: AC
  Administered 2012-07-28: 1 via ORAL
  Filled 2012-07-28: qty 1

## 2012-07-28 MED ORDER — ARIPIPRAZOLE 2 MG PO TABS
2.0000 mg | ORAL_TABLET | Freq: Every day | ORAL | Status: DC
  Administered 2012-07-28 – 2012-07-29 (×2): 2 mg via ORAL
  Filled 2012-07-28 (×3): qty 1

## 2012-07-28 MED ORDER — GABAPENTIN 300 MG PO CAPS
ORAL_CAPSULE | ORAL | Status: AC
  Filled 2012-07-28: qty 1

## 2012-07-28 MED ORDER — IBUPROFEN 800 MG PO TABS: 800.0000 mg | ORAL_TABLET | Freq: Once | ORAL | Status: DC

## 2012-07-28 MED ORDER — RISPERIDONE 1 MG PO TABS
4.0000 mg | ORAL_TABLET | Freq: Every day | ORAL | Status: DC
Start: 2012-07-28 — End: 2012-07-29
  Administered 2012-07-28: 4 mg via ORAL
  Filled 2012-07-28 (×2): qty 4

## 2012-07-28 MED ORDER — GABAPENTIN 100 MG PO CAPS
ORAL_CAPSULE | ORAL | Status: AC
  Filled 2012-07-28: qty 1

## 2012-07-28 MED ORDER — ACETAMINOPHEN 500 MG PO TABS
1000.0000 mg | ORAL_TABLET | Freq: Four times a day (QID) | ORAL | Status: DC | PRN
  Administered 2012-07-28 – 2012-07-29 (×3): 1000 mg via ORAL
  Filled 2012-07-28 (×3): qty 2

## 2012-07-28 NOTE — BH Assessment (Signed)
[facility] Assessment Progress Note      07/28/2012 Patient has been referred to [facility] and [facility] for wait lists. [facility] only has [facility] beds at this time, do not accept a wait list.  Contacted [facility], no beds at this time per [facility]  Contacted [facility]; spoke with Larita Fife; she stated to fax it over, but no one would look at it until later tonight.   Patient will remain in ED on wait lists until accepted to an available bed.   Shon Baton, MSW, LCSW, LCASA, CSW-G

## 2012-07-28 NOTE — ED Notes (Addendum)
Pt c/o SI. Denies HI. Denies plan. Pt also states she was seen at [facility] 3 days ago and had elevated cardiac enzymes and was told she was having a heart attack, but left AMA. Reports some tightness in chest today, states it feels more like an anxiety attack.
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
## 2012-07-28 NOTE — ED Notes (Signed)
Prior to entering patients room - Patient observed to be pacing back and forth and around the stretcher in room, drinking sips of liquid at bedside at intervals.  Sitter observing from outside of room with patient in full view.

## 2012-07-28 NOTE — ED Notes (Signed)
Pt upset about not getting another percocet and said yall will have to give me some more nerve medicine now.

## 2012-07-28 NOTE — ED Notes (Signed)
Pt denied admittance to [facility] due to seizures

## 2012-07-28 NOTE — ED Provider Notes (Signed)
History  Scribed for Celene Kras, MD, the patient was seen in room APA16A/APA16A. This chart was scribed by Candelaria Stagers. The patient's care started at 11:36 AM   CSN: [id number]  Arrival date & time 07/28/12  1114   First MD Initiated Contact with Patient 07/28/12 1125      Chief Complaint  Patient presents with  . Suicidal     The history is provided by the patient. No language interpreter was used.   [name] is a 42 y.o. female who presents to the Emergency Department complaining of suicidal ideation that started yesterday.  Pt reports she was seen at [facility] yesterday and was told she was having a heart attack.  She became upset at the staff there and left.  Pt reports she and her husband are having financial troubles and are homeless which is causing the suicidal ideation.  Pt does not have a plan in place.  She is also experiencing a headache and some chest tightness that she associates with anxiety.  She states that the chest tightness is different than previous chest pain.  Pt has h/o depression and has been hospitalized for sx previously.  Pt smokes and drinks alcohol occasionally.     Past Medical History  Diagnosis Date  . Seizures   . 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 Depression   . Heart murmur   . Migraine     Past Surgical History  Procedure Date  . Knee surgery   . Cesarean section   . Tubal ligation     Family History  Problem Relation Age of Onset  . Heart failure Mother   . Diabetes Mother   . Hyperlipidemia Mother   . Hypertension Mother   . Stroke Sister   . Seizures Sister     History  Substance Use Topics  . Smoking status: Current Every Day Smoker -- 0.5 packs/day    Types: Cigarettes  . Smokeless tobacco: Not on file  . Alcohol Use: Yes     Comment: occ    OB History    Grav Para Term Preterm Abortions TAB SAB Ect Mult Living   8 5 5  3            Review of Systems  Constitutional: Negative for fever and chills.   Respiratory: Negative for shortness of breath.   Gastrointestinal: Negative for nausea and vomiting.  Neurological: Positive for headaches. Negative for weakness.  Psychiatric/Behavioral: Positive for suicidal ideas. The patient is nervous/anxious.   All other systems reviewed and are negative.    Allergies  Haldol; Penicillins; Darvocet; Imitrex; Ketorolac tromethamine; and Pseudoephedrine  Home Medications   Current Outpatient Rx  Name  Route  Sig  Dispense  Refill  . ACETAMINOPHEN 500 MG PO TABS   Oral   Take 500 mg by mouth as needed. For pain          . ALBUTEROL SULFATE HFA 108 (90 BASE) MCG/ACT IN AERS   Inhalation   Inhale 2 puffs into the lungs every 6 (six) hours as needed. Shortness of Breath         . ALPRAZOLAM 1 MG PO TABS   Oral   Take 1 mg by mouth 3 (three) times daily as needed. For anxiety         . AMOXICILLIN 500 MG PO CAPS   Oral   Take 1 capsule (500 mg total) by mouth 3 (three) times daily.   42 capsule  0   . ASPIRIN-ACETAMINOPHEN-CAFFEINE 250-250-65 MG PO TABS   Oral   Take 2 tablets by mouth once as needed. For migraine         . DIVALPROEX SODIUM 500 MG PO TBEC   Oral   Take 500 mg by mouth 2 (two) times daily.           Marland Kitchen GABAPENTIN 300 MG PO CAPS   Oral   Take 300 mg by mouth 3 (three) times daily.           Marland Kitchen LAMOTRIGINE 25 MG PO TABS   Oral   Take 25 mg by mouth daily.           Marland Kitchen MOXIFLOXACIN HCL 400 MG PO TABS   Oral   Take 1 tablet (400 mg total) by mouth daily.   10 tablet   0   . ADULT MULTIVITAMIN W/MINERALS CH   Oral   Take 1 tablet by mouth daily.         . OXYCODONE-ACETAMINOPHEN 10-325 MG PO TABS   Oral   Take 1 tablet by mouth every 4 (four) hours as needed. Pain          . OXYCODONE-ACETAMINOPHEN 5-325 MG PO TABS   Oral   Take 1 tablet by mouth every 6 (six) hours as needed for pain.   30 tablet   0   . PREDNISONE 10 MG PO TABS   Oral   Take 2 tablets (20 mg total) by mouth daily.    15 tablet   0   . RISPERIDONE 3 MG PO TABS   Oral   Take 3 mg by mouth at bedtime.           . SERTRALINE HCL 50 MG PO TABS   Oral   Take 50 mg by mouth every morning.          . TRAZODONE HCL 150 MG PO TABS   Oral   Take 150 mg by mouth at bedtime as needed. Sleep           BP 109/76  Pulse 106  Temp 97.6 F (36.4 C) (Oral)  Resp 20  Ht 5\' 3"  (1.6 m)  Wt 140 lb (63.504 kg)  BMI 24.80 kg/m2  SpO2 100%  LMP 07/03/2012  Physical Exam  Nursing note and vitals reviewed. Constitutional: She appears well-developed and well-nourished. No distress.  HENT:  Head: Normocephalic and atraumatic.  Right Ear: External ear normal.  Left Ear: External ear normal.  Eyes: Conjunctivae normal are normal. Right eye exhibits no discharge. Left eye exhibits no discharge. No scleral icterus.  Neck: Neck supple. No tracheal deviation present.  Cardiovascular: Normal rate, regular rhythm and intact distal pulses.   Pulmonary/Chest: Effort normal and breath sounds normal. No stridor. No respiratory distress. She has no wheezes. She has no rales.  Abdominal: Soft. Bowel sounds are normal. She exhibits no distension. There is no tenderness. There is no rebound and no guarding.  Musculoskeletal: She exhibits no edema and no tenderness.  Neurological: She is alert. She has normal strength. No sensory deficit. Cranial nerve deficit:  no gross defecits noted. She exhibits normal muscle tone. She displays no seizure activity. Coordination normal.  Skin: Skin is warm and dry. No rash noted.  Psychiatric: Her speech is not rapid and/or pressured, not delayed and not slurred. She is not agitated. She exhibits a depressed mood. She expresses suicidal ideation. She expresses no homicidal ideation. She expresses no suicidal plans and  no homicidal plans.    ED Course  Procedures   DIAGNOSTIC STUDIES: Oxygen Saturation is 100% on room air, normal by my interpretation.     EKG  Rate: 85  Rhythm:  normal sinus rhythm  QRS Axis: normal  Intervals: normal  ST/T Wave abnormalities: normal  Conduction Disutrbances:none  Narrative Interpretation:   Old EKG Reviewed: none available  COORDINATION OF CARE:  11:45AM Ordered: aspirin chewable tablet 324 mg; Basic metabolic panel; CBC with Differential; Ethanol; Drug screen panel, emergency; Pregnancy, urine   Labs Reviewed  URINE RAPID DRUG SCREEN (HOSP PERFORMED) - Abnormal; Notable for the following:    Benzodiazepines POSITIVE (*)     All other components within normal limits  BASIC METABOLIC PANEL - Abnormal; Notable for the following:    Glucose, Bld 102 (*)     All other components within normal limits  ETHANOL  CBC WITH DIFFERENTIAL  PREGNANCY, URINE  TROPONIN I  VALPROIC ACID LEVEL   Dg Chest 2 View  07/28/2012  *RADIOLOGY REPORT*  Clinical Data: Headache, suicidal ideation  CHEST - 2 VIEW  Comparison: 07/26/2012  Findings: Lungs are essentially clear. No pleural effusion or pneumothorax.  Cardiomediastinal silhouette is within normal limits.  Mild degenerative changes of the visualized thoracolumbar spine.  IMPRESSION: No evidence of acute cardiopulmonary disease.   Original Report Authenticated By: Charline Bills, M.D.      1. Suicidal ideation   2. Anxiety       MDM  The patient's emergency room evaluation was unremarkable for any acute medical conditions. The patient was in the hospital at another facility and reported to have elevated troponin levels. Her current evaluation does not suggest any acute cardiac disease. She has normal troponins, as well as EKGs, and chest x-ray. She is medically clear at this point and we will attempt to get her further treatment for her psychiatric condition   I personally performed the services described in this documentation, which was scribed in my presence. The recorded information has been reviewed and is accurate.         Celene Kras, MD 07/28/12 [phone]

## 2012-07-29 ENCOUNTER — Encounter (HOSPITAL_COMMUNITY): Payer: Self-pay | Admitting: *Deleted

## 2012-07-29 ENCOUNTER — Inpatient Hospital Stay (HOSPITAL_COMMUNITY)
Admission: AD | Admit: 2012-07-29 | Discharge: 2012-08-05 | DRG: 885 | Disposition: A | Discharge status: Home / Self Care | Payer: 59 | Source: Intra-hospital | Attending: Psychiatry | Admitting: Psychiatry

## 2012-07-29 DIAGNOSIS — F411 Generalized anxiety disorder: Secondary | ICD-10-CM | POA: Diagnosis present

## 2012-07-29 DIAGNOSIS — F32A Depression, unspecified: Secondary | ICD-10-CM | POA: Diagnosis present

## 2012-07-29 DIAGNOSIS — R45851 Suicidal ideations: Secondary | ICD-10-CM

## 2012-07-29 DIAGNOSIS — F329 Major depressive disorder, single episode, unspecified: Secondary | ICD-10-CM | POA: Diagnosis present

## 2012-07-29 DIAGNOSIS — Z79899 Other long term (current) drug therapy: Secondary | ICD-10-CM

## 2012-07-29 DIAGNOSIS — F419 Anxiety disorder, unspecified: Secondary | ICD-10-CM

## 2012-07-29 DIAGNOSIS — F332 Major depressive disorder, recurrent severe without psychotic features: Principal | ICD-10-CM | POA: Diagnosis present

## 2012-07-29 MED ORDER — ACETAMINOPHEN 325 MG PO TABS
650.0000 mg | ORAL_TABLET | Freq: Four times a day (QID) | ORAL | Status: DC | PRN
  Administered 2012-07-30: 650 mg via ORAL

## 2012-07-29 MED ORDER — ALUM & MAG HYDROXIDE-SIMETH 200-200-20 MG/5ML PO SUSP
30.0000 mL | ORAL | Status: DC | PRN
  Administered 2012-07-30 – 2012-08-02 (×2): 30 mL via ORAL

## 2012-07-29 MED ORDER — DIVALPROEX SODIUM 500 MG PO DR TAB
1000.0000 mg | DELAYED_RELEASE_TABLET | Freq: Every day | ORAL | Status: DC
  Administered 2012-07-29 – 2012-08-04 (×7): 1000 mg via ORAL
  Filled 2012-07-29 (×4): qty 2
  Filled 2012-07-29: qty 28
  Filled 2012-07-29 (×5): qty 2

## 2012-07-29 MED ORDER — TRAZODONE HCL 50 MG PO TABS
150.0000 mg | ORAL_TABLET | Freq: Every evening | ORAL | Status: DC | PRN
  Administered 2012-07-29 – 2012-07-30 (×2): 150 mg via ORAL
  Filled 2012-07-29 (×2): qty 1

## 2012-07-29 MED ORDER — GABAPENTIN 400 MG PO CAPS
400.0000 mg | ORAL_CAPSULE | Freq: Three times a day (TID) | ORAL | Status: DC
  Administered 2012-07-30 – 2012-08-05 (×21): 400 mg via ORAL
  Filled 2012-07-29 (×2): qty 1
  Filled 2012-07-29: qty 42
  Filled 2012-07-29 (×3): qty 1
  Filled 2012-07-29: qty 42
  Filled 2012-07-29 (×8): qty 1
  Filled 2012-07-29: qty 42
  Filled 2012-07-29 (×9): qty 1

## 2012-07-29 MED ORDER — ARIPIPRAZOLE 2 MG PO TABS
2.0000 mg | ORAL_TABLET | Freq: Every day | ORAL | Status: DC
  Administered 2012-07-30: 2 mg via ORAL
  Filled 2012-07-29 (×2): qty 1

## 2012-07-29 MED ORDER — RISPERIDONE 2 MG PO TABS
4.0000 mg | ORAL_TABLET | Freq: Every day | ORAL | Status: DC
  Administered 2012-07-29 – 2012-08-04 (×7): 4 mg via ORAL
  Filled 2012-07-29: qty 28
  Filled 2012-07-29 (×9): qty 2

## 2012-07-29 MED ORDER — MAGNESIUM HYDROXIDE 400 MG/5ML PO SUSP
30.0000 mL | Freq: Every day | ORAL | Status: DC | PRN
  Administered 2012-07-30 – 2012-08-02 (×2): 30 mL via ORAL

## 2012-07-29 MED ORDER — GABAPENTIN 400 MG PO CAPS
ORAL_CAPSULE | ORAL | Status: AC
  Filled 2012-07-29: qty 1

## 2012-07-29 MED ORDER — ACETAMINOPHEN 500 MG PO TABS
1000.0000 mg | ORAL_TABLET | Freq: Four times a day (QID) | ORAL | Status: DC | PRN
  Administered 2012-07-29 – 2012-08-05 (×2): 1000 mg via ORAL
  Filled 2012-07-29 (×2): qty 2

## 2012-07-29 MED ORDER — NICOTINE 21 MG/24HR TD PT24
21.0000 mg | MEDICATED_PATCH | Freq: Every day | TRANSDERMAL | Status: DC
  Administered 2012-07-29: 21 mg via TRANSDERMAL
  Filled 2012-07-29: qty 1

## 2012-07-29 MED ORDER — SERTRALINE HCL 50 MG PO TABS
50.0000 mg | ORAL_TABLET | ORAL | Status: DC
  Administered 2012-07-30: 50 mg via ORAL
  Filled 2012-07-29 (×3): qty 1

## 2012-07-29 MED ORDER — ALBUTEROL SULFATE HFA 108 (90 BASE) MCG/ACT IN AERS: 2.0000 | INHALATION_SPRAY | Freq: Four times a day (QID) | RESPIRATORY_TRACT | Status: DC | PRN

## 2012-07-29 NOTE — ED Notes (Signed)
Report given to Minerva Areola, Charity fundraiser at [facility]. Awaiting [facility] for transport.

## 2012-07-29 NOTE — ED Notes (Signed)
Patient lying on right side. NAD noted.

## 2012-07-29 NOTE — ED Notes (Signed)
Patient requesting Tylenol for headache. Patient informed I will bring it with her daily medications.

## 2012-07-29 NOTE — [facility] Counselor (Signed)
Received a call from Samara Deist in admissions at [facility]. She reported that there would be no 400 beds today. Review of F Ferrell's assessment, indicates that the patient is not hallucinated nor delusional. This informations was given to Ms Hyacinth Meeker, she will make necessary changes and there should be a bed later today for the patient.  The patient does remain suicidal and she cannot contract for safety.

## 2012-07-29 NOTE — ED Notes (Signed)
Patient daughter called. Patient resting. Will tell patient her daughter called and give her the opportunity to return the call when she awakens. Daughter informed of this and agreeable.

## 2012-07-29 NOTE — ED Notes (Signed)
Pt discharged to [facility] 

## 2012-07-29 NOTE — [facility] Counselor (Signed)
Pt was accepted to [facility] by Shuvon Rankin,NP to Dr Daleen Bo, Room 507-1. Informed Dr Devoria Albe and pt nurse, Tiffaney. Gave number to call report.  See support paperwork.

## 2012-07-29 NOTE — Progress Notes (Signed)
[id number] Patient awaits placement. Continues to have suicidal ideation.

## 2012-07-29 NOTE — ED Provider Notes (Signed)
This chart was scribed for [name] by Charolett Bumpers, ED Scribe. The patient was seen in room APA16A/APA16A. Patient's care was started at 1830.  18:15 Samson Frederic, ACT states accepted at [facility] by Dr Daleen Bo  18:31-Checked in with pt. Pt is complaining of back pain and is requesting pain medication.She can have tylenol or motrin that she has ordered. Informed pt that she has been approved for a bed at [facility].    I personally performed the services described in this documentation, which was scribed in my presence. The recorded information has been reviewed and considered.  Norma Albe, MD, FACEP     Ward Givens, MD 07/29/12 [phone]

## 2012-07-29 NOTE — ED Notes (Signed)
Patient lying on right side. No distress.

## 2012-07-30 MED ORDER — NICOTINE 21 MG/24HR TD PT24
21.0000 mg | MEDICATED_PATCH | Freq: Every day | TRANSDERMAL | Status: DC
  Administered 2012-07-30 – 2012-08-05 (×7): 21 mg via TRANSDERMAL
  Filled 2012-07-30 (×10): qty 1

## 2012-07-30 MED ORDER — ALPRAZOLAM 1 MG PO TABS
1.0000 mg | ORAL_TABLET | Freq: Two times a day (BID) | ORAL | Status: DC
  Administered 2012-07-30 – 2012-08-02 (×6): 1 mg via ORAL
  Filled 2012-07-30 (×6): qty 1

## 2012-07-30 MED ORDER — SERTRALINE HCL 25 MG PO TABS
75.0000 mg | ORAL_TABLET | Freq: Every day | ORAL | Status: DC
  Administered 2012-07-31 – 2012-08-03 (×4): 75 mg via ORAL
  Filled 2012-07-30 (×6): qty 3
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
## 2012-07-30 NOTE — Tx Team (Addendum)
Initial Interdisciplinary Treatment Plan  PATIENT STRENGTHS: (choose at least two) Ability for insight Average or above average intelligence Communication skills Supportive family/friends Work skills  PATIENT STRESSORS: Financial difficulties Health problems Occupational concerns   PROBLEM LIST: Problem List/Patient Goals Date to be addressed Date deferred Reason deferred Estimated date of resolution  Suicide Ideations 07-29-12     Depression 07-29-12     Hx. Of seizures, migraines and chronic knee pain 07-29-12                                          DISCHARGE CRITERIA:  Ability to meet basic life and health needs Improved stabilization in mood, thinking, and/or behavior Need for constant or close observation no longer present Reduction of life-threatening or endangering symptoms to within safe limits  PRELIMINARY DISCHARGE PLAN: Attend aftercare/continuing care group Outpatient therapy Participate in family therapy Placement in alternative living arrangements  PATIENT/FAMIILY INVOLVEMENT: This treatment plan has been presented to and reviewed with the patient, [name], and/or family member,  The patient and family have been given the opportunity to ask questions and make suggestions.  Mickeal Needy 07/30/2012, 4:26 AM

## 2012-07-30 NOTE — Progress Notes (Signed)
Grief and Loss Group  Group discussed significant losses in life and reactions to losses.  Group also explored emotions, coping skills for loss, and limits of control.  Pt was present in group, but appeared to have flat affect and low energy.  Pt did not share in group and was in and out of group room.  Whitney P Akers Counseling Intern [employer]

## 2012-07-30 NOTE — Progress Notes (Signed)
[facility] LCSW Aftercare Discharge Planning Group Note  07/30/2012 10:01 AM  Participation Quality:  Appropriate  Affect:  Appropriate, Depressed and Flat  Cognitive:  Alert and Appropriate  Insight:  Engaged  Engagement in Group:  Engaged  Modes of Intervention:  Exploration, Problem-solving, Rapport Building and Support  Summary of Progress/Problems:  Patient advised she admitted to hospital due to SI.  She endorses SI but contracts for safety.  She advised of husband losing his job after 24 years of employment; losing their home, and being homeless.  She advised that she witnessed her mother-in-law being murdered a year ago by her grandson, and her father dying eight months ago.  Patient rates all symptoms at ten. She is followed outpatient by [facility] in [place].  Patient will need assistance with medications. Patient shared she there were other concerns but want to share those privately.  She was provided with daily workbook  [name] 07/30/2012, 10:01 AM

## 2012-07-30 NOTE — [facility] Suicide Risk Assessment (Signed)
Suicide Risk Assessment  Admission Assessment     Nursing information obtained from:  Patient Demographic factors:  Caucasian;Low socioeconomic status;Unemployed Current Mental Status:  Suicidal ideation indicated by patient Loss Factors:  Financial problems / change in socioeconomic status Historical Factors:  Prior suicide attempts;Family history of mental illness or substance abuse Risk Reduction Factors:  Sense of responsibility to family;Living with another person, especially a relative;Positive social support  CLINICAL FACTORS:   Depression:   Anhedonia Hopelessness Impulsivity Insomnia Severe  COGNITIVE FEATURES THAT CONTRIBUTE TO RISK:  Thought constriction (tunnel vision)    SUICIDE RISK:   Moderate:  Frequent suicidal ideation with limited intensity, and duration, some specificity in terms of plans, no associated intent, good self-control, limited dysphoria/symptomatology, some risk factors present, and identifiable protective factors, including available and accessible social support.  PLAN OF CARE: Adjust medicationsAS NEEDED. Encourage attending groups. Plan for discharge when stable.  I certify that inpatient services furnished can reasonably be expected to improve the patient's condition.  [name] 07/30/2012, 10:54 AM
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
## 2012-07-30 NOTE — Progress Notes (Signed)
[facility] LCSW Group Therapy      Feelings About Diagnosis 1:15 - 2:30 PM          07/30/2012 3:43 PM  Type of Therapy:  Group Therapy  Participation Level:  Minimal  Participation Quality:  Appropriate and Attentive  Affect:  Appropriate and Depressed  Cognitive:  Appropriate  Insight:  Developing/Improving  Engagement in Therapy:  Developing/Improving  Modes of Intervention:  Discussion, Exploration, Problem-solving, Rapport Building and Role-play  Summary of Progress/Problems:  Patient shared she has always been call crazy, pill head, and other names but she knows who she is and that is a good person.    [name] 07/30/2012, 3:43 PM

## 2012-07-30 NOTE — Progress Notes (Signed)
D:  Patient's self inventory sheet, patient has fair sleep, needs sleep medication, has poor appetite, low energy level, improving attention span.  Rated depression and hopelessness #8.  Denied withdrawals.  SI, contracts for safety.  Has experienced headache in past 24 hours. Pain goal to not be in pain, worst pain #8.  Not sure how to better take care of herself after discharge.  Stated she usually takes depakote 500 mg in morning and depakote 500 mg at night.   Denied HI.   Denied visual hallucinations.   Does hear voices at time.  Denied hearing voices today.  Not sure of discharge plans.  No problems taking medications after discharge. A:  Medications administered per MD order.  Support and encouragement given throughout day.  Support and safety checks completed as ordered. R:  Following treatment plan.   Denied HI.   Denied A/V hallucinations today.  SI, contracts for safety.  Patient remains safe and receptive on unit.  Will continue to monitor. Patient has attended and participated in groups today.
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
## 2012-07-31 DIAGNOSIS — F419 Anxiety disorder, unspecified: Secondary | ICD-10-CM | POA: Diagnosis present

## 2012-07-31 DIAGNOSIS — F329 Major depressive disorder, single episode, unspecified: Secondary | ICD-10-CM | POA: Diagnosis present

## 2012-07-31 MED ORDER — MAGNESIUM CITRATE PO SOLN
1.0000 | Freq: Once | ORAL | Status: AC
  Administered 2012-07-31: 1 via ORAL

## 2012-07-31 MED ORDER — MIRTAZAPINE 15 MG PO TABS
7.5000 mg | ORAL_TABLET | Freq: Every day | ORAL | Status: DC
  Administered 2012-07-31 – 2012-08-04 (×5): 7.5 mg via ORAL
  Filled 2012-07-31 (×2): qty 0.5
  Filled 2012-07-31 (×2): qty 1
  Filled 2012-07-31 (×2): qty 0.5
  Filled 2012-07-31: qty 7
  Filled 2012-07-31: qty 0.5

## 2012-07-31 MED ORDER — TRAMADOL HCL 50 MG PO TABS
50.0000 mg | ORAL_TABLET | Freq: Four times a day (QID) | ORAL | Status: DC | PRN
  Administered 2012-07-31 – 2012-08-02 (×5): 50 mg via ORAL
  Filled 2012-07-31 (×5): qty 1

## 2012-07-31 NOTE — Progress Notes (Deleted)
[facility] LCSW Group Therapy  07/31/2012 3:09 PM  Type of Therapy:  Group Therapy  Participation Level:  Minimal  Participation Quality:  Inattentive  Affect:  Blunted and Depressed  Cognitive:  Appropriate  Insight:  Lacking  Engagement in Therapy:  Lacking  Modes of Intervention:  Discussion, Exploration, Problem-solving, Rapport Building and Support  Summary of Progress/Problems:  Patient attended group but did not participate during her time there and left before group was completed.  [name] 07/31/2012, 3:09 PM

## 2012-07-31 NOTE — Progress Notes (Signed)
Adult Psychoeducational Group Note  Date:  07/31/2012 Time:  3:43 PM  Group Topic/Focus:  Personal Choices and Values:   The focus of this group is to help patients assess and explore the importance of values in their lives, how their values affect their decisions, how they express their values and what opposes their expression.  Participation Level:  Minimal  Participation Quality:  Appropriate and Attentive  Affect:  Appropriate  Cognitive:  Appropriate  Insight: Appropriate  Engagement in Group:  Developing/Improving and Engaged  Modes of Intervention:  Discussion, Education, Problem-solving and Support  Additional Comments:  [name] attend group and shared at times during group. Patient did stated what were some of her negative and positive choices she has made throughout her life span. Patient also completed her worksheets in her workbook on identifying values and choosing a value oriented life.    [name] 07/31/2012, 3:43 PM

## 2012-07-31 NOTE — Progress Notes (Signed)
[facility] LCSW Aftercare Discharge Planning Group Note  07/31/2012 1:04 PM  Participation Quality:  Appropriate  Affect:  Appropriate, Depressed and Flat  Cognitive:  Alert and Appropriate  Insight:  Engaged  Engagement in Group:  Engaged  Modes of Intervention:  Exploration, Problem-solving, Rapport Building and Support  Summary of Progress/Problems:  Patient continues to endorse SI but contracts for safety.  She rates depression at five and anxiety at seven. Patient to follow up with [facility] Michell Heinrich.  She was provided with daily workbook  [name] 07/31/2012, 1:04 PM

## 2012-07-31 NOTE — Tx Team (Signed)
Interdisciplinary Treatment Plan Update (Adult)  Date:  07/31/2012  Time Reviewed:  8:37 AM   Progress in Treatment: Attending groups:   Yes   Participating in groups:  Yes Taking medication as prescribed:  Yes Tolerating medication:  Yes Family/Significant othe contact made: Contact to be made with family Patient understands diagnosis:  Yes Discussing patient identified problems/goals with staff: Yes Medical problems stabilized or resolved: Yes Denies suicidal/homicidal ideation: No, but contracts for safety Issues/concerns per patient self-inventory:  Other:   New problem(s) identified:  Reason for Continuation of Hospitalization: Anxiety Depression Medication stabilization Suicidal ideation  Interventions implemented related to continuation of hospitalization:  Medication Management; safety checks q 15 mins  Additional comments:  Estimated length of stay:  2-3 days  Discharge Plan:  Home with outpatient follow up  New goal(s):  Review of initial/current patient goals per problem list:    1.  Goal(s): Eliminate SI/other thoughts of self harm (Patient will no longer endorse SI/HI or thoughts of self harm)   Met:  No  Target date: d/c  As evidenced by: Patient continues to endorses SI but contracts for safety    2.  Goal (s):Reduce depression/anxiety (Paitent will rate symptoms at four or below)  Met: No  Target date: d/c  As evidenced by: Patient rates symptoms at five and seven today  3.  Goal(s):.stabilize on meds (Patient will report being stabilized on medications   Met:  No  Target date: d/c  As evidenced by: Patient continues to endorse symptoms    4.  Goal(s): Refer for outpatient follow up (Follow up appointment will be scheduled)   Met:  No  Target date: d/c  As evidenced by: Follow up appointment scheduled    Attendees: Patient:   07/31/2012 8:37 AM  Physican:  Patrick North, MD 07/31/2012 8:37 AM  Nursing:    Harold Barban, RN  07/31/2012 8:37 AM   Nursing:   Chinita Greenland, RN 07/31/2012 8:37 AM   Clinical Social Worker:  Juline Patch, LCSW 07/31/2012 8:37 AM   Other: Tera Helper, PHM-NP 07/31/2012 8:37 AM   Other:         07/31/2012 8:37 AM Other:        07/31/2012 8:37 AM

## 2012-07-31 NOTE — Progress Notes (Signed)
[facility] LCSW Group Therapy  07/31/2012 3:14 PM  Type of Therapy:  Group Therapy  Participation Level:  Active  Participation Quality:  Appropriate and Attentive  Affect:  Appropriate, Depressed  Cognitive:  Appropriate  Insight:  Engaged  Engagement in Therapy:  Engaged  Modes of Intervention:  Discussion, Exploration, Problem-solving, Rapport Building and Support  Summary of Progress/Problems:  Patient shared she enjoyed the exercise on mindfulness and felt that she was at the beach and smelled the salt waters.  Patient shared over the years she has learned to control her feelings by walking away and taking a drive.  Patient was encouraged to followed with with concerned she walked away from at some point rather than leaving situation unresolved.  [name] 07/31/2012, 3:14 PM

## 2012-07-31 NOTE — Progress Notes (Signed)
Writer spoke with patient at medication window and she requested medication for indigestion and constipation which she received. Patient reports that she has not had a bowel movement in three days. Patient reports that her day has been ok and she reports passive si and verbally contracts for safety. Patient reports auditory hallucinations and has conversation with family members but no ones there. She reports having auditory hallucinations since her mother in law's murder in 2006. Patient denies hi/visual hallucinations. Patient informed of her hs medications and is agreeable to taking them. Support and encouragement offered, safety maintained on unit, will continue to monitor.

## 2012-07-31 NOTE — Progress Notes (Signed)
[facility] MD Progress Note  07/31/2012 3:49 PM [name]  MRN:  [id number] Subjective:  Depression 8/10, anxiety 9/10, complained of trazodone making her legs "shaky" when she went to the bathroom during the night, magnesium citrate is working, constipation resolved, still complaining of headaches--ultram placed (she takes Percocets at home for it) Diagnosis:   Axis I: Anxiety Disorder NOS and Depressive Disorder NOS Axis II: Deferred Axis III:  Past Medical History  Diagnosis Date  . Seizures   . 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 Depression   . Heart murmur   . Migraine    Axis IV: economic problems, other psychosocial or environmental problems, problems related to social environment and problems with primary support group Axis V: 41-50 serious symptoms  ADL's:  Intact  Sleep: Fair  Appetite:  Fair  Suicidal Ideation:  Denies Homicidal Ideation:  Denies  Psychiatric Specialty Exam: Review of Systems  Constitutional: Negative.   Eyes: Negative.   Respiratory: Negative.   Cardiovascular: Negative.   Gastrointestinal: Negative.   Genitourinary: Negative.   Musculoskeletal: Negative.   Skin: Negative.   Neurological: Positive for headaches.  Endo/Heme/Allergies: Negative.   Psychiatric/Behavioral: Positive for depression. The patient is nervous/anxious.     Blood pressure 97/70, pulse 100, temperature 98.1 F (36.7 C), temperature source Oral, resp. rate 18, height 5\' 4"  (1.626 m), weight 63.957 kg (141 lb), last menstrual period 07/29/2012, SpO2 97.00%.Body mass index is 24.20 kg/(m^2).  General Appearance: Casual  Eye Contact::  Fair  Speech:  Normal Rate  Volume:  Normal  Mood:  Anxious and Depressed  Affect:  Congruent  Thought Process:  Coherent  Orientation:  Full (Time, Place, and Person)  Thought Content:  WDL  Suicidal Thoughts:  No  Homicidal Thoughts:  No  Memory:  Immediate;   Fair Recent;   Fair Remote;   Fair  Judgement:  Fair  Insight:  Fair   Psychomotor Activity:  Decreased  Concentration:  Fair  Recall:  Fair  Akathisia:  No  Handed:  Right  AIMS (if indicated):     Assets:  Communication Skills Desire for Improvement Resilience  Sleep:  Number of Hours: 6.5    Current Medications: Current Facility-Administered Medications  Medication Dose Route Frequency Provider Last Rate Last Dose  . acetaminophen (TYLENOL) tablet 1,000 mg  1,000 mg Oral Q6H PRN Verne Spurr, PA-C   1,000 mg at 07/29/12 2312  . acetaminophen (TYLENOL) tablet 650 mg  650 mg Oral Q6H PRN Verne Spurr, PA-C   650 mg at 07/30/12 1659  . albuterol (PROVENTIL HFA;VENTOLIN HFA) 108 (90 BASE) MCG/ACT inhaler 2 puff  2 puff Inhalation Q6H PRN Verne Spurr, PA-C      . ALPRAZolam Prudy Feeler) tablet 1 mg  1 mg Oral BID Himabindu Ravi, MD   1 mg at 07/31/12 0806  . alum & mag hydroxide-simeth (MAALOX/MYLANTA) 200-200-20 MG/5ML suspension 30 mL  30 mL Oral Q4H PRN Verne Spurr, PA-C   30 mL at 07/30/12 2010  . divalproex (DEPAKOTE) DR tablet 1,000 mg  1,000 mg Oral QHS Verne Spurr, PA-C   1,000 mg at 07/30/12 2228  . gabapentin (NEURONTIN) capsule 400 mg  400 mg Oral TID Verne Spurr, PA-C   400 mg at 07/31/12 1206  . magnesium hydroxide (MILK OF MAGNESIA) suspension 30 mL  30 mL Oral Daily PRN Verne Spurr, PA-C   30 mL at 07/30/12 2010  . nicotine (NICODERM CQ - dosed in mg/24 hours) patch 21 mg  21  mg Transdermal Q0600 Verne Spurr, PA-C   21 mg at 07/31/12 3086  . risperiDONE (RISPERDAL) tablet 4 mg  4 mg Oral QHS Verne Spurr, PA-C   4 mg at 07/30/12 2227  . sertraline (ZOLOFT) tablet 75 mg  75 mg Oral Daily Himabindu Ravi, MD   75 mg at 07/31/12 0806  . traZODone (DESYREL) tablet 150 mg  150 mg Oral QHS PRN Verne Spurr, PA-C   150 mg at 07/30/12 2227    Lab Results: No results found for this or any previous visit (from the past 48 hour(s)).  Physical Findings: AIMS: Facial and Oral Movements Muscles of Facial Expression: None, normal Lips and  Perioral Area: None, normal Jaw: None, normal Tongue: None, normal,Extremity Movements Upper (arms, wrists, hands, fingers): None, normal Lower (legs, knees, ankles, toes): None, normal, Trunk Movements Neck, shoulders, hips: None, normal, Overall Severity Severity of abnormal movements (highest score from questions above): None, normal Incapacitation due to abnormal movements: None, normal Patient's awareness of abnormal movements (rate only patient's report): No Awareness, Dental Status Current problems with teeth and/or dentures?: No Does patient usually wear dentures?: No  CIWA:  CIWA-Ar Total: 0  COWS:  COWS Total Score: 1   Treatment Plan Summary: Daily contact with patient to assess and evaluate symptoms and progress in treatment Medication management  Plan:  Review of chart, vital signs, medications, and notes. 1-Individual and group therapy 2-Medication management for depression and anxiety:  Medications reviewed with the patient and she stated her trazodone made her legs "shaky" and she still has headaches (takes percocets at home), trazodone discontinued and Remeron ordered, complained of constipation, no BM x 5 days, milk of magnesia did not help, magnesium citrate ordered with relief 3-Coping skills for depression, anxiety, and headaches 4-Continue crisis stabilization and management 5-Address health issues--monitoring pain 6-Treatment plan in progress to prevent relapse of depression and anxiety  Medical Decision Making Problem Points:  Established problem, stable/improving (1) and Review of psycho-social stressors (1) Data Points:  Review of medication regiment & side effects (2)  I certify that inpatient services furnished can reasonably be expected to improve the patient's condition.   Nanine Means, PMH-NP 07/31/2012, 3:49 PM

## 2012-07-31 NOTE — Progress Notes (Signed)
D:  Kosha reports that she slept ok and her energy level is normal.  She is rating depression at 7/10 and hopelessness at 7/10.  She complained of constipation this morning and received mag citrate with good results. She has some passive SI that comes and goes, but she contracts for safety.  She denies HI/AVH.   A:  Safety checks q 16 minutes.  Medication given as ordered.  Emotional support provided. R:  Safety maintained on unit.

## 2012-07-31 NOTE — Progress Notes (Signed)
[facility] Group Notes:  (Nursing/MHT/Case Management/Adjunct)  Type of Therapy:  Psychoeducational Skills  Participation Level:  Minimal  Participation Quality:  Appropriate and Attentive  Affect:  Blunted, Depressed and Lethargic  Cognitive:  Appropriate and Oriented  Insight:  Improving  Engagement in Group:  Engaged  Modes of Intervention:  Activity, Clarification, Discussion, Education, Problem-solving, Rapport Building, Socialization and Support  Summary of Progress/Problems: [name] attended psychoeducational group on labels. 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 [name] was quiet but participated with prompting while group discussed what labels are, how we use them, how they affect the way we think about and perceive the world, and listed positive and negative labels they have used or been called. [name] stated a negative label she has dealt with is "pill head". [name] was given homework assignment to list 10 words she has been labeled and to find the reality of the situation/label.    Wandra Scot 07/31/2012 1:02 PM

## 2012-08-01 DIAGNOSIS — F329 Major depressive disorder, single episode, unspecified: Secondary | ICD-10-CM

## 2012-08-01 NOTE — Progress Notes (Signed)
Patient ID: [name], female   DOB: 1970/09/29, 42 y.o.   MRN: [id number] D: Pt. Reports passive SI, but no plan, contracts for safety. "I love group, just to know that people going through the same thing I'm going through" A: Writer encouraged pt. To utilize coping skills learned during groups for daily living. Staff will monitor q67min for safety. Writer encouraged group. R: Pt. Is safe on the unit. Pt. Attends group.

## 2012-08-01 NOTE — Progress Notes (Signed)
[facility] LCSW Aftercare Discharge Planning Group Note  08/01/2012 4:55 PM  Participation Quality:  Appropriate  Affect:  Appropriate, Depressed and Flat  Cognitive:  Alert and Appropriate  Insight:  Engaged  Engagement in Group:  Engaged  Modes of Intervention:  Exploration, Problem-solving, Rapport Building and Support  Summary of Progress/Problems:  Patient continues to endorse SI but contracts for safety.  She rates anxiety at eight and all other symptoms at six.    [name] 08/01/2012, 4:55 PM

## 2012-08-01 NOTE — Progress Notes (Signed)
Patient ID: [name], female   DOB: [birth year], 42 y.o.   MRN: [id number] D: Pt. Reports family visited tonight. Pt. Reports depression at "5" of 10. Pt. Seen interacting on the unit especially with one female client. A: Writer encouraged pt. To attend group. Staff will monitor q44min for safety. R: Pt. Is safe on the unit. Pt. Attended karaoke and danced.

## 2012-08-01 NOTE — Progress Notes (Signed)
Reviewed. Patient haas been constantly seeking controlled substances for pain/ anxiety since admission. Will address this issue.

## 2012-08-01 NOTE — Progress Notes (Signed)
[facility] LCSW Group Therapy  [facility]  08/01/2012 4:57 PM  Type of Therapy:  Group Therapy  Participation Level:  Minimal  Participation Quality:  Drowsy and Inattentive  Affect:  Appropriate  Cognitive:  Appropriate  Insight:  Limited  Engagement in Therapy:  Limited  Modes of Intervention:  Discussion, Education, Exploration, Rapport Building and Support  Summary of Progress/Problems:  Patient was very drowsy during group.  She made not comments on presentation.  [name] 08/01/2012, 4:57 PM

## 2012-08-01 NOTE — Progress Notes (Signed)
[facility] MD Progress Note  08/01/2012 1:05 PM [name]  MRN:  [id number] Subjective:  Patient reports tolerating Remeron, slept well. Continues to endorse anxiety at 9, having auditory hallucinations. Having suicidal thoughts.  Diagnosis:   Axis I: Depressive Disorder NOS Axis II: Deferred Axis III:  Past Medical History  Diagnosis Date  . Seizures   . 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 Depression   . Heart murmur   . Migraine    Axis IV: other psychosocial or environmental problems Axis V: 41-50 serious symptoms  ADL's:  Intact  Sleep: Fair  Appetite:  Fair   Psychiatric Specialty Exam: Review of Systems  Constitutional: Negative.   HENT: Negative.   Eyes: Negative.   Respiratory: Negative.   Cardiovascular: Negative.   Gastrointestinal: Negative.   Genitourinary: Negative.   Musculoskeletal: Negative.   Skin: Negative.   Neurological: Negative.   Endo/Heme/Allergies: Negative.   Psychiatric/Behavioral: Positive for depression and suicidal ideas. The patient is nervous/anxious.     Blood pressure 105/75, pulse 108, temperature 97.7 F (36.5 C), temperature source Oral, resp. rate 16, height 5\' 4"  (1.626 m), weight 63.957 kg (141 lb), last menstrual period 07/29/2012, SpO2 97.00%.Body mass index is 24.20 kg/(m^2).  General Appearance: Casual  Eye Contact::  Fair  Speech:  Slow  Volume:  Decreased  Mood:  Depressed and Dysphoric  Affect:  Constricted  Thought Process:  Coherent  Orientation:  Full (Time, Place, and Person)  Thought Content:  WDL  Suicidal Thoughts:  Yes.  without intent/plan  Homicidal Thoughts:  No  Memory:  Immediate;   Fair Recent;   Fair Remote;   Fair  Judgement:  Fair  Insight:  Present  Psychomotor Activity:  Normal  Concentration:  Fair  Recall:  Fair  Akathisia:  No  Handed:  Right  AIMS (if indicated):     Assets:  Communication Skills Desire for Improvement  Sleep:  Number of Hours: 6.5    Current Medications: Current  Facility-Administered Medications  Medication Dose Route Frequency Provider Last Rate Last Dose  . acetaminophen (TYLENOL) tablet 1,000 mg  1,000 mg Oral Q6H PRN Verne Spurr, PA-C   1,000 mg at 07/29/12 2312  . albuterol (PROVENTIL HFA;VENTOLIN HFA) 108 (90 BASE) MCG/ACT inhaler 2 puff  2 puff Inhalation Q6H PRN Verne Spurr, PA-C      . ALPRAZolam Prudy Feeler) tablet 1 mg  1 mg Oral BID Babygirl Trager, MD   1 mg at 08/01/12 0802  . alum & mag hydroxide-simeth (MAALOX/MYLANTA) 200-200-20 MG/5ML suspension 30 mL  30 mL Oral Q4H PRN Verne Spurr, PA-C   30 mL at 07/30/12 2010  . divalproex (DEPAKOTE) DR tablet 1,000 mg  1,000 mg Oral QHS Verne Spurr, PA-C   1,000 mg at 07/31/12 2155  . gabapentin (NEURONTIN) capsule 400 mg  400 mg Oral TID Verne Spurr, PA-C   400 mg at 08/01/12 1203  . magnesium hydroxide (MILK OF MAGNESIA) suspension 30 mL  30 mL Oral Daily PRN Verne Spurr, PA-C   30 mL at 07/30/12 2010  . mirtazapine (REMERON) tablet 7.5 mg  7.5 mg Oral QHS Nanine Means, NP   7.5 mg at 07/31/12 2155  . nicotine (NICODERM CQ - dosed in mg/24 hours) patch 21 mg  21 mg Transdermal Q0600 Verne Spurr, PA-C   21 mg at 08/01/12 0644  . risperiDONE (RISPERDAL) tablet 4 mg  4 mg Oral QHS Verne Spurr, PA-C   4 mg at 07/31/12 2155  . sertraline (  ZOLOFT) tablet 75 mg  75 mg Oral Daily Mairin Lindsley, MD   75 mg at 08/01/12 0803  . traMADol (ULTRAM) tablet 50 mg  50 mg Oral Q6H PRN Nanine Means, NP   50 mg at 08/01/12 1610    Lab Results: No results found for this or any previous visit (from the past 48 hour(s)).  Physical Findings: AIMS: Facial and Oral Movements Muscles of Facial Expression: None, normal Lips and Perioral Area: None, normal Jaw: None, normal Tongue: None, normal,Extremity Movements Upper (arms, wrists, hands, fingers): None, normal Lower (legs, knees, ankles, toes): None, normal, Trunk Movements Neck, shoulders, hips: None, normal, Overall Severity Severity of abnormal  movements (highest score from questions above): None, normal Incapacitation due to abnormal movements: None, normal Patient's awareness of abnormal movements (rate only patient's report): No Awareness, Dental Status Current problems with teeth and/or dentures?: No Does patient usually wear dentures?: No  CIWA:  CIWA-Ar Total: 0  COWS:  COWS Total Score: 1   Treatment Plan Summary: Daily contact with patient to assess and evaluate symptoms and progress in treatment Medication management  Plan: Continue current plan of care. Continue to monitor.   Medical Decision Making Problem Points:  Established problem, stable/improving (1), Review of last therapy session (1) and Review of psycho-social stressors (1) Data Points:  Review of medication regiment & side effects (2) Review of new medications or change in dosage (2)  I certify that inpatient services furnished can reasonably be expected to improve the patient's condition.   Aman Bonet 08/01/2012, 1:06 PM

## 2012-08-01 NOTE — Progress Notes (Signed)
Adult Psychoeducational Group Note  Date:  08/01/2012 Time:  6:31 PM  Group Topic/Focus:  Rediscovering Joy:   The focus of this group is to explore various ways to relieve stress in a positive manner.  Participation Level:  Minimal  Participation Quality:  Appropriate and Attentive  Affect:  Appropriate, Depressed and Flat  Cognitive:  Appropriate  Insight: Improving  Engagement in Group:  Developing/Improving  Modes of Intervention:  Activity, Discussion and Socialization  Additional Comments:  [name] attended group and participated. Group discussion consisted of talking about how to rediscover joy. Patient stated ways that laughter and humor affect the human mind and body. Patient then expressed ways to have humor and laughter apart of their lives. Activity was apart of group, patient explained one thing enjoyable related to the five senses and how to incorporate into daily coping skills. [name] often look flat, shared helpful information in regards to her treatment.   [name] 08/01/2012, 6:31 PM

## 2012-08-01 NOTE — Progress Notes (Signed)
D:  Patient's self inventory sheet, patient sleeps well, good appetite, normal energy level, improving attention span.   Rated depression and hopelessness #6, anxiety #8.  Denied withdrawals.   SI off/on, contracts for safety.  Has experienced headaches in past 24 hours.  Zero pain goal, worst pain #8.  After discharge, not sure what to do after discharge to better care for herself.  Heard voices yesterday, like she talks to herself, denied voices this morning.  Denied HI.   Denied visual hallucinations.   No problems taking meds after discharge. A:  Medications administered per MD order.  Staff will monitor every 15 minutes for safety.   Emotional support and encouragement given patient.   R:  Patient denied SI and HI.   Denied A/V hallucinations.  Patient remains safe on unit.  Will continue to monitor for safety.

## 2012-08-02 MED ORDER — ALPRAZOLAM 1 MG PO TABS
1.0000 mg | ORAL_TABLET | Freq: Two times a day (BID) | ORAL | Status: DC
  Administered 2012-08-02 – 2012-08-05 (×6): 1 mg via ORAL
  Filled 2012-08-02 (×6): qty 1

## 2012-08-02 MED ORDER — TRAMADOL HCL 50 MG PO TABS
100.0000 mg | ORAL_TABLET | Freq: Four times a day (QID) | ORAL | Status: DC | PRN
  Administered 2012-08-02 – 2012-08-05 (×11): 100 mg via ORAL
  Filled 2012-08-02: qty 1
  Filled 2012-08-02 (×5): qty 2
  Filled 2012-08-02: qty 1
  Filled 2012-08-02 (×3): qty 2
  Filled 2012-08-02: qty 1

## 2012-08-02 NOTE — Progress Notes (Signed)
[facility] Group Notes:  (Nursing/MHT/Case Management/Adjunct)  Date:  08/02/2012  Time:  6:58 PM  Type of Therapy:  Therapeutic Activity- Recovery goals  Participation Level:  Active  Participation Quality:  Appropriate and Attentive  Affect:  Appropriate  Cognitive:  Appropriate  Insight:  Appropriate  Engagement in Group:  Developing/Improving and Engaged  Modes of Intervention:  Activity, Discussion, Education, Socialization and Support  Summary of Progress/Problems: [name] attended and participated in therapeutic activity on recovery goals. Patient stated one goal on recovery for to have when discharged, once written another group member would read the goal and the patient would explain why they chose that goal. Patient was guarded when sharing due to the resistant with sharing in a group setting. [name] state her goal for recovery is to take her medications and use coping skills.   [name] 08/02/2012, 6:58 PM

## 2012-08-02 NOTE — Progress Notes (Signed)
[facility] MD Progress Note  08/02/2012 2:02 PM [name]  MRN:  [id number] Subjective:  Back pain increased, tramadol increased (usually takes Norco at home), reports not sleeping well and per patient's request moved her xanax at 5 pm to 9 pm to assist with her anxiety and sleep.  [name] states her depression as an 8/10, 10/10 anxiety with passive suicidal thoughts, states "confusion and agitation over phone calls last night"--apparently she called her husband to tell him goodnight and she loved him--then, did it again two minutes later but does not remember doing it twice Diagnosis:   Axis I: Anxiety, post-partum and Depressive Disorder NOS Axis II: Deferred Axis III:  Past Medical History  Diagnosis Date  . Seizures   . 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 Depression   . Heart murmur   . Migraine    Axis IV: other psychosocial or environmental problems, problems related to social environment and problems with primary support group Axis V: 41-50 serious symptoms  ADL's:  Intact  Sleep: Poor  Appetite:  Fair  Suicidal Ideation:  Plan:  None Intent:  None Means:  NOne Homicidal Ideation:  Denies  Psychiatric Specialty Exam: Review of Systems  Constitutional: Negative.   HENT: Negative.   Eyes: Negative.   Respiratory: Negative.   Cardiovascular: Negative.   Gastrointestinal: Negative.   Genitourinary: Negative.   Musculoskeletal: Positive for back pain.  Skin: Negative.   Neurological: Negative.   Endo/Heme/Allergies: Negative.   Psychiatric/Behavioral: Positive for depression, suicidal ideas and hallucinations. The patient is nervous/anxious.     Blood pressure 104/75, pulse 112, temperature 98.3 F (36.8 C), temperature source Oral, resp. rate 18, height 5\' 4"  (1.626 m), weight 63.957 kg (141 lb), last menstrual period 07/29/2012, SpO2 97.00%.Body mass index is 24.20 kg/(m^2).  General Appearance: Casual  Eye Contact::  Fair  Speech:  Slow  Volume:  Decreased  Mood:  Anxious  and Depressed  Affect:  Flat  Thought Process:  Coherent  Orientation:  Full (Time, Place, and Person)  Thought Content:  Hallucinations: Visual  Suicidal Thoughts:  Yes, no intent  Homicidal Thoughts:  No  Memory:  Immediate;   Fair Recent;   Fair Remote;   Fair  Judgement:  Fair  Insight:  Fair  Psychomotor Activity:  Decreased  Concentration:  Fair  Recall:  Fair  Akathisia:  No  Handed:  Right  AIMS (if indicated):     Assets:  Communication Skills Housing Physical Health Social Support  Sleep:  Number of Hours: 4.75    Current Medications: Current Facility-Administered Medications  Medication Dose Route Frequency Provider Last Rate Last Dose  . acetaminophen (TYLENOL) tablet 1,000 mg  1,000 mg Oral Q6H PRN Verne Spurr, PA-C   1,000 mg at 07/29/12 2312  . albuterol (PROVENTIL HFA;VENTOLIN HFA) 108 (90 BASE) MCG/ACT inhaler 2 puff  2 puff Inhalation Q6H PRN Verne Spurr, PA-C      . ALPRAZolam Prudy Feeler) tablet 1 mg  1 mg Oral BID Himabindu Ravi, MD   1 mg at 08/02/12 0818  . alum & mag hydroxide-simeth (MAALOX/MYLANTA) 200-200-20 MG/5ML suspension 30 mL  30 mL Oral Q4H PRN Verne Spurr, PA-C   30 mL at 07/30/12 2010  . divalproex (DEPAKOTE) DR tablet 1,000 mg  1,000 mg Oral QHS Verne Spurr, PA-C   1,000 mg at 08/01/12 2245  . gabapentin (NEURONTIN) capsule 400 mg  400 mg Oral TID Verne Spurr, PA-C   400 mg at 08/02/12 1246  . magnesium  hydroxide (MILK OF MAGNESIA) suspension 30 mL  30 mL Oral Daily PRN Verne Spurr, PA-C   30 mL at 07/30/12 2010  . mirtazapine (REMERON) tablet 7.5 mg  7.5 mg Oral QHS Nanine Means, NP   7.5 mg at 08/01/12 2245  . nicotine (NICODERM CQ - dosed in mg/24 hours) patch 21 mg  21 mg Transdermal Q0600 Verne Spurr, PA-C   21 mg at 08/02/12 0641  . risperiDONE (RISPERDAL) tablet 4 mg  4 mg Oral QHS Verne Spurr, PA-C   4 mg at 08/01/12 2245  . sertraline (ZOLOFT) tablet 75 mg  75 mg Oral Daily Himabindu Ravi, MD   75 mg at 08/02/12 0818  .  traMADol (ULTRAM) tablet 50 mg  50 mg Oral Q6H PRN Nanine Means, NP   50 mg at 08/02/12 1106    Lab Results: No results found for this or any previous visit (from the past 48 hour(s)).  Physical Findings: AIMS: Facial and Oral Movements Muscles of Facial Expression: None, normal Lips and Perioral Area: None, normal Jaw: None, normal Tongue: None, normal,Extremity Movements Upper (arms, wrists, hands, fingers): None, normal Lower (legs, knees, ankles, toes): None, normal, Trunk Movements Neck, shoulders, hips: None, normal, Overall Severity Severity of abnormal movements (highest score from questions above): None, normal Incapacitation due to abnormal movements: None, normal Patient's awareness of abnormal movements (rate only patient's report): No Awareness, Dental Status Current problems with teeth and/or dentures?: No Does patient usually wear dentures?: No  CIWA:  CIWA-Ar Total: 0  COWS:  COWS Total Score: 0   Treatment Plan Summary: Daily contact with patient to assess and evaluate symptoms and progress in treatment Medication management  Plan:  Review of chart, vital signs, medications, and notes. 1-Individual and group therapy 2-Medication management for depression and anxiety:  Medications reviewed with the patient and she requested her 1700 Xanax be moved to 2100 (order changed) and increased her tramadol per request (she usually takes Norco at home for her back pain) 3-Coping skills for depression, anxiety, and pain 4-Continue crisis stabilization and management 5-Address health issues--monitoring pain, attending groups with participation--no grimacing/bracing/guarding noted, flat affect, vital signs stable with one increase in pulse noted--will re-evaluate manually 6-Treatment plan in progress to prevent relapse of depression and anxiety  Medical Decision Making Problem Points:  Established problem, worsening (2) and Review of psycho-social stressors (1) Data Points:   Review of new medications or change in dosage (2)  I certify that inpatient services furnished can reasonably be expected to improve the patient's condition.   Nanine Means, PMH-NP 08/02/2012, 2:02 PM

## 2012-08-02 NOTE — Tx Team (Signed)
Interdisciplinary Treatment Plan Update (Adult)  Date:  08/02/2012  Time Reviewed:  9:54 AM   Progress in Treatment: Attending groups:   Yes   Participating in groups:  Yes Taking medication as prescribed:  Yes Tolerating medication:  Yes Family/Significant othe contact made: Contact to be made with family Patient understands diagnosis:  Yes Discussing patient identified problems/goals with staff: Yes Medical problems stabilized or resolved: Yes Denies suicidal/homicidal ideation: No, but contracts for safety Issues/concerns per patient self-inventory:  Other:   New problem(s) identified:  Reason for Continuation of Hospitalization: Anxiety Depression Medication stabilization Suicidal ideation  Interventions implemented related to continuation of hospitalization:  Medication Management; safety checks q 15 mins  Additional comments:  Estimated length of stay:  2-3 days  Discharge Plan:  Home with outpatient follow up  New goal(s):  Review of initial/current patient goals per problem list:    1.  Goal(s): Eliminate SI/other thoughts of self harm (Patient will no longer endorse SI/HI or thoughts of self harm)   Met:  No  Target date: d/c  As evidenced by: Patient continues to endorses SI but contracts for safety    2.  Goal (s):Reduce depression/anxiety (Paitent will rate symptoms at four or below)  Met: No  Target date: d/c  As evidenced by: Patient rates symptoms at eight today  3.  Goal(s):.stabilize on meds (Patient will report being stabilized on medications - less symptomatic   Met:  No  Target date: d/c  As evidenced by: Patient continues to endorse symptoms    4.  Goal(s): Refer for outpatient follow up (Follow up appointment will be scheduled)   Met:  No  Target date: d/c  As evidenced by: Follow up appointment has not scheduled    Attendees: Patient:   08/02/2012 9:54 AM  Physican:  Patrick North, MD 08/02/2012 9:54 AM  Nursing:     Harold Barban, RN 08/02/2012 9:54 AM   Nursing:    Lamount Cranker, RN 08/02/2012 9:54 AM   Clinical Social Worker:  Juline Patch, LCSW 08/02/2012 9:54 AM   Other: Tera Helper, PHM-NP 08/02/2012 9:54 AM   Other:         08/02/2012 9:54 AM Other:        08/02/2012 9:54 AM

## 2012-08-02 NOTE — Progress Notes (Signed)
Adult Psychoeducational Group Note  Date:  08/02/2012 Time:  12:21 PM  Group Topic/Focus:  Relapse Prevention Planning:   The focus of this group is to define relapse and discuss the need for planning to combat relapse.  Participation Level:  Active  Participation Quality:  Attentive  Affect:  Appropriate  Cognitive:  Oriented  Insight: Good  Engagement in Group:  Engaged  Modes of Intervention:  Discussion and Education  Additional Comments:  Pt said she wants to work on her PTSD, depression, and anxiety symptoms while in the hospital. Pt participated in group and wants to remain positive about expectations and outcomes.  Asaiah Hunnicutt T 08/02/2012, 12:21 PM

## 2012-08-02 NOTE — Progress Notes (Signed)
D) Pt has attended the groups and interacts with her peers appropriately. Admits to thoughts of suicide on and off and rates her depression at an 8 and her hopelessness at a 6. Handed this Clinical research associate a picture that she had drawn to be placed in the med room and was proud of it when it was hung up. Affect is flat with depressed mood. States she feels very sad and just doesn't know where she is going to go or what she is going to do. A) Given support and provided with active listening. Encouraged to journal and to attend the program. R) Contracts for her safety on the unit.

## 2012-08-02 NOTE — Progress Notes (Signed)
[facility] LCSW Aftercare Discharge Planning Group Note  08/02/2012 2:53 PM  Participation Quality:  Appropriate  Affect:  Appropriate, Depressed and Flat  Cognitive:  Alert and Appropriate  Insight:  Engaged  Engagement in Group:  Engaged  Modes of Intervention:  Exploration, Problem-solving, Rapport Building and Support  Summary of Progress/Problems:  Patient continues to endorse SI but contracts for safety. Patient shared she is agitated today due to calling husband twice and not remembering she had called.  She also shared she has been talking to someone who is not there.  She rates depression and at eight adn anxiety at ten.   [name] 08/02/2012, 2:53 PM

## 2012-08-02 NOTE — Progress Notes (Signed)
[facility] LCSW Group Therapy        Feelings Around Relapse        1:15-2:30 PM    08/02/2012 2:54 PM  Type of Therapy:  Group Therapy  Participation Level:  Minimal  Participation Quality:  Drowsy  Affect:  Appropriate  Cognitive:  Appropriate  Insight:  Engaged  Engagement in Therapy:  Limited  Modes of Intervention:  Discussion, Education, Exploration, Rapport Building and Support  Summary of Progress/Problems:  Patient was very drowsy during group. She shared she tends to relapse when she becomes confused and can not remember what she is doing. She shared she has spent time color today in an effort to remain focused.   [name] 08/02/2012, 2:54 PM

## 2012-08-03 DIAGNOSIS — F4310 Post-traumatic stress disorder, unspecified: Secondary | ICD-10-CM

## 2012-08-03 MED ORDER — MINERAL OIL PO OIL
TOPICAL_OIL | Freq: Every day | ORAL | Status: DC | PRN
  Administered 2012-08-03: 18:00:00 via ORAL
  Filled 2012-08-03 (×2): qty 30

## 2012-08-03 MED ORDER — FLUOXETINE HCL 20 MG PO CAPS
20.0000 mg | ORAL_CAPSULE | Freq: Every day | ORAL | Status: DC
  Administered 2012-08-03 – 2012-08-05 (×3): 20 mg via ORAL
  Filled 2012-08-03: qty 1
  Filled 2012-08-03: qty 14
  Filled 2012-08-03 (×4): qty 1

## 2012-08-03 NOTE — Progress Notes (Signed)
[facility] Group Notes:  (Nursing/MHT/Case Management/Adjunct)  Date:  08/02/2012 Time:  2000  Type of Therapy:  Psychoeducational Skills  Participation Level:  Active  Participation Quality:  Attentive  Affect:  Flat  Cognitive:  Appropriate  Insight:  Improving  Engagement in Group:  Limited  Modes of Intervention:  Education  Summary of Progress/Problems: The patient verbalized in group that she woke up this morning feeling agitated. She could not explain why she felt that way. In addition, she mentioned that she slept poorly last evening. The patient eventually went downstairs to the gym with her peers. Her goal for tomorrow is to get more sleep and to feel less agitated.   Nastasha Reising S 08/03/2012, 5:09 AM

## 2012-08-03 NOTE — Progress Notes (Signed)
D) Pt has attended the groups and interacts with select peers. Affect is flat and mood remains depressed. Rates her depression and hopelessness both at a 9 and states that she feels suicidial constantly. Will complain about the pain in her back and will ask for her Ultram every 8 hours as ordered. Also having difficulty with an increased amount of gas and has verbalized feelings of embarrassment. Needs frequent contact from staff. A) Given support, reassurance and praise. Pt received prune juice without results. Order obtained for mineral oil for constipation and distension.  R) Pt remains depressed and continues to feel helpless and hopeless.

## 2012-08-03 NOTE — Progress Notes (Signed)
  GOALS GROUP  The focus of this group is to help patients establish daily goals to achieve during treatment and discuss how the patient can incorporate goal setting into their daily lives to aide in recovery.   Date: 08/03/2012 Time:  0900  Participation Level:  Active  Participation Quality:  Attentive  Affect:  Appropriate  Cognitive:  Appropriate  Insight:  Engaged  Engagement in Group:  Engaged  Additional Comments:    Mikya Don A 
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
## 2012-08-03 NOTE — Clinical Social Work Note (Signed)
[facility] Group Notes:  (Clinical Social Work)  08/03/2012   3:00-4:00PM  Summary of Progress/Problems:   The main focus of today's process group was for the patient to identify ways in which they have in the past sabotaged their own recovery and to discuss their motivation to change, as well as confidence that they can change. Cognitive Behavioral Therapy concepts about the interconnectedness of thoughts/feelings/actions was introduced, along with 2 short techniques of Thought Stopping and Thought Replacement.  The patient expressed that she is suicidal and that she tells herself fairly frequently "I don't want to live."  She was able to think about replacement thoughts such as "Your children need you" or "Think about the children."  She stated her motivation is very high, 9 out of 10 because she knows she needs to work at wanting to live.  Type of Therapy:  Process Group, Cognitive Behavior Therapy, Motivational Interviewing  Participation Level:  Active  Participation Quality:  Attentive and Sharing  Affect:  Depressed and Flat  Cognitive:  Oriented  Insight:  Developing/Improving  Engagement in Therapy:  Developing/Improving  Modes of Intervention:  Clarification, Education, Limit-setting, Problem-solving, Socialization, Support and Processing, Exploration, Discussion   Ambrose Mantle, LCSW 08/03/2012, 5:40 PM

## 2012-08-03 NOTE — Progress Notes (Signed)
Psychoeducational Group Note  Date: 08/03/2012 Time:  1015  Group Topic/Focus:  Identifying Needs:   The focus of this group is to help patients identify their personal needs that have been historically problematic and identify healthy behaviors to address their needs.  Participation Level:  Active  Participation Quality:  Drowsy  Affect:  Blunted  Cognitive:  Oriented  Insight:  Limited  Engagement in Group:  Limited  Additional Comments:    Dione Housekeeper

## 2012-08-04 NOTE — Progress Notes (Signed)
Writer spoke with patient at medication window 1:1 and patient c/o lower back pain and requested a prn of ultram for pain she reported an 8. Patient has been observed in the dayroom playing cards with select peers. Patient reports passive si and verbally contracts for safety. Patient reports auditory hallucinations and states that she had a conversation with her husband and he was not even there. Patient offered support and encouragement, safety maintained on unit, will continue to monitor.

## 2012-08-04 NOTE — Progress Notes (Signed)
The patient recently requested to have a cup of ice and fluids be brought to her room. The patient was observed sitting in a chair and working on a word search. After following through with the patient's request, this author encouraged the patient to try to lay down and rest. The patient politely refused, stating that she had already rested for "a couple of hours" and that what she needed was an additional dosage of Ultram along with a hot shower. The patient has thus far slept for two and one half hours.

## 2012-08-04 NOTE — Progress Notes (Signed)
Psychoeducational Group Note  Date:  08/04/2012 Time:  1015  Group Topic/Focus:  Making Healthy Choices:   The focus of this group is to help patients identify negative/unhealthy choices they were using prior to admission and identify positive/healthier coping strategies to replace them upon discharge.  Participation Level:  Active  Participation Quality:  Appropriate  Affect:  Appropriate  Cognitive:  Appropriate  Insight:  Improving  Engagement in Group:  Improving  Additional Comments:    [name] A 08/04/2012 

## 2012-08-04 NOTE — Progress Notes (Signed)
D) Pt rates her depression and her hopelessness both at a 5. Admits to SI on and off. States she did not sleep last night and has been sitting in groups falling asleep. After lunch Pt went back to bed and stated that she needed to sleep and that she was not going to sit up and fall asleep in group. Although Pt wanted to know what time she was able to get her pain medications. A) Given support. Encouraged to stay out of the bed and to attend the groups so that she could have the chance at a good sleep this evening. Praise when attending the morning groups and being helped to stay awake. R) Admits to thoughts of SI on and off. Feeling tired.

## 2012-08-04 NOTE — Clinical Social Work Note (Signed)
[facility] Group Notes:  (Clinical Social Work)  08/04/2012   3:00-4:00PM  Summary of Progress/Problems:   Summary of Progress/Problems:   The main focus of today's process group was to define "support" and describe what healthy supports are.  We then discussed how and why to increase patient supports, using motivational interviewing.  An emphasis was placed on using counselor, doctor, therapy groups, self-help groups and problem-specific support groups to expand supports.   The patient came to group quite late and did her best to join the topic.  She was able to complete the handout and stated she sees from the handout that she needs to add more supports.  She is particularly interested in going to [facility] therapy groups as well as possibly some other support groups.  Type of Therapy:  Process Group  Participation Level:  Minimal  Participation Quality:  Drowsy and Sharing  Affect:  Blunted and Depressed  Cognitive:  Oriented  Insight:  Developing/Improving  Engagement in Therapy:  Developing/Improving  Modes of Intervention:  Education,  Support and Processing, Exploration, Discussion   Ambrose Mantle, LCSW 08/04/2012, 5:04 PM

## 2012-08-04 NOTE — Progress Notes (Signed)
Patient has been up and active, reports that she rewashed her clothes and her rash she had been experiencing has gone away and feels that it was the detergent. Patient was informed of an order for I&O and got measuring hat for commode and informed that intake and output of fluids will be recorded. Patient feels that she is retaining fluids. Patient reports passive si and verbally contracts for safety. -hi/visual hallucinations, still has visual hallucinations. Support and encouragement provided, safety maintained on unit, will continue to monitor.

## 2012-08-04 NOTE — Progress Notes (Signed)
Patient ID: [name], female   DOB: 07-18-1970, 42 y.o.   MRN: [id number] [facility] MD Progress Note  08/04/2012 9:59 AM [name]  MRN:  [id number]  Subjective:  Looks better affect is brighter. Says she didn't feel well due to back pain. Depakote is still sub therapeutic at 30 . Asks for Percocet 10/325 to be restarted instead of Ultram.Also asks for a fluid pill. Feels she is retaining fluid and says that she is having trouble urinating. Is also eating an unusual amount compared to home. Does not report re-experiencing avoidance and is not hypervigilent.  Diagnosis:   Axis I: PTSD  Axis II: Deferred Axis III:  Past Medical History  Diagnosis Date  . Seizures   . 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 Depression   . Heart murmur   . Migraine    Axis IV: other psychosocial or environmental problems, problems related to social environment and problems with primary support group Axis V: 41-50 serious symptoms  ADL's:  Intact  Sleep: Poor  Appetite:  Fair  Suicidal Ideation:  Plan:  None Intent:  None Means:  NOne Homicidal Ideation:  Denies  Psychiatric Specialty Exam: Review of Systems  Constitutional: Negative.   HENT: Negative.   Eyes: Negative.   Respiratory: Negative.   Cardiovascular: Negative.   Gastrointestinal: Negative.   Genitourinary: Negative.   Musculoskeletal: Positive for back pain.  Skin: Negative.   Neurological: Negative.   Endo/Heme/Allergies: Negative.   Psychiatric/Behavioral: Positive for depression, suicidal ideas and hallucinations. The patient is nervous/anxious.     Blood pressure 108/73, pulse 93, temperature 97.2 F (36.2 C), temperature source Oral, resp. rate 16, height 5\' 4"  (1.626 m), weight 63.957 kg (141 lb), last menstrual period 07/29/2012, SpO2 97.00%.Body mass index is 24.19 kg/(m^2).  General Appearance: Casual  Eye Contact::  Fair  Speech:  Slow  Volume:  Decreased  Mood:  Anxious and Depressed  Affect:  Flat  Thought Process:   Coherent  Orientation:  Full (Time, Place, and Person)  Thought Content:  Hallucinations: Visual  Suicidal Thoughts:  Yes, no intent  Homicidal Thoughts:  No  Memory:  Immediate;   Fair Recent;   Fair Remote;   Fair  Judgement:  Fair  Insight:  Fair  Psychomotor Activity:  Decreased  Concentration:  Fair  Recall:  Fair  Akathisia:  No  Handed:  Right  AIMS (if indicated):     Assets:  Communication Skills Housing Physical Health Social Support  Sleep:  Number of Hours: 2   Current Medications: Current Facility-Administered Medications  Medication Dose Route Frequency Provider Last Rate Last Dose  . acetaminophen (TYLENOL) tablet 1,000 mg  1,000 mg Oral Q6H PRN Verne Spurr, PA-C   1,000 mg at 07/29/12 2312  . albuterol (PROVENTIL HFA;VENTOLIN HFA) 108 (90 BASE) MCG/ACT inhaler 2 puff  2 puff Inhalation Q6H PRN Verne Spurr, PA-C      . ALPRAZolam Prudy Feeler) tablet 1 mg  1 mg Oral BID Nanine Means, NP   1 mg at 08/04/12 0827  . alum & mag hydroxide-simeth (MAALOX/MYLANTA) 200-200-20 MG/5ML suspension 30 mL  30 mL Oral Q4H PRN Verne Spurr, PA-C   30 mL at 08/02/12 1930  . divalproex (DEPAKOTE) DR tablet 1,000 mg  1,000 mg Oral QHS Verne Spurr, PA-C   1,000 mg at 08/03/12 2243  . FLUoxetine (PROZAC) capsule 20 mg  20 mg Oral Daily Fredrik Cove, PA-C   20 mg at 08/04/12 0827  . gabapentin (  NEURONTIN) capsule 400 mg  400 mg Oral TID Verne Spurr, PA-C   400 mg at 08/04/12 1610  . magnesium hydroxide (MILK OF MAGNESIA) suspension 30 mL  30 mL Oral Daily PRN Verne Spurr, PA-C   30 mL at 08/02/12 1929  . mineral oil liquid   Oral Daily PRN Fredrik Cove, PA-C      . mirtazapine (REMERON) tablet 7.5 mg  7.5 mg Oral QHS Nanine Means, NP   7.5 mg at 08/03/12 2243  . nicotine (NICODERM CQ - dosed in mg/24 hours) patch 21 mg  21 mg Transdermal Q0600 Verne Spurr, PA-C   21 mg at 08/04/12 0616  . risperiDONE (RISPERDAL) tablet 4 mg  4 mg Oral QHS Verne Spurr, PA-C   4 mg at  08/03/12 2243  . traMADol (ULTRAM) tablet 100 mg  100 mg Oral Q6H PRN Nanine Means, NP   100 mg at 08/04/12 9604    Lab Results:  Results for orders placed during the hospital encounter of 07/29/12 (from the past 48 hour(s))  VALPROIC ACID LEVEL     Status: Abnormal   Collection Time    08/03/12  7:20 PM      Result Value Range   Valproic Acid Lvl 30.8 (*) 50.0 - 100.0 ug/mL    Physical Findings: AIMS: Facial and Oral Movements Muscles of Facial Expression: None, normal Lips and Perioral Area: None, normal Jaw: None, normal Tongue: None, normal,Extremity Movements Upper (arms, wrists, hands, fingers): None, normal Lower (legs, knees, ankles, toes): None, normal, Trunk Movements Neck, shoulders, hips: None, normal, Overall Severity Severity of abnormal movements (highest score from questions above): None, normal Incapacitation due to abnormal movements: None, normal Patient's awareness of abnormal movements (rate only patient's report): No Awareness, Dental Status Current problems with teeth and/or dentures?: No Does patient usually wear dentures?: No  CIWA:  CIWA-Ar Total: 0 COWS:  COWS Total Score: 0  Treatment Plan Summary: Daily contact with patient to assess and evaluate symptoms and progress in treatment Medication management  Plan:  Review of chart, vital signs, medications, and notes. 1-Individual and group therapy 2-Medication management for depression and anxiety:  Medications reviewed with the patient and she requested her 1700 Xanax be moved to 2100 (order changed) and increased her tramadol per request (she usually takes Norco at home for her back pain) 3-Coping skills for depression, anxiety, and pain 4-Continue crisis stabilization and management 5-Address health issues--monitoring pain, attending groups with participation--no grimacing/bracing/guarding noted, flat affect, vital signs stable with one increase in pulse noted--will re-evaluate manually 6-Treatment  plan in progress to prevent relapse of depression and anxiety  Medical Decision Making Problem Points:  Established problem, worsening (2) and Review of psycho-social stressors (1) Data Points:  Review of new medications or change in dosage (2)  I certify that inpatient services furnished can reasonably be expected to improve the patient's condition.   Marrie Chandra,MICKIE D. RPA-C CAQ-Psych  08/04/2012, 9:59 AM

## 2012-08-04 NOTE — Progress Notes (Signed)
Psychoeducational Group Note  Date:  08/04/2012 Time:  1015  Group Topic/Focus:  Making Healthy Choices:   The focus of this group is to help patients identify negative/unhealthy choices they were using prior to admission and identify positive/healthier coping strategies to replace them upon discharge.  Participation Level:  Active  Participation Quality:  Appropriate  Affect:  Appropriate  Cognitive:  Appropriate  Insight:  Improving  Engagement in Group:  Improving  Additional Comments:    Curby Carswell A 08/04/2012 

## 2012-08-05 DIAGNOSIS — F411 Generalized anxiety disorder: Secondary | ICD-10-CM

## 2012-08-05 MED ORDER — MIRTAZAPINE 7.5 MG PO TABS: 7.5000 mg | ORAL_TABLET | Freq: Every day | ORAL | Status: DC

## 2012-08-05 MED ORDER — TRAMADOL HCL 50 MG PO TABS: 100.0000 mg | ORAL_TABLET | Freq: Four times a day (QID) | ORAL | Status: DC | PRN

## 2012-08-05 MED ORDER — DIVALPROEX SODIUM 500 MG PO DR TAB: 1000.0000 mg | DELAYED_RELEASE_TABLET | Freq: Every day | ORAL | Status: DC

## 2012-08-05 MED ORDER — RISPERIDONE 4 MG PO TABS: 4.0000 mg | ORAL_TABLET | Freq: Every day | ORAL | Status: DC

## 2012-08-05 MED ORDER — FLUOXETINE HCL 20 MG PO CAPS: 20.0000 mg | ORAL_CAPSULE | Freq: Every day | ORAL | Status: DC

## 2012-08-05 MED ORDER — GABAPENTIN 400 MG PO CAPS: 400.0000 mg | ORAL_CAPSULE | Freq: Three times a day (TID) | ORAL | Status: DC

## 2012-08-05 NOTE — Progress Notes (Signed)
D:  Patient's self inventory sheet, patient sleeps well, has good appetite, normal energy level, good attention span.  Rated depression and hopelessness #3.  Denied withdrawals.  Denied SI, contracts for safety.  Has experienced back pain in past 24 hours.  Zero pain goal, worst pain #7.  "I plan on staying on my meds and keeping my Dr. Alfonzo Beers.  I using my coping skills.  I ready to go home, living in [facility], moving there with family."  Stated she sleeps good and has been eating good.  Does have discharge plans.  No problems taking medications after discharge. A:  Medications administered per MD order.  Staff will continue to monitor every 15 minutes for safety.  Emotional support and encouragement given to patient.   R:  Patient remains safe on unit.  Denied SI and HI.   Denied A/V hallucinations.    Patient has drank 2,144 cc fluid since 0700.  Urine 2, 070 output.

## 2012-08-05 NOTE — Progress Notes (Signed)
Discharge Note:  Patient's family picked her up to go home.   Denied SI and HI.   Denied A/V hallucinations.  Denied pain.   Stated she is feeling much better since she is going home.  Received all her belongings, prescriptions, medications, etc.  Suicide prevention information given to patient, discussed with patient, who stated she understood and had no questions.  Patient stated she appreciated all the staff has done to assist her while at [facility].

## 2012-08-05 NOTE — Discharge Summary (Signed)
Physician Discharge Summary Note  Patient:  [name] is an 42 y.o., female MRN:  [id number] DOB:  08-07-1970 Patient phone:  [phone] (home)  Patient address:   [name]  [place] [address],   Date of Admission:  07/29/2012 Date of Discharge: 08/05/2012  Reason for Admission:  Depression with suicidal thoughts  Discharge Diagnoses: Active Problems:   Depression   Anxiety  Review of Systems  Constitutional: Negative.   HENT: Negative.   Eyes: Negative.   Respiratory: Negative.   Cardiovascular: Negative.   Gastrointestinal: Negative.   Genitourinary: Negative.   Musculoskeletal: Negative.   Skin: Negative.   Neurological: Negative.   Endo/Heme/Allergies: Negative.   Psychiatric/Behavioral: Positive for depression. The patient is nervous/anxious.    Axis Diagnosis:   AXIS I:  Anxiety Disorder NOS and Depressive Disorder NOS AXIS II:  Deferred AXIS III:   Past Medical History  Diagnosis Date  . Seizures   . 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 Depression   . Heart murmur   . Migraine    AXIS IV:  economic problems, housing problems, occupational problems, other psychosocial or environmental problems, problems related to social environment and problems with primary support group AXIS V:  61-70 mild symptoms  Level of Care:  OP  Hospital Course: Reviewed chart, vital signs, medications, and notes. 1-Admitted for crisis management and stabilization, completed 2-Individual and group therapy attended with participation 3-Medication management for depression and anxiety to reduce current symptoms to base line and improve the patient's overall level of functioning:  Medications reviewed with the patient and she stated no untoward effects 4-Coping skills for depression and anxiety developed and utilized 5-Addressed health issues--stable 6-Treatment plan in place to prevent relapse of depression and anxiety 7-Psychosocial education regarding relapse prevention and  self-care completed 8-Patient denied suicidal/homicidal ideations and auditory/visual hallucinations, follow-up appointments encouraged to attend, patient will discharge and go live with her mother in [place], she will continue her care at the mental health facility that her sister goes to   Consults:  None  Significant Diagnostic Studies:  labs: Completed and reviewed, stable  Discharge Vitals:   Blood pressure 99/65, pulse 93, temperature 98.4 F (36.9 C), temperature source Oral, resp. rate 18, height 5\' 4"  (1.626 m), weight 70.761 kg (156 lb), last menstrual period 07/29/2012, SpO2 97.00%. Body mass index is 26.76 kg/(m^2). Lab Results:   Results for orders placed during the hospital encounter of 07/29/12 (from the past 72 hour(s))  VALPROIC ACID LEVEL     Status: Abnormal   Collection Time    08/03/12  7:20 PM      Result Value Range   Valproic Acid Lvl 30.8 (*) 50.0 - 100.0 ug/mL    Physical Findings: AIMS: Facial and Oral Movements Muscles of Facial Expression: None, normal Lips and Perioral Area: None, normal Jaw: None, normal Tongue: None, normal,Extremity Movements Upper (arms, wrists, hands, fingers): None, normal Lower (legs, knees, ankles, toes): None, normal, Trunk Movements Neck, shoulders, hips: None, normal, Overall Severity Severity of abnormal movements (highest score from questions above): None, normal Incapacitation due to abnormal movements: None, normal Patient's awareness of abnormal movements (rate only patient's report): No Awareness, Dental Status Current problems with teeth and/or dentures?: No Does patient usually wear dentures?: No  CIWA:  CIWA-Ar Total: 0 COWS:  COWS Total Score: 0  Psychiatric Specialty Exam: See Psychiatric Specialty Exam and Suicide Risk Assessment completed by Attending Physician prior to discharge.  Discharge destination:  Home  Is patient on multiple antipsychotic therapies at discharge:  No   Has Patient had three  or more failed trials of antipsychotic monotherapy by history:  No Recommended Plan for Multiple Antipsychotic Therapies:  N/A  Discharge Orders   Future Orders Complete By Expires     Activity as tolerated - No restrictions  As directed     Diet - low sodium heart healthy  As directed         Medication List    STOP taking these medications       amoxicillin 500 MG capsule  Commonly known as:  AMOXIL     ARIPiprazole 2 MG tablet  Commonly known as:  ABILIFY     lamoTRIgine 25 MG tablet  Commonly known as:  LAMICTAL     moxifloxacin 400 MG tablet  Commonly known as:  AVELOX     multivitamin with minerals Tabs     predniSONE 10 MG tablet  Commonly known as:  DELTASONE     sertraline 50 MG tablet  Commonly known as:  ZOLOFT     traZODone 150 MG tablet  Commonly known as:  DESYREL      TAKE these medications     Indication   acetaminophen 500 MG tablet  Commonly known as:  TYLENOL  Take 1,000 mg by mouth every 6 (six) hours as needed. For pain      albuterol 108 (90 BASE) MCG/ACT inhaler  Commonly known as:  PROVENTIL HFA;VENTOLIN HFA  Inhale 2 puffs into the lungs every 6 (six) hours as needed. Shortness of Breath      ALPRAZolam 1 MG tablet  Commonly known as:  XANAX  Take 1 mg by mouth 3 (three) times daily. For anxiety      divalproex 500 MG DR tablet  Commonly known as:  DEPAKOTE  Take 2 tablets (1,000 mg total) by mouth at bedtime.   Indication:  mood stabilization     EXCEDRIN MIGRAINE 250-250-65 MG per tablet  Generic drug:  aspirin-acetaminophen-caffeine  Take 2 tablets by mouth once as needed. For migraine      FLUoxetine 20 MG capsule  Commonly known as:  PROZAC  Take 1 capsule (20 mg total) by mouth daily.   Indication:  Depression     gabapentin 400 MG capsule  Commonly known as:  NEURONTIN  Take 1 capsule (400 mg total) by mouth 3 (three) times daily.   Indication:  Neuropathic Pain     mirtazapine 7.5 MG tablet  Commonly known as:   REMERON  Take 1 tablet (7.5 mg total) by mouth at bedtime.   Indication:  Trouble Sleeping     oxyCODONE-acetaminophen 10-325 MG per tablet  Commonly known as:  PERCOCET  Take 1 tablet by mouth every 4 (four) hours as needed. Pain      risperidone 4 MG tablet  Commonly known as:  RISPERDAL  Take 1 tablet (4 mg total) by mouth at bedtime.   Indication:  Easily Angered or Annoyed, mood stability         Follow-up recommendations:  Activity:  As tolerated Diet:  Low-sodium heart healthy diet  Comments:  Patient will follow-up in [facility] where her sister goes for care  Total Discharge Time:  Greater than 30 minutes.  SignedNanine Means, PMH-NP 08/05/2012, 11:05 AM

## 2012-08-05 NOTE — Progress Notes (Signed)
Adult Psychoeducational Group Note  Date:  08/05/2012 Time:  3:24 PM  Group Topic/Focus:  Self Care:   The focus of this group is to help patients understand the importance of self-care in order to improve or restore emotional, physical, spiritual, interpersonal, and financial health.  Participation Level:  Active  Participation Quality:  Appropriate and Attentive  Affect:  Appropriate  Cognitive:  Alert and Appropriate  Insight: Appropriate  Engagement in Group:  Engaged  Modes of Intervention:  Discussion  Additional Comments:  Pt was appropriate and attentive while attending group. Pt shared that she needs to improve making time away from phones, e-mail, and Internet. Pt also stated that she needed to take some vacations to help with her self-care.   Sharyn Lull 08/05/2012, 3:24 PM

## 2012-08-05 NOTE — Progress Notes (Signed)
[facility] Group Notes:  (Nursing/MHT/Case Management/Adjunct)  Date:  08/04/2012 Time:  2000  Type of Therapy:  Psychoeducational Skills  Participation Level:  Minimal  Participation Quality:  Inattentive  Affect:  Depressed  Cognitive:  Appropriate  Insight:  Good  Engagement in Group:  Limited  Modes of Intervention:  Education  Summary of Progress/Problems: The patient was initially slow to respond, but then opened up. She mentioned that she didn't sleep well last night and therefore took a nap during the daytime. She then expressed her disappointment with her husband. The patient verbalized that her husband was at home with a group of people that drink alcohol and use crack. In addition, she mentioned that her husband invited additional people to live in their trailer. Her goal for tomorrow is to arrange for transportation to [facility] where she intends to live with her mother.   Hazle Coca S 08/05/2012, 12:39 AM

## 2012-08-05 NOTE — Clinical Social Work Note (Addendum)
[facility] LCSW Aftercare Discharge Planning Group Note  08/05/2012 8:45 AM  Participation Quality:  Appropriate and Attentive  Affect:  Appropriate, Irritable  Cognitive:  Alert and Appropriate  Insight:  Developing/Improving  Engagement in Group:  Developing/Improving  Modes of Intervention:  Clarification, Discussion, Education, Exploration, Orientation, Problem-solving, Rapport Building, Socialization and Support  Summary of Progress/Problems: Pt attended discharge planning group and actively participated in group.  CSW provided pt with today's workbook.  Pt rates depression and anxiety at a 1 today.  Pt denies SI.  Pt states that she is going to [facility] to be with her mom next week. Pt is eager to d/c today.  No follow up in place; CSW will assess for appropriate referrals.  No further needs voiced by pt at this time.     Reyes Ivan, LCSWA 08/05/2012 9:49 AM

## 2012-08-05 NOTE — [facility] Suicide Risk Assessment (Signed)
Suicide Risk Assessment  Discharge Assessment     Demographic Factors:  Female, caucasian, married  Mental Status Per Nursing Assessment::   On Admission:  Suicidal ideation indicated by patient  Current Mental Status by Physician: Patient alert and oriented to 4. Denies AH/VH/SI/HI.  Loss Factors: Decrease in vocational status and Financial problems/change in socioeconomic status  Historical Factors: Impulsivity  Risk Reduction Factors:   Living with another person, especially a relative and Positive social support  Continued Clinical Symptoms:  Depression:   Recent sense of peace/wellbeing  Cognitive Features That Contribute To Risk:  Cognitively intact  Suicide Risk:  Minimal: No identifiable suicidal ideation.  Patients presenting with no risk factors but with morbid ruminations; may be classified as minimal risk based on the severity of the depressive symptoms  Discharge Diagnoses:   AXIS I:  Depressive Disorder NOS AXIS II:  Deferred AXIS III:   Past Medical History  Diagnosis Date  . Seizures   . 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 Depression   . Heart murmur   . Migraine    AXIS IV:  housing problems and other psychosocial or environmental problems AXIS V:  61-70 mild symptoms  Plan Of Care/Follow-up recommendations:  Activity:  Regular Diet:  Regular Follow up with appointments in [facility].  Is patient on multiple antipsychotic therapies at discharge:  No   Has Patient had three or more failed trials of antipsychotic monotherapy by history:  No  Recommended Plan for Multiple Antipsychotic Therapies: NA [name] 08/05/2012, 9:31 AM

## 2012-08-05 NOTE — Progress Notes (Signed)
[facility] LCSW Group Therapy  08/05/2012 2:15pm  Type of Therapy:  Group Therapy  Participation Level:  Minimal  Participation Quality:  Drowsy  Affect:  Appropriate  Cognitive:  Alert and Oriented  Insight:  Developing/Improving  Engagement in Therapy:  Developing/Improving  Modes of Intervention:  Activity, Discussion, Socialization and Support  Summary of Progress/Problems: Today's group focused on the topic of "Overcoming Obstacles". Patients were encouraged to explore what they see as obstacles to their own wellness and recovery. Patient was challenged to identify changes they are motivated to make in order to overcome their obstacles. Patient was observed to be drowsy during group session and was seen to fall asleep after she provided her personal obstacle. Patient stated that she appreciates when others say that they are sorry because they are acknowledging wrongdoing. CSW was unsure is patient understood the concept of today's group due to her drowsiness and inability to remain alert during discussion.  Haskel Khan 08/05/2012, 6:36 PM

## 2012-08-05 NOTE — Progress Notes (Signed)
[facility] INPATIENT:  Family/Significant Other Suicide Prevention Education  Suicide Prevention Education:  Education Completed;Jimmy Yaney, husband, [phone], has been identified by the patient as the family member/significant other with whom the patient will be residing, and identified as the person(s) who will aid the patient in the event of a mental health crisis (suicidal ideations/suicide attempt).  With written consent from the patient, the family member/significant other has been provided the following suicide prevention education, prior to the and/or following the discharge of the patient.  The suicide prevention education provided includes the following:  Suicide risk factors  Suicide prevention and interventions  National Suicide Hotline telephone number  [facility] assessment telephone number  [facility] Emergency Assistance 911  [facility] and/or Residential Mobile Crisis Unit telephone number  Request made of family/significant other to:  Remove weapons (e.g., guns, rifles, knives), all items previously/currently identified as safety concern.    Remove drugs/medications (over-the-counter, prescriptions, illicit drugs), all items previously/currently identified as a safety concern.  The family member/significant other verbalizes understanding of the suicide prevention education information provided.  The family member/significant other agrees to remove the items of safety concern listed above.  Daryel Gerald B 08/05/2012, 12:45 PM

## 2012-08-05 NOTE — Progress Notes (Signed)
Recreation Therapy Notes  Date: 02.10.2014 Time: 3:00pm      Group Topic/Focus: AAA  Participation Level: Active  Participation Quality: Appropriate  Affect: Appropriate  Cognitive: Appropriate   Additional Comments: Patient pet and visited with Abbey the dog.  [name]  [name], LRT/CTRS 

## 2012-08-05 NOTE — Progress Notes (Signed)
[facility] Adult Case Management Discharge Plan :  Will you be returning to the same living situation after discharge: No. At discharge, do you have transportation home?:Yes,  family Do you have the ability to pay for your medications:Yes,  Mental health  Release of information consent forms completed and in the chart;  Patient's signature needed at discharge.  Patient to Follow up at: Follow-up Information   Follow up with [facility] On 08/13/2012. (Come at 12:30 to fill out paperwork before your 1:00 appointment with Marella Bile)    Contact information:   [address]  [place]  [phone]      Patient denies SI/HI:   Yes,  yes    Safety Planning and Suicide Prevention discussed:  Yes,  yes  Ida Rogue 08/05/2012, 11:08 AM

## 2012-08-06 NOTE — Discharge Summary (Signed)
Reviewed

## 2012-08-06 NOTE — Progress Notes (Signed)
Reviewed

## 2012-08-07 ENCOUNTER — Inpatient Hospital Stay (HOSPITAL_COMMUNITY)
Admission: RE | Admit: 2012-08-07 | Discharge: 2012-08-12 | DRG: 885 | Disposition: A | Discharge status: Home / Self Care | Payer: 59 | Attending: Psychiatry | Admitting: Psychiatry

## 2012-08-07 ENCOUNTER — Encounter (HOSPITAL_COMMUNITY): Payer: Self-pay | Admitting: Intensive Care

## 2012-08-07 DIAGNOSIS — F419 Anxiety disorder, unspecified: Secondary | ICD-10-CM

## 2012-08-07 DIAGNOSIS — F32A Depression, unspecified: Secondary | ICD-10-CM

## 2012-08-07 DIAGNOSIS — R45851 Suicidal ideations: Secondary | ICD-10-CM

## 2012-08-07 DIAGNOSIS — F332 Major depressive disorder, recurrent severe without psychotic features: Principal | ICD-10-CM | POA: Diagnosis present

## 2012-08-07 DIAGNOSIS — F411 Generalized anxiety disorder: Secondary | ICD-10-CM

## 2012-08-07 DIAGNOSIS — F329 Major depressive disorder, single episode, unspecified: Secondary | ICD-10-CM

## 2012-08-07 DIAGNOSIS — F3130 Bipolar disorder, current episode depressed, mild or moderate severity, unspecified: Secondary | ICD-10-CM

## 2012-08-07 DIAGNOSIS — Z79899 Other long term (current) drug therapy: Secondary | ICD-10-CM

## 2012-08-07 LAB — COMPREHENSIVE METABOLIC PANEL
BUN: 8 mg/dL (ref 6–23)
Calcium: 8.4 mg/dL (ref 8.4–10.5)
GFR calc Af Amer: >90 mL/min (ref >90)
GFR calc non Af Amer: >90 mL/min (ref >90)
Glucose, Bld: 95 mg/dL (ref 70–99)
Total Protein: 6.1 g/dL (ref 6.0–8.3)

## 2012-08-07 MED ORDER — NICOTINE 21 MG/24HR TD PT24
21.0000 mg | MEDICATED_PATCH | Freq: Every day | TRANSDERMAL | Status: DC
  Administered 2012-08-07 – 2012-08-12 (×5): 21 mg via TRANSDERMAL
  Filled 2012-08-07 (×10): qty 1

## 2012-08-07 MED ORDER — TRAMADOL HCL 50 MG PO TABS
100.0000 mg | ORAL_TABLET | Freq: Four times a day (QID) | ORAL | Status: DC | PRN
  Administered 2012-08-07 – 2012-08-12 (×16): 100 mg via ORAL
  Filled 2012-08-07 (×16): qty 2

## 2012-08-07 MED ORDER — ALUM & MAG HYDROXIDE-SIMETH 200-200-20 MG/5ML PO SUSP
30.0000 mL | ORAL | Status: DC | PRN
  Administered 2012-08-07 – 2012-08-09 (×6): 30 mL via ORAL

## 2012-08-07 MED ORDER — INFLUENZA VIRUS VACC SPLIT PF IM SUSP
0.5000 mL | INTRAMUSCULAR | Status: AC
  Administered 2012-08-08: 0.5 mL via INTRAMUSCULAR

## 2012-08-07 MED ORDER — GABAPENTIN 300 MG PO CAPS
300.0000 mg | ORAL_CAPSULE | Freq: Three times a day (TID) | ORAL | Status: DC
  Administered 2012-08-07 – 2012-08-12 (×15): 300 mg via ORAL
  Filled 2012-08-07: qty 15
  Filled 2012-08-07 (×6): qty 1
  Filled 2012-08-07: qty 15
  Filled 2012-08-07 (×5): qty 1
  Filled 2012-08-07: qty 15
  Filled 2012-08-07 (×9): qty 1

## 2012-08-07 MED ORDER — GUAIFENESIN-DM 100-10 MG/5ML PO SYRP
10.0000 mL | ORAL_SOLUTION | Freq: Three times a day (TID) | ORAL | Status: DC | PRN
  Administered 2012-08-08 (×2): 10 mL via ORAL

## 2012-08-07 MED ORDER — MIRTAZAPINE 15 MG PO TABS
7.5000 mg | ORAL_TABLET | Freq: Every day | ORAL | Status: DC
  Filled 2012-08-07 (×2): qty 0.5

## 2012-08-07 MED ORDER — DIVALPROEX SODIUM ER 500 MG PO TB24
1000.0000 mg | ORAL_TABLET | Freq: Every day | ORAL | Status: DC
  Administered 2012-08-07 – 2012-08-11 (×5): 1000 mg via ORAL
  Filled 2012-08-07: qty 10
  Filled 2012-08-07 (×7): qty 2

## 2012-08-07 MED ORDER — ALBUTEROL SULFATE HFA 108 (90 BASE) MCG/ACT IN AERS: 2.0000 | INHALATION_SPRAY | RESPIRATORY_TRACT | Status: DC | PRN

## 2012-08-07 MED ORDER — ACETAMINOPHEN 325 MG PO TABS: 650.0000 mg | ORAL_TABLET | Freq: Four times a day (QID) | ORAL | Status: DC | PRN

## 2012-08-07 MED ORDER — FLUOXETINE HCL 20 MG PO CAPS
20.0000 mg | ORAL_CAPSULE | Freq: Every day | ORAL | Status: DC
  Administered 2012-08-08: 20 mg via ORAL
  Filled 2012-08-07 (×4): qty 1

## 2012-08-07 MED ORDER — ALUM & MAG HYDROXIDE-SIMETH 200-200-20 MG/5ML PO SUSP: 30.0000 mL | ORAL | Status: DC | PRN

## 2012-08-07 MED ORDER — ACETAMINOPHEN 325 MG PO TABS
650.0000 mg | ORAL_TABLET | Freq: Four times a day (QID) | ORAL | Status: DC | PRN
  Administered 2012-08-08 – 2012-08-11 (×4): 650 mg via ORAL

## 2012-08-07 MED ORDER — MAGNESIUM HYDROXIDE 400 MG/5ML PO SUSP
30.0000 mL | Freq: Every day | ORAL | Status: DC | PRN
  Administered 2012-08-11: 30 mL via ORAL

## 2012-08-07 MED ORDER — MAGNESIUM HYDROXIDE 400 MG/5ML PO SUSP: 30.0000 mL | Freq: Every day | ORAL | Status: DC | PRN

## 2012-08-07 MED ORDER — ASPIRIN-ACETAMINOPHEN-CAFFEINE 250-250-65 MG PO TABS: 2.0000 | ORAL_TABLET | Freq: Four times a day (QID) | ORAL | Status: DC | PRN

## 2012-08-07 MED ORDER — ALPRAZOLAM 1 MG PO TABS
1.0000 mg | ORAL_TABLET | Freq: Three times a day (TID) | ORAL | Status: DC | PRN
  Administered 2012-08-07 – 2012-08-11 (×10): 1 mg via ORAL
  Filled 2012-08-07 (×10): qty 1

## 2012-08-07 MED ORDER — RISPERIDONE 2 MG PO TABS
4.0000 mg | ORAL_TABLET | Freq: Every day | ORAL | Status: DC
  Administered 2012-08-08 – 2012-08-11 (×4): 4 mg via ORAL
  Filled 2012-08-07 (×4): qty 2
  Filled 2012-08-07: qty 10
  Filled 2012-08-07 (×3): qty 2

## 2012-08-07 NOTE — Progress Notes (Signed)
Patient presents as a walk-in. Patient states that she was "just here last week" and that she "had to come back" because her depression has increased dramatically. The patient states that she "thought about overdosing on tylenol but didn't." The patient states that she "doesn't know why" she feels this way. Patient appears slightly drowsy and is slightly unsteady on her feet. The patient reports that one of her medications caused these symptoms but that she is unsure of which medication it is. The patient was given education regarding her fall risk status (medium) and was given a yellow armband. The patient has a flat affect and a depressed mood. The patient currently denies any SI/HI or auditory and visual hallucinations. Patient escorted to unit. Will continue to monitor patient for safety.
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
## 2012-08-07 NOTE — Progress Notes (Signed)
[facility] LCSW Group Therapy      Emotional Regulation 1:15 - 2:30 PM          08/07/2012 2:08 PM  Type of Therapy:  Group Therapy  Participation Level:  Patient entered group room as session was ending.  [name] 08/07/2012, 2:08 PM

## 2012-08-07 NOTE — Tx Team (Signed)
Initial Interdisciplinary Treatment Plan  PATIENT STRENGTHS: (choose at least two) Active sense of humor Supportive family/friends  PATIENT STRESSORS: Health problems Medication change or noncompliance   PROBLEM LIST: Problem List/Patient Goals Date to be addressed Date deferred Reason deferred Estimated date of resolution  Depression 08/08/11                                                      DISCHARGE CRITERIA:  Ability to meet basic life and health needs Adequate post-discharge living arrangements Improved stabilization in mood, thinking, and/or behavior Medical problems require only outpatient monitoring  PRELIMINARY DISCHARGE PLAN: Attend aftercare/continuing care group Outpatient therapy  PATIENT/FAMIILY INVOLVEMENT: This treatment plan has been presented to and reviewed with the patient, [name].  The patient has been given the opportunity to ask questions and make suggestions.  Nestor Ramp [facility] 08/07/2012, 12:22 PM

## 2012-08-07 NOTE — [facility] Suicide Risk Assessment (Signed)
Suicide Risk Assessment  Admission Assessment     Nursing information obtained from:  Patient Demographic factors:  Caucasian;Unemployed;Low socioeconomic status Current Mental Status:  Suicidal ideation indicated by patient Loss Factors:  NA Historical Factors:  Prior suicide attempts;Family history of mental illness or substance abuse Risk Reduction Factors:  Sense of responsibility to family;Positive social support;Living with another person, especially a relative  CLINICAL FACTORS:   Severe Anxiety and/or Agitation Depression:   Anhedonia Hopelessness Impulsivity Insomnia Severe  COGNITIVE FEATURES THAT CONTRIBUTE TO RISK:  Thought constriction (tunnel vision)    SUICIDE RISK:   Mild:  Suicidal ideation of limited frequency, intensity, duration, and specificity.  There are no identifiable plans, no associated intent, mild dysphoria and related symptoms, good self-control (both objective and subjective assessment), few other risk factors, and identifiable protective factors, including available and accessible social support.  PLAN OF CARE: Reinitiate home medications. Encourage attending groups. Discharge when safe and stable.  I certify that inpatient services furnished can reasonably be expected to improve the patient's condition.  [name] 08/07/2012, 1:55 PM

## 2012-08-07 NOTE — Progress Notes (Signed)
Recreation Therapy Notes  08/07/2012         Time: 3:00pm      Group Topic/Focus: Time Management  Participation Level: Did not attend   Jeanifer Halliday L Jeraldin Fesler, LRT/CTRS  Alexzandrea Normington L 08/07/2012 3:44 PM 

## 2012-08-07 NOTE — Progress Notes (Signed)
Adult Psychosocial Assessment Update Interdisciplinary Team  Previous [facility] admissions/discharges:  Admissions Discharges  Date: 07/29/12 08/05/08/14  Date: Date:  Date: Date:  Date: Date:  Date: Date:   Changes since the last Psychosocial Assessment (including adherence to outpatient mental health and/or substance abuse treatment, situational issues contributing to decompensation and/or relapse). Patient advised of increasing depression and SI since discharging from the hospital on 08/05/12.  She endorses poor sleep and lack of energy and requesting medications be adjusted.             Discharge Plan 1. Will you be returning to the same living situation after discharge?   Yes: No:      If no, what is your plan?    Patient to return to friend's home at discharge.       2. Would you like a referral for services when you are discharged? Yes:     If yes, for what services?  No:       Patient to be referred to [facility] for outpatient services       Summary and Recommendations (to be completed by the evaluator) [name] is a 42 years old Caucasian female who readmitted to hospital with Major  Depression Disorder.  She will Patient will benefit from crisis stabilization, evaluation for medication management, psycho education groups for coping skills development, group therapy and assistance with discharge planning.                      Signature:  [name], 08/07/2012 12:44 PM
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
## 2012-08-08 LAB — RAPID URINE DRUG SCREEN, HOSP PERFORMED
Benzodiazepines: POSITIVE — AB
Cocaine: NOT DETECTED

## 2012-08-08 LAB — TSH: TSH: 3.152 u[IU]/mL (ref 0.350–4.500)

## 2012-08-08 MED ORDER — FLUOXETINE HCL 20 MG PO CAPS
40.0000 mg | ORAL_CAPSULE | Freq: Every day | ORAL | Status: DC
  Administered 2012-08-09 – 2012-08-12 (×4): 40 mg via ORAL
  Filled 2012-08-08 (×4): qty 2
  Filled 2012-08-08: qty 10
  Filled 2012-08-08: qty 2

## 2012-08-08 NOTE — Clinical Social Work Note (Signed)
Today patient states she and husband plan to travel from here to [facility] on Monday where they will be staying with pt.'s mother.  She states this is a positive environment, and she is hopeful things will be better there.  She has a follow up appointment at [facility] on Tues., Feb 18.

## 2012-08-08 NOTE — Progress Notes (Signed)
D: Patient in bed at the beginning of this shift. She appeared sad and depressed, she endorsed suicide thoughts and auditory hallucinations. Patient came to the medication window to check when her Xanax and Tramadol order due for administration. A: Writer encouraged and supported patient, encouraged him to go with peers to the gym, checked when her prn medications due. R: Patient receptive to encouragement. Q 15 minute check continues to maintain safety.

## 2012-08-08 NOTE — Progress Notes (Signed)
[facility] Group Notes:  (Nursing/MHT/Case Management/Adjunct)  Date:  08/08/2012  Time:  4:04 PM  Type of Therapy:  Psychoeducational Skills  Participation Level:  Did Not Attend  Summary of Progress/Problems: Renise did not attend group that discussed what factors contribute to being out of balance and behaviors or though processes assist in creating balance in their lives.  Wandra Scot 08/08/2012, 4:04 PM

## 2012-08-08 NOTE — Progress Notes (Signed)
[facility] MD Progress Note  08/08/2012 8:50 AM [name]  MRN:  [id number] Subjective:  Patient reports feeling dizzy in the mornings. Feels the Remeron is causing her to be dizzy in the mornings and her knees buckle.She reports seeing her food dance on her plate. Has been hearing voices. Continues to feel depressed and has suicidal thoughts. Complaining of increased back pain, states the tramadol is wearing out quickly, would like to take percocet.  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 Seizures   . 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 Depression   . Heart murmur   . Migraine    Axis IV: economic problems, housing problems and other psychosocial or environmental problems Axis V: 41-50 serious symptoms  ADL's:  Intact  Sleep: Fair  Appetite:  Fair   Psychiatric Specialty Exam: Review of Systems  Constitutional: Negative.   HENT: Negative.   Eyes: Negative.   Respiratory: Negative.   Cardiovascular: Negative.   Gastrointestinal: Negative.   Genitourinary: Negative.   Musculoskeletal: Negative.   Skin: Negative.   Neurological: Positive for dizziness.  Endo/Heme/Allergies: Negative.   Psychiatric/Behavioral: Positive for depression, suicidal ideas and hallucinations. The patient is nervous/anxious.     Blood pressure 109/77, pulse 86, temperature 98.3 F (36.8 C), temperature source Oral, resp. rate 16, height 5\' 3"  (1.6 m), weight 73.936 kg (163 lb), last menstrual period 07/29/2012, SpO2 96.00%.Body mass index is 28.88 kg/(m^2).  General Appearance: Disheveled  Eye Solicitor::  Fair  Speech:  Slow  Volume:  Decreased  Mood:  Anxious, Depressed and Hopeless  Affect:  Constricted and Depressed  Thought Process:  Coherent  Orientation:  Full (Time, Place, and Person)  Thought Content:  Hallucinations: Auditory and Rumination  Suicidal Thoughts:  Yes.  with intent/plan  Homicidal  Thoughts:  No  Memory:  Immediate;   Fair Recent;   Fair Remote;   Fair  Judgement:  Fair  Insight:  Shallow  Psychomotor Activity:  Decreased  Concentration:  Fair  Recall:  Fair  Akathisia:  No  Handed:  Right  AIMS (if indicated):     Assets:  Communication Skills Desire for Improvement  Sleep:      Current Medications: Current Facility-Administered Medications  Medication Dose Route Frequency Provider Last Rate Last Dose  . acetaminophen (TYLENOL) tablet 650 mg  650 mg Oral Q6H PRN Nanine Means, NP   650 mg at 08/08/12 0151  . albuterol (PROVENTIL HFA;VENTOLIN HFA) 108 (90 BASE) MCG/ACT inhaler 2 puff  2 puff Inhalation Q4H PRN Nanine Means, NP      . ALPRAZolam Prudy Feeler) tablet 1 mg  1 mg Oral TID PRN Kerry Hough, PA   1 mg at 08/08/12 0744  . alum & mag hydroxide-simeth (MAALOX/MYLANTA) 200-200-20 MG/5ML suspension 30 mL  30 mL Oral Q4H PRN Nanine Means, NP   30 mL at 08/08/12 0548  . aspirin-acetaminophen-caffeine (EXCEDRIN MIGRAINE) per tablet 2 tablet  2 tablet Oral Q6H PRN Nanine Means, NP      . divalproex (DEPAKOTE ER) 24 hr tablet 1,000 mg  1,000 mg Oral Daily Nanine Means, NP   1,000 mg at 08/07/12 2211  . [START ON 08/09/2012] FLUoxetine (PROZAC) capsule 40 mg  40 mg Oral Daily Besnik Febus, MD      . gabapentin (NEURONTIN) capsule 300 mg  300 mg Oral TID Nanine Means, NP   300 mg  at 08/08/12 0742  . guaiFENesin-dextromethorphan (ROBITUSSIN DM) 100-10 MG/5ML syrup 10 mL  10 mL Oral Q8H PRN Asya Derryberry, MD   10 mL at 08/08/12 0149  . influenza  inactive virus vaccine (FLUZONE/FLUARIX) injection 0.5 mL  0.5 mL Intramuscular Tomorrow-1000 Lavontae Cornia, MD      . magnesium hydroxide (MILK OF MAGNESIA) suspension 30 mL  30 mL Oral Daily PRN Nanine Means, NP      . nicotine (NICODERM CQ - dosed in mg/24 hours) patch 21 mg  21 mg Transdermal Daily Beaux Verne, MD   21 mg at 08/08/12 0742  . risperiDONE (RISPERDAL) tablet 4 mg  4 mg Oral QHS Nanine Means, NP      .  traMADol Janean Sark) tablet 100 mg  100 mg Oral Q6H PRN Nanine Means, NP   100 mg at 08/08/12 0417    Lab Results:  Results for orders placed during the hospital encounter of 08/07/12 (from the past 48 hour(s))  COMPREHENSIVE METABOLIC PANEL     Status: Abnormal   Collection Time    08/07/12  8:36 PM      Result Value Range   Sodium 135  135 - 145 mEq/L   Potassium 4.2  3.5 - 5.1 mEq/L   Chloride 99  96 - 112 mEq/L   CO2 30  19 - 32 mEq/L   Glucose, Bld 95  70 - 99 mg/dL   BUN 8  6 - 23 mg/dL   Creatinine, Ser 1.61  0.50 - 1.10 mg/dL   Calcium 8.4  8.4 - 09.6 mg/dL   Total Protein 6.1  6.0 - 8.3 g/dL   Albumin 2.7 (*) 3.5 - 5.2 g/dL   AST 35  0 - 37 U/L   ALT 28  0 - 35 U/L   Alkaline Phosphatase 80  39 - 117 U/L   Total Bilirubin 0.2 (*) 0.3 - 1.2 mg/dL   GFR calc non Af Amer >90  >90 mL/min   GFR calc Af Amer >90  >90 mL/min   Comment:            The eGFR has been calculated     using the CKD EPI equation.     This calculation has not been     validated in all clinical     situations.     eGFR's persistently     <90 mL/min signify     possible Chronic Kidney Disease.  TSH     Status: None   Collection Time    08/07/12  8:36 PM      Result Value Range   TSH 3.152  0.350 - 4.500 uIU/mL    Physical Findings: AIMS: Facial and Oral Movements Muscles of Facial Expression: None, normal Lips and Perioral Area: None, normal Jaw: None, normal Tongue: None, normal,Extremity Movements Upper (arms, wrists, hands, fingers): None, normal Lower (legs, knees, ankles, toes): None, normal, Trunk Movements Neck, shoulders, hips: None, normal, Overall Severity Severity of abnormal movements (highest score from questions above): None, normal Incapacitation due to abnormal movements: None, normal Patient's awareness of abnormal movements (rate only patient's report): No Awareness, Dental Status Current problems with teeth and/or dentures?: No Does patient usually wear dentures?: No   CIWA:    COWS:     Treatment Plan Summary: Daily contact with patient to assess and evaluate symptoms and progress in treatment Medication management  Plan: Discontinue Remeron. Increase Prozac to 40mg  po qd. Patient aware of side effects and benefits. Increase Tramadol frequency  to help with pain.   Medical Decision Making Problem Points:  Established problem, stable/improving (1), Review of last therapy session (1) and Review of psycho-social stressors (1) Data Points:  Review of medication regiment & side effects (2) Review of new medications or change in dosage (2)  I certify that inpatient services furnished can reasonably be expected to improve the patient's condition.   Sakai Heinle 08/08/2012, 8:50 AM

## 2012-08-08 NOTE — Treatment Plan (Signed)
Interdisciplinary Treatment Plan Update (Adult)  Date: 08/08/2012  Time Reviewed: 12:48 PM   Progress in Treatment: Attending groups: Yes Participating in groups: Yes Taking medication as prescribed: Yes Tolerating medication: Yes   Family/Significant other contact made:  No Patient understands diagnosis:  Yes  As evidenced by asking for help with depression, SI Discussing patient identified problems/goals with staff:  Yes  See below Medical problems stabilized or resolved:  Yes Denies suicidal/homicidal ideation: No  Marked "on and off" on self inventory Issues/concerns per patient self-inventory:  Yes  C/O poor sleep, rates pain, depression and hopelessness all at 9's Other:  New problem(s) identified: N/A  Reason for Continuation of Hospitalization: Depression Hallucinations Medication stabilization Suicidal ideation  Interventions implemented related to continuation of hospitalization: Restart meds from previous admission, and adjust accordingly  Encourage group attendance and participation  Additional comments:  Estimated length of stay: 2-3 days  Discharge Plan: see below  New goal(s): N/A  Review of initial/current patient goals per problem list:   1.  Goal(s): Eliminate SI  Met:  No  Target date:2/18  As evidenced [id number] report  2.  Goal (s): Eliminate psychosis with medication/therapeutic milieu  Met:  No  Target date:2/18  As evidenced by: Today she states she is seeing her food dancing on her plate, and is hearing negative voices that tell her to "just do it"  3.  Goal(s): Stabilize mood with medication and therapeutic milieu  Met:  No  Target date:2/18  As evidenced [id number] will rate her depression and hopelessness at a 4 or less on 10 scale at d/c  4.  Goal(s): Identify dispositional and outpt follow up plan  Met:  Yes  Target date:2/13  As evidenced [id number] go to stay in [facility] with mother and follow up outpt in  [place]  Attendees: Patient:     Family:     Physician:  Patrick Florene Brill  08/08/2012 12:48 PM   Nursing:   Harold Barban 08/08/2012 12:48 PM   Clinical Social Worker:  Richelle Ito 08/08/2012 12:48 PM   Extender:   08/08/2012 12:48 PM   Other:     Other:     Other:     Other:      Scribe for Treatment Team:   Ida Rogue, 08/08/2012 12:48 PM

## 2012-08-08 NOTE — Progress Notes (Signed)
Pt returned to the unit after being discharged Monday to go to her mother's house.  Instead, pt went to stay with her husband who lives in a house with several other people.  Pt says her depression increased to the point of feeling suicidal.  Some friends took her to the ED, and that is why she is back.  Pt appears sedated/blunted/depressed.  She says she is not taking the risperdal or the remeron tonight, because one of them is making her very tired and causing her symptoms.  Pt did want her Xanax added back.  Called PA on call to get the order for her home med of xanax.  Pt says she can contract for safety on the unit.  She denies HI/AV.  Support/encouragement given.  Safety maintained with q15 minute checks.

## 2012-08-08 NOTE — H&P (Signed)
Reviewed. Patient seen and assessed, agree with key elements.

## 2012-08-08 NOTE — Progress Notes (Signed)
D: Patient cooperative with staff and peers. Patient's affect/mood is sullen, depressed and anxious. Patient complained of pain 8/10; also c/o anxiety 9/10. She reported on the self inventory sheet that her appetite is poor, energy level is low and ability to pay attention is poor. Patient rated depression and feelings of hopelessness "9". She verbalized earlier that she has auditory hallucinations, but the voices are not telling her to kill herself or anything else, they're just present.   A: Support and encouragement provided to patient. Administered scheduled medications per MD orders. Monitor Q15 minute checks for safety.  R: Patient receptive. Passive SI/AH, but contracts for safety. Denies HI/VH. Patient remains safe.

## 2012-08-09 DIAGNOSIS — F329 Major depressive disorder, single episode, unspecified: Secondary | ICD-10-CM

## 2012-08-09 DIAGNOSIS — F29 Unspecified psychosis not due to a substance or known physiological condition: Secondary | ICD-10-CM

## 2012-08-09 MED ORDER — TRAZODONE HCL 100 MG PO TABS
100.0000 mg | ORAL_TABLET | Freq: Every day | ORAL | Status: DC
  Administered 2012-08-09 – 2012-08-11 (×3): 100 mg via ORAL
  Filled 2012-08-09 (×4): qty 1
  Filled 2012-08-09: qty 5

## 2012-08-09 MED ORDER — SIMETHICONE 80 MG PO CHEW
80.0000 mg | CHEWABLE_TABLET | Freq: Four times a day (QID) | ORAL | Status: DC | PRN
  Administered 2012-08-09 – 2012-08-11 (×3): 80 mg via ORAL
  Filled 2012-08-09: qty 1

## 2012-08-09 NOTE — Progress Notes (Signed)
Recreation Therapy Notes  Date: 02.14.2014  Time: 3:00pm      Group Topic/Focus: Goal Setting  Participation Level: Active  Participation Quality: Appropriate  Affect: Flat  Cognitive: Appropriate   Additional Comments: Patient created Recovery Goal Chart. Patient shared goal of getting help with depression with group. Patient discussed obstacles she will encounter. Patient discussed changing her living situation, she shared that post discharge she will be moving to [facility] to live with her mother. Patient appeared confident the change in scenery would help keep her depression away. Patient contributed to group discussion on peers goals.   Marykay Lex Damareon Lanni, LRT/CTRS   Jearl Klinefelter 08/09/2012 3:59 PM

## 2012-08-09 NOTE — Progress Notes (Signed)
Patient ID: [name], female   DOB: [birth year], 42 y.o.   MRN: [id number] [facility] MD Progress Note  08/09/2012 11:47 AM [name]  MRN:  [id number]  Subjective: "I'm not feeling good today. I feel very depressed, a lot of anxiety.Prior to being a patient here, I was living with bunch of crack heads because my husband and I do not have any other place to live. I don't abuse illegal drugs and I'm not an addict. My husband and I do not have any place to live. But, I have heard my mother who lives in [place], near [address]. She has agreed to let my husband and I move down to [facility] and live in one of her beach homes. The sad news is that I will have to leave my loved ones behind here in [place] to move to [facility]. This news is bitter/sweet for me. I am not sleeping at night. I was on Trazodone 150 mg at night in the past. It did help me. I need my Ultram dose increased because my pain is not letting out. I'm still suicidal and the voices, I still hear".  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 Seizures   . 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 Depression   . Heart murmur   . Migraine    Axis IV: economic problems, housing problems and other psychosocial or environmental problems Axis V: 41-50 serious symptoms  ADL's:  Intact  Sleep: Poor  Appetite:  Fair   Psychiatric Specialty Exam: Review of Systems  Constitutional: Negative.   HENT: Negative.   Eyes: Negative.   Respiratory: Negative.   Cardiovascular: Negative.   Gastrointestinal: Negative.   Genitourinary: Negative.   Musculoskeletal: Negative.   Skin: Negative.   Neurological: Positive for dizziness.  Endo/Heme/Allergies: Negative.   Psychiatric/Behavioral: Positive for depression, suicidal ideas and hallucinations. The patient is nervous/anxious.     Blood pressure 105/71, pulse 108, temperature 97.5 F (36.4 C),  temperature source Oral, resp. rate 20, height 5\' 3"  (1.6 m), weight 73.936 kg (163 lb), last menstrual period 07/29/2012, SpO2 96.00%.Body mass index is 28.88 kg/(m^2).  General Appearance: Disheveled  Eye Contact::  Fair  Speech:  Slow, not spontaneous.  Volume:  Decreased  Mood:  Anxious, Depressed and Hopeless  Affect:  Constricted and Depressed  Thought Process:  Coherent  Orientation:  Full (Time, Place, and Person)  Thought Content:  Hallucinations: Auditory and Rumination  Suicidal Thoughts:  Yes.  with intent/plan  Homicidal Thoughts:  No  Memory:  Immediate;   Fair Recent;   Fair Remote;   Fair  Judgement:  Fair  Insight:  Shallow  Psychomotor Activity:  Decreased  Concentration:  Fair  Recall:  Fair  Akathisia:  No  Handed:  Right  AIMS (if indicated):     Assets:  Communication Skills Desire for Improvement  Sleep:  Number of Hours: 6   Current Medications: Current Facility-Administered Medications  Medication Dose Route Frequency Provider Last Rate Last Dose  . acetaminophen (TYLENOL) tablet 650 mg  650 mg Oral Q6H PRN Nanine Means, NP   650 mg at 08/08/12 0151  . albuterol (PROVENTIL HFA;VENTOLIN HFA) 108 (90 BASE) MCG/ACT inhaler 2 puff  2 puff Inhalation Q4H PRN Nanine Means, NP      . ALPRAZolam Prudy Feeler) tablet 1 mg  1 mg Oral TID PRN Karleen Hampshire  Nancy Fetter, PA   1 mg at 08/09/12 0804  . aspirin-acetaminophen-caffeine (EXCEDRIN MIGRAINE) per tablet 2 tablet  2 tablet Oral Q6H PRN Nanine Means, NP      . divalproex (DEPAKOTE ER) 24 hr tablet 1,000 mg  1,000 mg Oral Daily Nanine Means, NP   1,000 mg at 08/08/12 2151  . FLUoxetine (PROZAC) capsule 40 mg  40 mg Oral Daily Himabindu Ravi, MD   40 mg at 08/09/12 0803  . gabapentin (NEURONTIN) capsule 300 mg  300 mg Oral TID Nanine Means, NP   300 mg at 08/09/12 0803  . guaiFENesin-dextromethorphan (ROBITUSSIN DM) 100-10 MG/5ML syrup 10 mL  10 mL Oral Q8H PRN Himabindu Ravi, MD   10 mL at 08/08/12 1518  . magnesium hydroxide  (MILK OF MAGNESIA) suspension 30 mL  30 mL Oral Daily PRN Nanine Means, NP      . nicotine (NICODERM CQ - dosed in mg/24 hours) patch 21 mg  21 mg Transdermal Daily Himabindu Ravi, MD   21 mg at 08/08/12 0742  . risperiDONE (RISPERDAL) tablet 4 mg  4 mg Oral QHS Nanine Means, NP   4 mg at 08/08/12 2151  . simethicone (MYLICON) chewable tablet 80 mg  80 mg Oral QID PRN Sanjuana Kava, NP      . traMADol Janean Sark) tablet 100 mg  100 mg Oral Q6H PRN Nanine Means, NP   100 mg at 08/09/12 4540  . traZODone (DESYREL) tablet 100 mg  100 mg Oral QHS Sanjuana Kava, NP        Lab Results:  Results for orders placed during the hospital encounter of 08/07/12 (from the past 48 hour(s))  URINE RAPID DRUG SCREEN (HOSP PERFORMED)     Status: Abnormal   Collection Time    08/07/12  2:31 PM      Result Value Range   Opiates NONE DETECTED  NONE DETECTED   Cocaine NONE DETECTED  NONE DETECTED   Benzodiazepines POSITIVE (*) NONE DETECTED   Amphetamines NONE DETECTED  NONE DETECTED   Tetrahydrocannabinol NONE DETECTED  NONE DETECTED   Barbiturates NONE DETECTED  NONE DETECTED   Comment:            DRUG SCREEN FOR MEDICAL PURPOSES     ONLY.  IF CONFIRMATION IS NEEDED     FOR ANY PURPOSE, NOTIFY LAB     WITHIN 5 DAYS.                LOWEST DETECTABLE LIMITS     FOR URINE DRUG SCREEN     Drug Class       Cutoff (ng/mL)     Amphetamine      1000     Barbiturate      200     Benzodiazepine   200     Tricyclics       300     Opiates          300     Cocaine          300     THC              50  COMPREHENSIVE METABOLIC PANEL     Status: Abnormal   Collection Time    08/07/12  8:36 PM      Result Value Range   Sodium 135  135 - 145 mEq/L   Potassium 4.2  3.5 - 5.1 mEq/L   Chloride 99  96 - 112 mEq/L  CO2 30  19 - 32 mEq/L   Glucose, Bld 95  70 - 99 mg/dL   BUN 8  6 - 23 mg/dL   Creatinine, Ser 0.98  0.50 - 1.10 mg/dL   Calcium 8.4  8.4 - 11.9 mg/dL   Total Protein 6.1  6.0 - 8.3 g/dL   Albumin 2.7  (*) 3.5 - 5.2 g/dL   AST 35  0 - 37 U/L   ALT 28  0 - 35 U/L   Alkaline Phosphatase 80  39 - 117 U/L   Total Bilirubin 0.2 (*) 0.3 - 1.2 mg/dL   GFR calc non Af Amer >90  >90 mL/min   GFR calc Af Amer >90  >90 mL/min   Comment:            The eGFR has been calculated     using the CKD EPI equation.     This calculation has not been     validated in all clinical     situations.     eGFR's persistently     <90 mL/min signify     possible Chronic Kidney Disease.  TSH     Status: None   Collection Time    08/07/12  8:36 PM      Result Value Range   TSH 3.152  0.350 - 4.500 uIU/mL    Physical Findings: AIMS: Facial and Oral Movements Muscles of Facial Expression: None, normal Lips and Perioral Area: None, normal Jaw: None, normal Tongue: None, normal,Extremity Movements Upper (arms, wrists, hands, fingers): None, normal Lower (legs, knees, ankles, toes): None, normal, Trunk Movements Neck, shoulders, hips: None, normal, Overall Severity Severity of abnormal movements (highest score from questions above): None, normal Incapacitation due to abnormal movements: None, normal Patient's awareness of abnormal movements (rate only patient's report): No Awareness, Dental Status Current problems with teeth and/or dentures?: No Does patient usually wear dentures?: No  CIWA:    COWS:     Treatment Plan Summary: Daily contact with patient to assess and evaluate symptoms and progress in treatment Medication management  Plan: Supportive approach/coping skills/relapse prevention. (1). Initiate Trazodone 100 mg Q bedtime for sleep, (2). Mylicon tabs 80 mg QID prn for gas/bloating.  Encouraged out of room, participation in group sessions and application of coping skills when distressed. Will continue to monitor response to/adverse effects of medications in use to assure effectiveness. Continue to monitor mood, behavior and interaction with staff and other patients. Continue current plan of  care.  Medical Decision Making Problem Points:  Established problem, stable/improving (1), Review of last therapy session (1) and Review of psycho-social stressors (1) Data Points:  Review of medication regiment & side effects (2) Review of new medications or change in dosage (2)  I certify that inpatient services furnished can reasonably be expected to improve the patient's condition.   Armandina Stammer I 08/09/2012, 11:47 AM

## 2012-08-09 NOTE — Progress Notes (Signed)
Patient Discharge Instructions:  After Visit Summary (AVS):   Faxed to:  08/09/12 Discharge Summary Note:   Faxed to:  08/09/12 Psychiatric Admission Assessment Note:   Faxed to:  08/09/12 Suicide Risk Assessment - Discharge Assessment:   Faxed to:  08/09/12 Faxed/Sent to the Next Level Care provider:  08/09/12 Faxed to [facility] @ [phone]  Jerelene Redden, 08/09/2012, 3:54 PM

## 2012-08-09 NOTE — Progress Notes (Signed)
Agree with assessment and plans  Ossie Beltran A. Rashanna Christiana, M.D. 

## 2012-08-09 NOTE — Progress Notes (Signed)
[facility] LCSW Group Therapy      Feelings About Diagnosis 1:15 - 2:30 PM          08/09/2012 3:50 PM  Type of Therapy:  Group Therapy  Participation Level:  Minimal  Participation Quality:  Appropriate and Drowsy  Affect:  Appropriate, Depressed and Flat  Cognitive:  Appropriate  Insight:  Improving  Engagement in Therapy:  Improving  Modes of Intervention:  Discussion, Education, Exploration, Problem-solving, Rapport Building and Support  Summary of Progress/Problems: Patient shared she relapsed as a result of not taking her medications.  She share the meds tend to make her drowsy and she stops taking it.  Patient stated he understand that she may have to deal with negative side effects but must stay on her medications or become suicidal.  [name] 08/09/2012, 3:50 PM

## 2012-08-09 NOTE — Progress Notes (Signed)
D: Patient denies HI and A/V hallucinations and on and off thoughts if SI; patient reports sleep to be fair; reports appetite to be improving ; reports energy level is low ; reports ability to pay attention to be improving; rates depression as 9/10; rates hopelessness 9/10; rates anxiety as 10/10;   A: Monitored q 15 minutes; patient encouraged to attend groups; patient educated about medications; patient given medications per physician orders; patient encouraged to express feelings and/or concerns  R: Patient  remains assertive and needy and can be commanding at times; patient's interaction with staff and peers is appropriate at most times; patient was able to set goal to talk with staff 1:1 when having feelings of SI; patient is taking medications as prescribed and tolerating medications; patient is attending all groups

## 2012-08-09 NOTE — Progress Notes (Signed)
Agree with assessment and plan [name] A. [name], M.D. 

## 2012-08-09 NOTE — Progress Notes (Signed)
[facility] INPATIENT:  Family/Significant Other Suicide Prevention Education  Suicide Prevention Education:  Education Completed; [name], Mother, [phone] has been identified by the patient as the family member/significant other with whom the patient will be residing, and identified as the person(s) who will aid the patient in the event of a mental health crisis (suicidal ideations/suicide attempt).  With written consent from the patient, the family member/significant other has been provided the following suicide prevention education, prior to the and/or following the discharge of the patient.  The suicide prevention education provided includes the following:  Suicide risk factors  Suicide prevention and interventions  National Suicide Hotline telephone number  [facility] assessment telephone number  [facility] Emergency Assistance 911  [facility] and/or Residential Mobile Crisis Unit telephone number  Request made of family/significant other to:  Remove weapons (e.g., guns, rifles, knives), all items previously/currently identified as safety concern.  Mother reports there are no gun in the home.  Remove drugs/medications (over-the-counter, prescriptions, illicit drugs), all items previously/currently identified as a safety concern.  The family member/significant other verbalizes understanding of the suicide prevention education information provided.  The family member/significant other agrees to remove the items of safety concern listed above.  [name] 08/09/2012, 3:52 PM

## 2012-08-10 NOTE — Progress Notes (Signed)
Psychoeducational Group Note  Date:  07/30/1058 Time: 1015  Group Topic/Focus:  Identifying Needs:   The focus of this group is to help patients identify their personal needs that have been historically problematic and identify healthy behaviors to address their needs.  Participation Level:  active Participation Quality: good Affect: flat Cognitive:    Insight:  good  Engagement in Group: engaged  Additional Comments: Pt was engaged in group, asked appropriate questions, shared personal life experience with the group and demonstrates willingness to process and understand her problems. PDuke RN [facility]

## 2012-08-10 NOTE — Progress Notes (Signed)
Patient came to medication window and reported that it was time for her pain medication and that it was due earlier. Writer explained that her ultram is a prn medication and she has to request her pain medication. Patient received ultram for pain she rated a 9. Writer will monitor effectiveness of medication. Heat pack given, support and encouragement offered. Patient reports passive si and auditory hallucinations ( conversations with family members and she knows they are not there) and verbally contracts for safety, denies hi/visual hallucinations. Safety maintained on unit with 15 min checks, will continue to monitor.

## 2012-08-10 NOTE — Progress Notes (Signed)
Patient ID: [name], female   DOB: 06/13/1971, 42 y.o.   MRN: [id number] [facility] MD Progress Note  08/10/2012 11:44 AM LACRESHIA BONDARENKO  MRN:  [id number]  Subjective:Diagnosis:   Slept well with the Trazadone. Feels the Remeron is out of her system. Denies SI or AH for the past 24 hours. Requests discharge. Patient was discharged last Monday and readmitted Wednesday due to hypersomnia- Remeron.  Encouraged her to discuss with weekend counselor  If possible to discharge this weekend.    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 Seizures   . 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 Depression   . Heart murmur   . Migraine    Axis IV: economic problems, housing problems and other psychosocial or environmental problems Axis V: 41-50 serious symptoms  ADL's:  Intact  Sleep: Poor  Appetite:  Fair   Psychiatric Specialty Exam: Review of Systems  Constitutional: Negative.   HENT: Negative.   Eyes: Negative.   Respiratory: Negative.   Cardiovascular: Negative.   Gastrointestinal: Negative.   Genitourinary: Negative.   Musculoskeletal: Negative.   Skin: Negative.   Neurological: Positive for dizziness.  Endo/Heme/Allergies: Negative.   Psychiatric/Behavioral: Positive for depression, suicidal ideas and hallucinations. The patient is nervous/anxious.     Blood pressure 88/63, pulse 104, temperature 97.2 F (36.2 C), temperature source Oral, resp. rate 20, height 5\' 3"  (1.6 m), weight 73.936 kg (163 lb), last menstrual period 07/29/2012, SpO2 96.00%.Body mass index is 28.88 kg/(m^2).  General Appearance: Disheveled  Eye Contact::  Fair  Speech:  Slow, not spontaneous.  Volume:  Decreased  Mood:  Anxious, Depressed and Hopeless  Affect:  Constricted and Depressed  Thought Process:  Coherent  Orientation:  Full (Time, Place, and Person)  Thought Content:  Hallucinations: Auditory and Rumination  Suicidal  Thoughts:  Yes.  with intent/plan  Homicidal Thoughts:  No  Memory:  Immediate;   Fair Recent;   Fair Remote;   Fair  Judgement:  Fair  Insight:  Shallow  Psychomotor Activity:  Decreased  Concentration:  Fair  Recall:  Fair  Akathisia:  No  Handed:  Right  AIMS (if indicated):     Assets:  Communication Skills Desire for Improvement  Sleep:  Number of Hours: 6.25   Current Medications: Current Facility-Administered Medications  Medication Dose Route Frequency Provider Last Rate Last Dose  . acetaminophen (TYLENOL) tablet 650 mg  650 mg Oral Q6H PRN Nanine Means, NP   650 mg at 08/09/12 1653  . albuterol (PROVENTIL HFA;VENTOLIN HFA) 108 (90 BASE) MCG/ACT inhaler 2 puff  2 puff Inhalation Q4H PRN Nanine Means, NP      . ALPRAZolam Prudy Feeler) tablet 1 mg  1 mg Oral TID PRN Kerry Hough, PA   1 mg at 08/10/12 0843  . aspirin-acetaminophen-caffeine (EXCEDRIN MIGRAINE) per tablet 2 tablet  2 tablet Oral Q6H PRN Nanine Means, NP      . divalproex (DEPAKOTE ER) 24 hr tablet 1,000 mg  1,000 mg Oral Daily Nanine Means, NP   1,000 mg at 08/09/12 2233  . FLUoxetine (PROZAC) capsule 40 mg  40 mg Oral Daily Himabindu Ravi, MD   40 mg at 08/10/12 7829  . gabapentin (NEURONTIN) capsule 300 mg  300 mg Oral TID Nanine Means, NP   300 mg at 08/10/12 5621  . guaiFENesin-dextromethorphan (ROBITUSSIN DM) 100-10 MG/5ML syrup 10 mL  10 mL  Oral Q8H PRN Himabindu Ravi, MD   10 mL at 08/08/12 1518  . magnesium hydroxide (MILK OF MAGNESIA) suspension 30 mL  30 mL Oral Daily PRN Nanine Means, NP      . nicotine (NICODERM CQ - dosed in mg/24 hours) patch 21 mg  21 mg Transdermal Daily Himabindu Ravi, MD   21 mg at 08/10/12 1610  . risperiDONE (RISPERDAL) tablet 4 mg  4 mg Oral QHS Nanine Means, NP   4 mg at 08/09/12 2233  . simethicone (MYLICON) chewable tablet 80 mg  80 mg Oral QID PRN Sanjuana Kava, NP   80 mg at 08/10/12 0843  . traMADol (ULTRAM) tablet 100 mg  100 mg Oral Q6H PRN Nanine Means, NP   100 mg at  08/10/12 0842  . traZODone (DESYREL) tablet 100 mg  100 mg Oral QHS Sanjuana Kava, NP   100 mg at 08/09/12 2233    Lab Results:  No results found for this or any previous visit (from the past 48 hour(s)).  Physical Findings: AIMS: Facial and Oral Movements Muscles of Facial Expression: None, normal Lips and Perioral Area: None, normal Jaw: None, normal Tongue: None, normal,Extremity Movements Upper (arms, wrists, hands, fingers): None, normal Lower (legs, knees, ankles, toes): None, normal, Trunk Movements Neck, shoulders, hips: None, normal, Overall Severity Severity of abnormal movements (highest score from questions above): None, normal Incapacitation due to abnormal movements: None, normal Patient's awareness of abnormal movements (rate only patient's report): No Awareness, Dental Status Current problems with teeth and/or dentures?: No Does patient usually wear dentures?: No  CIWA:    COWS:     Treatment Plan Summary: Daily contact with patient to assess and evaluate symptoms and progress in treatment Medication management  Plan: Supportive approach/coping skills/relapse prevention. (1). Initiate Trazodone 100 mg Q bedtime for sleep, (2). Mylicon tabs 80 mg QID prn for gas/bloating.  Encouraged out of room, participation in group sessions and application of coping skills when distressed. Will continue to monitor response to/adverse effects of medications in use to assure effectiveness. Continue to monitor mood, behavior and interaction with staff and other patients. Continue current plan of care.  Medical Decision Making Problem Points:  Established problem, stable/improving (1), Review of last therapy session (1) and Review of psycho-social stressors (1) Data Points:  Review of medication regiment & side effects (2) Review of new medications or change in dosage (2)  I certify that inpatient services furnished can reasonably be expected to improve the patient's condition.    Oza Oberle,MICKIE D.RPA-C CAQ-Psych  08/10/2012, 11:44 AM

## 2012-08-10 NOTE — Progress Notes (Signed)
Psychoeducational Group Note  Date:  08/10/2012 Time: 1015  Group Topic/Focus:  Identifying Needs:   The focus of this group is to help patients identify their personal needs that have been historically problematic and identify healthy behaviors to address their needs.  Participation Level:  active Participation Quality: good Affect: flat Cognitive:    Insight:  good  Engagement in Group: engaged  Additional Comments: Pt was engaged in group, asked appropriate questions, shared personal life experience with the group and demonstrates willingness to process and understand her problems. PDuke RN [facility]  08/10/2012

## 2012-08-10 NOTE — Clinical Social Work Note (Signed)
[facility] Group Notes:  (Clinical Social Work)  08/10/2012   3:00-4:00PM  Summary of Progress/Problems:   The main focus of today's process group was for the patient to identify ways in which they have in the past sabotaged their own recovery and to discuss their motivation to change.  Due to commonalities in the group, the discussion instead was switched to discuss Grief & Loss, what to expect, what is normal, and how to hold hope for the future.  The patient expressed tearfully that she has experienced losses in her life, and how hard it is to keep going on.  She has been interested today in discharging, but was calm when told this would not happen before Monday.  Type of Therapy:  Process Group  Participation Level:  Active  Participation Quality:  Drowsy and Sharing  Affect:  Depressed and Flat  Cognitive:  Oriented  Insight:  Developing/Improving  Engagement in Therapy:  Developing/Improving  Modes of Intervention:  Clarification, Support and Processing, Exploration, Discussion   Ambrose Mantle, LCSW 08/10/2012, 4:24 PM

## 2012-08-10 NOTE — Progress Notes (Signed)
D) Pt has been attending the groups but will fall asleep in them frequently. Talked with Pt about the Xanax contributing to her sleepiness, but Pt states the medication has nothing to do with it. Later today Pt came back and said, "I believe that the Xanax is making me sleepy. I think I will just take it at night". Rates her depression and her hopelessness both at a 3 and denies SI and HI. Continues to get her pain medication every 6 hours for back pain. A) given support and reassurance. Encouraged to try to not take the Xanax during the day so she could get as much out of the groups as possible. R) Denies SI and HI.

## 2012-08-11 NOTE — Progress Notes (Signed)
Psychoeducational Group Note  Date:  08/11/2012 Time:  1015  Group Topic/Focus:  Making Healthy Choices:   The focus of this group is to help patients identify negative/unhealthy choices they were using prior to admission and identify positive/healthier coping strategies to replace them upon discharge.  Participation Level:  Active  Participation Quality:  Appropriate  Affect:  Appropriate  Cognitive:  Appropriate  Insight:  Engaged  Engagement in Group:  Engaged  Additional Comments:    Ekta Dancer A 08/11/2012 

## 2012-08-11 NOTE — Progress Notes (Signed)
Psychoeducational Group Note  Psychoeducational Group Note  Date: 08/11/2012 Time:  08/11/2012  Group Topic/Focus:  Gratefulness:  The focus of this group is to help patients identify what two things they are most grateful for in their lives. What helps ground them and to center them on their work to their recovery.  Participation Level:  Active  Participation Quality:  Appropriate  Affect:  Appropriate  Cognitive:  Appropriate  Insight:  Engaged  Engagement in Group:  Engaged  Additional Comments:    Dione Housekeeper
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
## 2012-08-11 NOTE — Clinical Social Work Note (Signed)
[facility] Group Notes:  (Clinical Social Work)  08/11/2012   3:00-4:00PM  Summary of Progress/Problems:   Summary of Progress/Problems:   The main focus of today's process group was to define "support" and describe what healthy supports are.  We then discussed how and why to increase patient supports, using motivational interviewing.  An emphasis was placed on using counselor, doctor, therapy groups, self-help groups and problem-specific support groups to expand supports.   The patient expressed little, as she was in and out of the group, worked on artwork while in group and was quite drowsy at times.  Type of Therapy:  Process Group  Participation Level:  Minimal  Participation Quality:  Drowsy and Inattentive  Affect:  Blunted  Cognitive:  Disorganized  Insight:  Improving  Engagement in Therapy:  Improving  Modes of Intervention:  Education,  Support and Processing, Exploration, Discussion   Ambrose Mantle, LCSW 08/11/2012, 4:36 PM

## 2012-08-12 MED ORDER — TRAZODONE HCL 100 MG PO TABS: 100.0000 mg | ORAL_TABLET | Freq: Every day | ORAL | Status: DC

## 2012-08-12 MED ORDER — RISPERIDONE 4 MG PO TABS: 4.0000 mg | ORAL_TABLET | Freq: Every day | ORAL | Status: DC

## 2012-08-12 MED ORDER — DIVALPROEX SODIUM ER 500 MG PO TB24: 1000.0000 mg | ORAL_TABLET | Freq: Every day | ORAL | Status: DC

## 2012-08-12 MED ORDER — GABAPENTIN 300 MG PO CAPS: 300.0000 mg | ORAL_CAPSULE | Freq: Three times a day (TID) | ORAL | Status: DC

## 2012-08-12 MED ORDER — FLUOXETINE HCL 40 MG PO CAPS: 40.0000 mg | ORAL_CAPSULE | Freq: Every day | ORAL | Status: DC

## 2012-08-12 MED ORDER — TRAMADOL HCL 50 MG PO TABS: 100.0000 mg | ORAL_TABLET | Freq: Four times a day (QID) | ORAL | Status: DC | PRN

## 2012-08-12 NOTE — [facility] Suicide Risk Assessment (Signed)
Suicide Risk Assessment  Discharge Assessment     Demographic Factors:  Female, caucasian, married  Mental Status Per Nursing Assessment::   On Admission:  Suicidal ideation indicated by patient  Current Mental Status by Physician: Patient alert and oriented to 4. Denies AH/VH/SI/HI.  Loss Factors: Financial problems/change in socioeconomic status  Historical Factors: Impulsivity  Risk Reduction Factors:   Living with another person, especially a relative and Positive social support  Continued Clinical Symptoms:  Alcohol/Substance Abuse/Dependencies  Cognitive Features That Contribute To Risk:  Thought constriction (tunnel vision)    Suicide Risk:  Minimal: No identifiable suicidal ideation.  Patients presenting with no risk factors but with morbid ruminations; may be classified as minimal risk based on the severity of the depressive symptoms  Discharge Diagnoses:   AXIS I:  Depressive Disorder NOS, Post Traumatic Stress Disorder and Substance Abuse AXIS II:  Deferred AXIS III:   Past Medical History  Diagnosis Date  . Seizures   . 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 Depression   . Heart murmur   . Migraine    AXIS IV:  economic problems, housing problems and other psychosocial or environmental problems AXIS V:  61-70 mild symptoms  Plan Of Care/Follow-up recommendations:  Activity:  regular Diet:  Regular Follow up with outpatient appointments in [place].  Is patient on multiple antipsychotic therapies at discharge:  No   Has Patient had three or more failed trials of antipsychotic monotherapy by history:  No  Recommended Plan for Multiple Antipsychotic Therapies: NA  Macaila Tahir 08/12/2012, 10:10 AM

## 2012-08-12 NOTE — Progress Notes (Signed)
[facility] Adult Case Management Discharge Plan :  Will you be returning to the same living situation after discharge: Yes,  Patient returning to previous address for a few day.  She plans to relocate to her mother's home in [place] later in the week. At discharge, do you have transportation home?:Yes,  Patient to arrange transportation home. Do you have the ability to pay for your medications:Yes,  Patient has Medicaid.  Release of information consent forms completed and in the chart;  Patient's signature needed at discharge.  Patient to Follow up at: Follow-up Information   Follow up with [facility] On 08/13/2012. ([facility] at 12:30 for your 1:00 appointment with Marella Bile.  Unable to reschedule appointment due to office being closed on Friday and Monday.  Patient advised to call and reschedule appointment tomorrow.)    Contact information:   [address]  [place]  [phone]      Patient denies SI/HI:   Yes,  Patient is not endorsing SI/HI or thoughts of self harm.    Safety Planning and Suicide Prevention discussed:  Yes,  Reviewed during aftercare groups.  [name] 08/12/2012, 12:49 PM

## 2012-08-12 NOTE — Progress Notes (Signed)
I certify that inpatient services furnished can reasonably be expected to improve the patient's condition. Nyree Applegate, MD, MSPH  

## 2012-08-12 NOTE — Progress Notes (Addendum)
D:  Patient's self inventory sheet, patient sleeps well, has good appetite, high energy level, good attention span.  SI denied.  Has chronic back pain.   Worst pain #8.  Keep my doctor appointments, take my meds, and use my coping skills.  No questions for staff.  Does have discharge plans.  No problems taking meds. A:  Medications given per MD order.    R:  Safety continues to be monitored every 15 minutes for safety.
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
## 2012-08-12 NOTE — Progress Notes (Signed)
Adult Psychoeducational Group Note  Date:  08/12/2012 Time:  12:37 PM  Group Topic/Focus:  Three minute breathing exercise with CD. Group talked about their experience with relaxtion, meditation and breathing exercises. We did the activity and then talked about the experience. Pts able to talk about coping techniques that worked and ones that did not.   Participation Level:  Active  Participation Quality:  Drowsy  Affect:  Blunted  Cognitive:  Oriented  Insight: Improving  Engagement in Group:  Engaged  Modes of Intervention:  Discussion, Education and Support  Additional Comments:  Pt seemed drowsy during group and fell asleep several times. But the pt was responsive and volunteered to read parts of the workbook.  Rosezella Kronick T 08/12/2012, 12:37 PM

## 2012-08-12 NOTE — Tx Team (Signed)
Interdisciplinary Treatment Plan Update (Adult)  Date:  08/12/2012  Time Reviewed:  10:10 AM   Progress in Treatment: Attending groups:   Yes   Participating in groups:  Yes Taking medication as prescribed:  Yes Tolerating medication:  Yes Family/Significant othe contact made: Contact made with family Patient understands diagnosis:  Yes Discussing patient identified problems/goals with staff: Yes Medical problems stabilized or resolved: Yes Denies suicidal/homicidal ideation: Yes Issues/concerns per patient self-inventory:  Other:   New problem(s) identified:  Reason for Continuation of Hospitalization:  Interventions implemented related to continuation of hospitalization:  Additional comments:  Estimated length of stay:  Discharge today  Discharge Plan:  Patient is relocating to live with mother in [place]  New goal(s):  Review of initial/current patient goals per problem list:    1.  Goal(s): Eliminate SI/other thoughts of self harm (Patient will no longer endorse SI/HI or thoughts of self harm)   Met: Yes  Target date: d/c  As evidenced by: Patient no longer endorsing SI/HI or thoughts of self harm    2.  Goal (s):Reduce depression/anxiety (Paitent will rate symptoms at four or below)  Met: Yes  Target date: d/c  As evidenced by: Patient rates symptoms at zero today  3.  Goal(s):.stabilize on meds (Patient will report being stabilized on medications - less symptomatic)   Met: Yes  Target date: d/c  As evidenced by: Patient reports being stable on medications    4.  Goal(s): Refer for outpatient follow up (Follow up appointment will be scheduled)   Met:  No  Target date: d/c  As evidenced by: Follow up appointment scheduled in [place]    Attendees: Patient:  [name] 08/12/2012 10:10 AM  Physican:  Patrick North, MD 08/12/2012 10:10 AM  Nursing:    Neill Loft, RN 08/12/2012 10:10 AM   Nursing:     Quintella Reichert, RN  08/12/2012 10:10 AM   Clinical Social Worker:  Juline Patch, LCSW 08/12/2012 10:10 AM   Other: Tera Helper, PHM-NP 08/12/2012 10:10 AM   Other:  Alger Simons, Transition Team Coordinator 08/12/2012 10:10 AM Other:        08/12/2012 10:10 AM

## 2012-08-12 NOTE — Progress Notes (Signed)
[facility] LCSW Aftercare Discharge Planning Group Note  08/12/2012 12:52 PM  Participation Quality:  Appropriate  Affect:  Appropriate  Cognitive:  Appropriate  Insight:  Engaged  Engagement in Group:  Engaged  Modes of Intervention:  Exploration, Problem-solving, Rapport Building and Support  Summary of Progress/Problems:  Patient reports being much better but reports being sleepy.  She stated she is safe and ready for discharge today.  She rated depression at one and anxiety at zero.  Daily workbook provided.   [name] 08/12/2012, 12:52 PM

## 2012-08-12 NOTE — Progress Notes (Signed)
I certify that inpatient services furnished can reasonably be expected to improve the patient's condition. Princella Jaskiewicz, MD, MSPH  

## 2012-08-12 NOTE — Progress Notes (Signed)
Patient came to medication window and requested medication to aid with constipation and gas which she received. Patient reports that she is starting to have the same problem the last time she was here with constipation and swelling. Writer encouraged patient to drink plenty of fluids and replenished her pitcher with ice water. Patient c/o back pain and received ultram for pain she rated an 8, will monitor effectiveness of medication. Patient denies si/hi/a/v hallucinations. Safety maintained on unit with 15 min checks, will continue to monitor.
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
## 2012-08-14 NOTE — Discharge Summary (Signed)
Reviewed

## 2012-08-16 NOTE — Progress Notes (Signed)
Patient Discharge Instructions:  After Visit Summary (AVS):   Faxed to:  08/16/12 Discharge Summary Note:   Faxed to:  08/16/12 Psychiatric Admission Assessment Note:   Faxed to:  08/16/12 Suicide Risk Assessment - Discharge Assessment:   Faxed to:  08/16/12 Faxed/Sent to the Next Level Care provider:  08/16/12 Faxed to [facility] @ [phone]  Jerelene Redden, 08/16/2012, 3:52 PM

## 2014-04-27 ENCOUNTER — Encounter (HOSPITAL_COMMUNITY): Payer: Self-pay | Admitting: Intensive Care

## 2017-08-06 ENCOUNTER — Encounter (HOSPITAL_COMMUNITY): Payer: Self-pay | Admitting: Emergency Medicine

## 2017-08-06 ENCOUNTER — Emergency Department (HOSPITAL_COMMUNITY)
Admission: EM | Admit: 2017-08-06 | Discharge: 2017-08-08 | Disposition: A | Discharge status: Other Acute Inpatient Hospital | Payer: Self-pay | Attending: Emergency Medicine | Admitting: Emergency Medicine

## 2017-08-06 ENCOUNTER — Other Ambulatory Visit: Payer: Self-pay

## 2017-08-06 DIAGNOSIS — F10920 Alcohol use, unspecified with intoxication, uncomplicated: Secondary | ICD-10-CM

## 2017-08-06 DIAGNOSIS — R45851 Suicidal ideations: Secondary | ICD-10-CM | POA: Insufficient documentation

## 2017-08-06 DIAGNOSIS — F411 Generalized anxiety disorder: Secondary | ICD-10-CM | POA: Insufficient documentation

## 2017-08-06 DIAGNOSIS — F17210 Nicotine dependence, cigarettes, uncomplicated: Secondary | ICD-10-CM | POA: Insufficient documentation

## 2017-08-06 DIAGNOSIS — F332 Major depressive disorder, recurrent severe without psychotic features: Secondary | ICD-10-CM | POA: Insufficient documentation

## 2017-08-06 DIAGNOSIS — Z79899 Other long term (current) drug therapy: Secondary | ICD-10-CM | POA: Insufficient documentation

## 2017-08-06 LAB — BASIC METABOLIC PANEL
Anion gap: 12 (ref 5–15)
BUN: 20 mg/dL (ref 6–20)
CO2: 18 mmol/L — ABNORMAL LOW (ref 22–32)
Calcium: 8 mg/dL — ABNORMAL LOW (ref 8.9–10.3)
Chloride: 105 mmol/L (ref 101–111)
Creatinine, Ser: 0.73 mg/dL (ref 0.44–1.00)
GFR calc Af Amer: >60 mL/min (ref >60)
GLUCOSE: 103 mg/dL — AB (ref 65–99)
POTASSIUM: 3.6 mmol/L (ref 3.5–5.1)
Sodium: 135 mmol/L (ref 135–145)

## 2017-08-06 LAB — CBC WITH DIFFERENTIAL/PLATELET
BASOS ABS: 0 10*3/uL (ref 0.0–0.1)
BASOS PCT: 1 %
EOS PCT: 2 %
Eosinophils Absolute: 0.1 10*3/uL (ref 0.0–0.7)
HCT: 43.4 % (ref 36.0–46.0)
Hemoglobin: 15 g/dL (ref 12.0–15.0)
LYMPHS PCT: 34 %
Lymphs Abs: 2.5 10*3/uL (ref 0.7–4.0)
MCH: 31.4 pg (ref 26.0–34.0)
MCHC: 34.6 g/dL (ref 30.0–36.0)
MCV: 90.8 fL (ref 78.0–100.0)
Monocytes Absolute: 0.3 10*3/uL (ref 0.1–1.0)
Monocytes Relative: 5 %
NEUTROS ABS: 4.3 10*3/uL (ref 1.7–7.7)
Neutrophils Relative %: 58 %
PLATELETS: 283 10*3/uL (ref 150–400)
RBC: 4.78 MIL/uL (ref 3.87–5.11)
RDW: 13.7 % (ref 11.5–15.5)
WBC: 7.2 10*3/uL (ref 4.0–10.5)

## 2017-08-06 LAB — RAPID URINE DRUG SCREEN, HOSP PERFORMED
AMPHETAMINES: NOT DETECTED
BARBITURATES: NOT DETECTED
Benzodiazepines: NOT DETECTED
Cocaine: NOT DETECTED
Opiates: NOT DETECTED
Tetrahydrocannabinol: NOT DETECTED

## 2017-08-06 LAB — ETHANOL: ALCOHOL ETHYL (B): 275 mg/dL — AB (ref <10)

## 2017-08-06 LAB — PREGNANCY, URINE: Preg Test, Ur: NEGATIVE
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
## 2017-08-06 NOTE — ED Notes (Signed)
Pt in paper scrubs, wanded by security, belongings placed in secured locker

## 2017-08-07 MED ORDER — OXYCODONE-ACETAMINOPHEN 5-325 MG PO TABS
1.0000 | ORAL_TABLET | ORAL | Status: DC | PRN
  Administered 2017-08-07 – 2017-08-08 (×4): 1 via ORAL
  Filled 2017-08-07 (×4): qty 1

## 2017-08-07 MED ORDER — ALUM & MAG HYDROXIDE-SIMETH 200-200-20 MG/5ML PO SUSP: 30.0000 mL | Freq: Four times a day (QID) | ORAL | Status: DC | PRN

## 2017-08-07 MED ORDER — FLUOXETINE HCL 20 MG PO CAPS
40.0000 mg | ORAL_CAPSULE | Freq: Every day | ORAL | Status: DC
Start: 2017-08-07 — End: 2017-08-08
  Administered 2017-08-07 – 2017-08-08 (×2): 40 mg via ORAL
  Filled 2017-08-07 (×2): qty 2

## 2017-08-07 MED ORDER — RISPERIDONE 1 MG PO TABS
4.0000 mg | ORAL_TABLET | Freq: Every day | ORAL | Status: DC
  Administered 2017-08-07 (×2): 4 mg via ORAL
  Filled 2017-08-07: qty 2
  Filled 2017-08-07: qty 4
  Filled 2017-08-07: qty 2
  Filled 2017-08-07: qty 4

## 2017-08-07 MED ORDER — ALBUTEROL SULFATE HFA 108 (90 BASE) MCG/ACT IN AERS
2.0000 | INHALATION_SPRAY | Freq: Four times a day (QID) | RESPIRATORY_TRACT | Status: DC | PRN
Start: 2017-08-07 — End: 2017-08-08

## 2017-08-07 MED ORDER — RISPERIDONE 0.5 MG PO TABS
ORAL_TABLET | ORAL | Status: AC
  Filled 2017-08-07: qty 2

## 2017-08-07 MED ORDER — OXYCODONE HCL 5 MG PO TABS
5.0000 mg | ORAL_TABLET | ORAL | Status: DC | PRN
  Administered 2017-08-07 – 2017-08-08 (×4): 5 mg via ORAL
  Filled 2017-08-07 (×4): qty 1

## 2017-08-07 MED ORDER — GABAPENTIN 400 MG PO CAPS
800.0000 mg | ORAL_CAPSULE | Freq: Three times a day (TID) | ORAL | Status: DC
  Administered 2017-08-07 – 2017-08-08 (×5): 800 mg via ORAL
  Filled 2017-08-07 (×5): qty 2

## 2017-08-07 MED ORDER — OXYCODONE-ACETAMINOPHEN 10-325 MG PO TABS
1.0000 | ORAL_TABLET | ORAL | Status: DC | PRN
Start: 2017-08-07 — End: 2017-08-07

## 2017-08-07 MED ORDER — RISPERIDONE 1 MG PO TABS
ORAL_TABLET | ORAL | Status: AC
  Filled 2017-08-07: qty 3

## 2017-08-07 MED ORDER — ALPRAZOLAM 0.5 MG PO TABS
1.0000 mg | ORAL_TABLET | Freq: Three times a day (TID) | ORAL | Status: DC | PRN
  Administered 2017-08-07 – 2017-08-08 (×3): 1 mg via ORAL
  Filled 2017-08-07 (×4): qty 2

## 2017-08-07 MED ORDER — PROMETHAZINE HCL 12.5 MG PO TABS
25.0000 mg | ORAL_TABLET | Freq: Four times a day (QID) | ORAL | Status: DC | PRN
  Administered 2017-08-07 (×2): 25 mg via ORAL
  Filled 2017-08-07 (×2): qty 2

## 2017-08-07 MED ORDER — ALBUTEROL SULFATE (2.5 MG/3ML) 0.083% IN NEBU: 2.5000 mg | INHALATION_SOLUTION | Freq: Four times a day (QID) | RESPIRATORY_TRACT | Status: DC | PRN

## 2017-08-07 MED ORDER — ONDANSETRON HCL 4 MG PO TABS
4.0000 mg | ORAL_TABLET | Freq: Three times a day (TID) | ORAL | Status: DC | PRN
  Administered 2017-08-07: 4 mg via ORAL
  Filled 2017-08-07: qty 1

## 2017-08-07 MED ORDER — NICOTINE 14 MG/24HR TD PT24
14.0000 mg | MEDICATED_PATCH | Freq: Every day | TRANSDERMAL | Status: DC
  Administered 2017-08-07 – 2017-08-08 (×2): 14 mg via TRANSDERMAL
  Filled 2017-08-07 (×2): qty 1

## 2017-08-07 MED ORDER — ACETAMINOPHEN 325 MG PO TABS
650.0000 mg | ORAL_TABLET | ORAL | Status: DC | PRN
Start: 2017-08-07 — End: 2017-08-08
  Administered 2017-08-07 (×2): 650 mg via ORAL
  Filled 2017-08-07 (×2): qty 2

## 2017-08-07 MED ORDER — DIVALPROEX SODIUM ER 500 MG PO TB24
1000.0000 mg | ORAL_TABLET | Freq: Two times a day (BID) | ORAL | Status: DC
Start: 2017-08-07 — End: 2017-08-08
  Administered 2017-08-07 – 2017-08-08 (×4): 1000 mg via ORAL
  Filled 2017-08-07 (×4): qty 2

## 2017-08-07 MED ORDER — ROPINIROLE HCL 1 MG PO TABS
ORAL_TABLET | ORAL | Status: AC
  Filled 2017-08-07: qty 4

## 2017-08-07 NOTE — ED Notes (Signed)
Pt states she forgot to report to pharmacy tech that she takes Xanax 1mg  3xday, added to pt home med list

## 2017-08-07 NOTE — ED Notes (Signed)
Pt requesting to shower. Pt escorted to shower with sitter.

## 2017-08-07 NOTE — ED Notes (Addendum)
Pt states no relief with Tylenol.

## 2017-08-07 NOTE — BH Assessment (Signed)
Pt is cooperative and oriented x 4. Her affect is depressed. Pt says she only slept two to three hours last night. She reports poor appetite due to nausea. Pt endorses SI. She says, "Nobody cares about me." She denies HI. She endorses [facility]. Pt says she sometimes hears her adult childrens' voices calling for her  but there is no one there. She says she often sees her deceased son and talks to him.  Patient continues to meet criteria for inpatient treatment.  Evette Cristalaroline Paige Chinelo Benn, [place] Therapeutic Triage Specialist

## 2017-08-07 NOTE — ED Notes (Signed)
Visitor at bedside. Pt calm and cooperative

## 2017-08-08 ENCOUNTER — Other Ambulatory Visit: Payer: Self-pay

## 2017-08-08 ENCOUNTER — Inpatient Hospital Stay (HOSPITAL_COMMUNITY)
Admission: AD | Admit: 2017-08-08 | Discharge: 2017-08-13 | DRG: 897 | Disposition: A | Discharge status: Home / Self Care | Payer: Federal, State, Local not specified - Other | Source: Intra-hospital | Attending: Psychiatry | Admitting: Psychiatry

## 2017-08-08 ENCOUNTER — Encounter (HOSPITAL_COMMUNITY): Payer: Self-pay

## 2017-08-08 DIAGNOSIS — Z8249 Family history of ischemic heart disease and other diseases of the circulatory system: Secondary | ICD-10-CM | POA: Diagnosis not present

## 2017-08-08 DIAGNOSIS — F17210 Nicotine dependence, cigarettes, uncomplicated: Secondary | ICD-10-CM | POA: Diagnosis present

## 2017-08-08 DIAGNOSIS — F419 Anxiety disorder, unspecified: Secondary | ICD-10-CM | POA: Diagnosis present

## 2017-08-08 DIAGNOSIS — K5900 Constipation, unspecified: Secondary | ICD-10-CM | POA: Diagnosis present

## 2017-08-08 DIAGNOSIS — Z8349 Family history of other endocrine, nutritional and metabolic diseases: Secondary | ICD-10-CM | POA: Diagnosis not present

## 2017-08-08 DIAGNOSIS — Z833 Family history of diabetes mellitus: Secondary | ICD-10-CM

## 2017-08-08 DIAGNOSIS — Z823 Family history of stroke: Secondary | ICD-10-CM | POA: Diagnosis not present

## 2017-08-08 DIAGNOSIS — F332 Major depressive disorder, recurrent severe without psychotic features: Secondary | ICD-10-CM | POA: Diagnosis present

## 2017-08-08 DIAGNOSIS — R450 Nervousness: Secondary | ICD-10-CM | POA: Diagnosis not present

## 2017-08-08 DIAGNOSIS — Y908 Blood alcohol level of 240 mg/100 ml or more: Secondary | ICD-10-CM | POA: Diagnosis not present

## 2017-08-08 DIAGNOSIS — F1994 Other psychoactive substance use, unspecified with psychoactive substance-induced mood disorder: Principal | ICD-10-CM

## 2017-08-08 DIAGNOSIS — Z23 Encounter for immunization: Secondary | ICD-10-CM

## 2017-08-08 DIAGNOSIS — R45851 Suicidal ideations: Secondary | ICD-10-CM | POA: Diagnosis present

## 2017-08-08 DIAGNOSIS — F1014 Alcohol abuse with alcohol-induced mood disorder: Secondary | ICD-10-CM | POA: Diagnosis not present

## 2017-08-08 LAB — VALPROIC ACID LEVEL: VALPROIC ACID LVL: 98 ug/mL (ref 50.0–100.0)

## 2017-08-08 MED ORDER — GABAPENTIN 400 MG PO CAPS
800.0000 mg | ORAL_CAPSULE | Freq: Three times a day (TID) | ORAL | Status: DC
  Filled 2017-08-08 (×2): qty 2

## 2017-08-08 MED ORDER — CHLORDIAZEPOXIDE HCL 25 MG PO CAPS
25.0000 mg | ORAL_CAPSULE | Freq: Every day | ORAL | Status: AC
  Administered 2017-08-12: 25 mg via ORAL
  Filled 2017-08-08: qty 1

## 2017-08-08 MED ORDER — HYDROXYZINE HCL 25 MG PO TABS
25.0000 mg | ORAL_TABLET | Freq: Four times a day (QID) | ORAL | Status: AC | PRN
  Administered 2017-08-08 – 2017-08-11 (×6): 25 mg via ORAL
  Filled 2017-08-08 (×6): qty 1

## 2017-08-08 MED ORDER — CHLORDIAZEPOXIDE HCL 25 MG PO CAPS
25.0000 mg | ORAL_CAPSULE | Freq: Four times a day (QID) | ORAL | Status: AC | PRN
  Filled 2017-08-08: qty 1

## 2017-08-08 MED ORDER — GABAPENTIN 400 MG PO CAPS
800.0000 mg | ORAL_CAPSULE | Freq: Four times a day (QID) | ORAL | Status: DC | PRN
  Administered 2017-08-08 – 2017-08-13 (×10): 800 mg via ORAL
  Filled 2017-08-08 (×7): qty 2
  Filled 2017-08-08: qty 24
  Filled 2017-08-08 (×4): qty 2

## 2017-08-08 MED ORDER — VITAMIN B-1 100 MG PO TABS
100.0000 mg | ORAL_TABLET | Freq: Every day | ORAL | Status: DC
  Administered 2017-08-09 – 2017-08-13 (×5): 100 mg via ORAL
  Filled 2017-08-08 (×6): qty 1

## 2017-08-08 MED ORDER — ADULT MULTIVITAMIN W/MINERALS CH
1.0000 | ORAL_TABLET | Freq: Every day | ORAL | Status: DC
  Administered 2017-08-09 – 2017-08-13 (×5): 1 via ORAL
  Filled 2017-08-08 (×7): qty 1

## 2017-08-08 MED ORDER — INFLUENZA VAC SPLIT QUAD 0.5 ML IM SUSY
0.5000 mL | PREFILLED_SYRINGE | INTRAMUSCULAR | Status: AC
  Administered 2017-08-09: 0.5 mL via INTRAMUSCULAR
  Filled 2017-08-08: qty 0.5

## 2017-08-08 MED ORDER — ONDANSETRON 4 MG PO TBDP
4.0000 mg | ORAL_TABLET | Freq: Four times a day (QID) | ORAL | Status: AC | PRN
Start: 2017-08-08 — End: 2017-08-11

## 2017-08-08 MED ORDER — CHLORDIAZEPOXIDE HCL 25 MG PO CAPS
25.0000 mg | ORAL_CAPSULE | Freq: Three times a day (TID) | ORAL | Status: AC
  Administered 2017-08-10 (×3): 25 mg via ORAL
  Filled 2017-08-08 (×3): qty 1

## 2017-08-08 MED ORDER — FLUOXETINE HCL 20 MG PO CAPS
40.0000 mg | ORAL_CAPSULE | Freq: Every day | ORAL | Status: DC
Start: 2017-08-09 — End: 2017-08-09
  Administered 2017-08-09: 40 mg via ORAL
  Filled 2017-08-08 (×3): qty 2

## 2017-08-08 MED ORDER — NICOTINE 14 MG/24HR TD PT24
14.0000 mg | MEDICATED_PATCH | Freq: Every day | TRANSDERMAL | Status: DC
  Administered 2017-08-09: 14 mg via TRANSDERMAL
  Filled 2017-08-08 (×2): qty 1

## 2017-08-08 MED ORDER — MIRTAZAPINE 15 MG PO TABS
15.0000 mg | ORAL_TABLET | Freq: Every evening | ORAL | Status: DC | PRN
  Administered 2017-08-08 – 2017-08-12 (×7): 15 mg via ORAL
  Filled 2017-08-08 (×17): qty 1

## 2017-08-08 MED ORDER — CHLORDIAZEPOXIDE HCL 25 MG PO CAPS
25.0000 mg | ORAL_CAPSULE | ORAL | Status: AC
  Administered 2017-08-11 (×2): 25 mg via ORAL
  Filled 2017-08-08 (×2): qty 1

## 2017-08-08 MED ORDER — GABAPENTIN 800 MG PO TABS: 800.0000 mg | ORAL_TABLET | Freq: Three times a day (TID) | ORAL | Status: DC

## 2017-08-08 MED ORDER — LOPERAMIDE HCL 2 MG PO CAPS: 2.0000 mg | ORAL_CAPSULE | ORAL | Status: AC | PRN

## 2017-08-08 MED ORDER — THIAMINE HCL 100 MG/ML IJ SOLN
100.0000 mg | Freq: Once | INTRAMUSCULAR | Status: AC
  Administered 2017-08-08: 100 mg via INTRAMUSCULAR
  Filled 2017-08-08: qty 2

## 2017-08-08 MED ORDER — PNEUMOCOCCAL VAC POLYVALENT 25 MCG/0.5ML IJ INJ
0.5000 mL | INJECTION | INTRAMUSCULAR | Status: AC
  Administered 2017-08-09: 0.5 mL via INTRAMUSCULAR

## 2017-08-08 MED ORDER — MAGNESIUM HYDROXIDE 400 MG/5ML PO SUSP
30.0000 mL | Freq: Every day | ORAL | Status: DC | PRN
Start: 2017-08-08 — End: 2017-08-13
  Administered 2017-08-09 – 2017-08-10 (×2): 30 mL via ORAL
  Filled 2017-08-08 (×2): qty 30

## 2017-08-08 MED ORDER — PROMETHAZINE HCL 12.5 MG PO TABS: 25.0000 mg | ORAL_TABLET | Freq: Four times a day (QID) | ORAL | Status: DC | PRN

## 2017-08-08 MED ORDER — CHLORDIAZEPOXIDE HCL 25 MG PO CAPS
25.0000 mg | ORAL_CAPSULE | Freq: Four times a day (QID) | ORAL | Status: AC
  Administered 2017-08-08 – 2017-08-09 (×6): 25 mg via ORAL
  Filled 2017-08-08 (×6): qty 1
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
## 2017-08-08 NOTE — Progress Notes (Signed)
[facility] Norma SievertSpencer Simon, PA is the accepting provider.  Dr. Emeline DarlingIze is the attending provider.  Call report to [phone]  [facility] notified.   Pt is Voluntary.  Pt may be transported by [facility]  Pt [facility]  [facility] 12:30 and 1 PM  Carney BernJean T. Kaylyn LimSutter, MSW, LCSWA Disposition Clinical Social Work [phone] (cell) [phone] (office) .

## 2017-08-08 NOTE — ED Notes (Signed)
[facility] at [facility].

## 2017-08-08 NOTE — Tx Team (Signed)
Initial Treatment Plan 08/08/2017 4:31 PM Norma DeistMarie J Pincus [id number]    PATIENT STRESSORS: Financial difficulties Marital or family conflict Substance abuse   PATIENT STRENGTHS: Wellsite geologistCommunication skills General fund of knowledge Work skills   PATIENT IDENTIFIED PROBLEMS: Depression  Suicidal ideation  Substance abuse  "I want to know that I'm not going to feel this way again"  "I want to be happy, not mad all the time"             DISCHARGE CRITERIA:  Improved stabilization in mood, thinking, and/or behavior Verbal commitment to aftercare and medication compliance Withdrawal symptoms are absent or subacute and managed without 24-hour nursing intervention  PRELIMINARY DISCHARGE PLAN: Outpatient therapy Medication management  PATIENT/FAMILY INVOLVEMENT: This treatment plan has been presented to and reviewed with the patient, Norma DeistMarie J Shasteen.  The patient and family have been given the opportunity to ask questions and make suggestions.  Levin BaconHeather V Chondra Boyde, RN 08/08/2017, 4:31 PM

## 2017-08-08 NOTE — Progress Notes (Signed)
Report received from admitting nurse. Patient denies SI/HI/AVH and pain at this time. Orders received and acknowledged.  Medications administered as per order. Patient verbally contracts for safety on the unit and agrees to come to staff before acting on any self harm thoughts/feelings and other needs or concerns. 15 min checks initiated for safety and patient oriented to the unit and the unit schedule. 

## 2017-08-09 DIAGNOSIS — Y908 Blood alcohol level of 240 mg/100 ml or more: Secondary | ICD-10-CM

## 2017-08-09 DIAGNOSIS — Z8659 Personal history of other mental and behavioral disorders: Secondary | ICD-10-CM

## 2017-08-09 DIAGNOSIS — R45851 Suicidal ideations: Secondary | ICD-10-CM

## 2017-08-09 DIAGNOSIS — F332 Major depressive disorder, recurrent severe without psychotic features: Secondary | ICD-10-CM

## 2017-08-09 DIAGNOSIS — F1014 Alcohol abuse with alcohol-induced mood disorder: Secondary | ICD-10-CM

## 2017-08-09 DIAGNOSIS — Z590 Homelessness: Secondary | ICD-10-CM

## 2017-08-09 DIAGNOSIS — R440 Auditory hallucinations: Secondary | ICD-10-CM

## 2017-08-09 DIAGNOSIS — R062 Wheezing: Secondary | ICD-10-CM

## 2017-08-09 DIAGNOSIS — F1994 Other psychoactive substance use, unspecified with psychoactive substance-induced mood disorder: Secondary | ICD-10-CM

## 2017-08-09 DIAGNOSIS — F17210 Nicotine dependence, cigarettes, uncomplicated: Secondary | ICD-10-CM

## 2017-08-09 DIAGNOSIS — R454 Irritability and anger: Secondary | ICD-10-CM

## 2017-08-09 MED ORDER — LORAZEPAM 1 MG PO TABS
1.0000 mg | ORAL_TABLET | Freq: Once | ORAL | Status: AC
  Administered 2017-08-09: 1 mg via ORAL
  Filled 2017-08-09: qty 1

## 2017-08-09 MED ORDER — FLUOXETINE HCL 10 MG PO CAPS
10.0000 mg | ORAL_CAPSULE | Freq: Every day | ORAL | Status: DC
  Administered 2017-08-10: 10 mg via ORAL
  Filled 2017-08-09 (×2): qty 1

## 2017-08-09 MED ORDER — GI COCKTAIL ~~LOC~~
30.0000 mL | Freq: Once | ORAL | Status: AC
  Administered 2017-08-09: 30 mL via ORAL
  Filled 2017-08-09: qty 30

## 2017-08-09 MED ORDER — NICOTINE 21 MG/24HR TD PT24
21.0000 mg | MEDICATED_PATCH | Freq: Every day | TRANSDERMAL | Status: DC
  Administered 2017-08-10 – 2017-08-13 (×5): 21 mg via TRANSDERMAL
  Filled 2017-08-09 (×5): qty 1

## 2017-08-09 MED ORDER — RISPERIDONE 1 MG PO TABS
1.0000 mg | ORAL_TABLET | Freq: Every day | ORAL | Status: DC
  Administered 2017-08-09 – 2017-08-11 (×3): 1 mg via ORAL
  Filled 2017-08-09 (×5): qty 1

## 2017-08-09 NOTE — Progress Notes (Signed)
Pt is new to the unit this afternoon.  She reports that she has been feeling more depressed to the point of having suicidal thoughts.  She is still having passive thoughts of suicide, but can contract for safety.  She denies HI/AVH at this time.  Writer spoke to pt about her medications and pt divulged that she had not been taking her meds as she should because she could not afford them. She cannot take Trazodone as it gives her vivid, disturbing dreams.  Writer discussed options with pt, then spoke to the evening provider who gave orders.  Writer also discussed the Librium protocol with the pt as it was ordered because of her alcohol abuse.  Pt voiced understanding and is agreeable to the med orders made this evening.  Pt became more relaxed after our conversation and took her meds as ordered.  Support and encouragement offered.  Discharge plans are in process.  Safety maintained with q15 minute checks.

## 2017-08-09 NOTE — [facility] Group Notes (Signed)
Pt was with Child psychotherapistsocial worker while group was in session. Pt came in group at the end.

## 2017-08-09 NOTE — Progress Notes (Signed)
Pt attend wrap up group. Her goal to get her medication regular. 

## 2017-08-09 NOTE — Progress Notes (Signed)
Pt presents with a flat affect and anxious mood. Pt noted to be irritable, increasingly anxious, fidgety and med seeking on approach. During assessment pt repeatedly asked for additional or stronger anxiety meds. Pt stated that she needed a stronger dose of Librium and needed to be on something else for anxiety. Pt stated that she takes Xanax for anxiety. Pt stated that [place] Pt also admitted to buying Xanax from off the street. Pt reports increased depression 8/10 and Anxiety 10/10. Pt denies SI. MD made aware of pt complaints in tx team. Medications reviewed with pt. Verbal support provided. Pt encouraged to attend groups. 15 minute checks performed for safety.
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
## 2017-08-09 NOTE — [facility] Group Notes (Signed)
LCSW Group Therapy Note 08/09/2017 2:49 PM  Type of Therapy/Topic: Group Therapy: Balance in Life  Participation Level: Did Not Attend  Description of Group:  This group will address the concept of balance and how it feels and looks when one is unbalanced. Patients will be encouraged to process areas in their lives that are out of balance and identify reasons for remaining unbalanced. Facilitators will guide patients in utilizing problem-solving interventions to address and correct the stressor making their life unbalanced. Understanding and applying boundaries will be explored and addressed for obtaining and maintaining a balanced life. Patients will be encouraged to explore ways to assertively make their unbalanced needs known to significant others in their lives, using other group members and facilitator for support and feedback.  Therapeutic Goals: 1. Patient will identify two or more emotions or situations they have that consume much of in their lives. 2. Patient will identify signs/triggers that life has become out of balance:  3. Patient will identify two ways to set boundaries in order to achieve balance in their lives:  4. Patient will demonstrate ability to communicate their needs through discussion and/or role plays  Summary of Patient Progress:  Invited, chose not to attend.   Therapeutic Modalities:  Cognitive Behavioral Therapy Solution-Focused Therapy Assertiveness Training   Alcario DroughtJolan Chevon Fomby LCSWA Clinical Social Worker

## 2017-08-09 NOTE — [facility] Suicide Risk Assessment (Signed)
[facility] INPATIENT:  Family/Significant Other Suicide Prevention Education  Suicide Prevention Education:  Patient Refusal for Family/Significant Other Suicide Prevention Education: The patient [name] has refused to provide written consent for family/significant other to be provided Family/Significant Other Suicide Prevention Education during admission and/or prior to discharge.  Physician notified.  Metro Kungngel M Maximilian Tallo 08/09/2017, 10:12 AM
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
## 2017-08-09 NOTE — [facility] Group Notes (Signed)
Adult Psychoeducational Group Note  Date:  08/09/2017 Time:  9:15 AM  Group Topic/Focus:  Goals Group:   The focus of this group is to help patients establish daily goals to achieve during treatment and discuss how the patient can incorporate goal setting into their daily lives to aide in recovery.  Participation Level:  Active  Participation Quality:  Appropriate  Affect:  Appropriate  Cognitive:  Alert  Insight: Appropriate  Engagement in Group:  Engaged  Modes of Intervention:  Orientation  Additional Comments:  Pt participated in goals/orientation group. Pt goal for today is to stay positive.  [name] 08/09/2017, 9:15 AM
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
## 2017-08-10 DIAGNOSIS — F1994 Other psychoactive substance use, unspecified with psychoactive substance-induced mood disorder: Principal | ICD-10-CM

## 2017-08-10 DIAGNOSIS — K5900 Constipation, unspecified: Secondary | ICD-10-CM | POA: Diagnosis present

## 2017-08-10 MED ORDER — FLUOXETINE HCL 10 MG PO CAPS
10.0000 mg | ORAL_CAPSULE | Freq: Once | ORAL | Status: AC
  Administered 2017-08-10: 10 mg via ORAL
  Filled 2017-08-10: qty 1

## 2017-08-10 MED ORDER — BISACODYL 5 MG PO TBEC
5.0000 mg | DELAYED_RELEASE_TABLET | Freq: Every day | ORAL | Status: DC | PRN
  Administered 2017-08-10 – 2017-08-13 (×2): 5 mg via ORAL
  Filled 2017-08-10 (×3): qty 1

## 2017-08-10 MED ORDER — DIVALPROEX SODIUM 500 MG PO DR TAB
500.0000 mg | DELAYED_RELEASE_TABLET | Freq: Two times a day (BID) | ORAL | Status: DC
  Administered 2017-08-10 – 2017-08-13 (×7): 500 mg via ORAL
  Filled 2017-08-10 (×10): qty 1

## 2017-08-10 MED ORDER — POLYETHYLENE GLYCOL 3350 17 G PO PACK
17.0000 g | PACK | Freq: Every day | ORAL | Status: DC
  Administered 2017-08-10 – 2017-08-13 (×4): 17 g via ORAL
  Filled 2017-08-10 (×7): qty 1

## 2017-08-10 MED ORDER — FLUOXETINE HCL 20 MG PO CAPS
20.0000 mg | ORAL_CAPSULE | Freq: Every day | ORAL | Status: DC
  Administered 2017-08-11 – 2017-08-13 (×3): 20 mg via ORAL
  Filled 2017-08-10 (×5): qty 1

## 2017-08-10 MED ORDER — IBUPROFEN 800 MG PO TABS
800.0000 mg | ORAL_TABLET | Freq: Three times a day (TID) | ORAL | Status: DC | PRN
  Administered 2017-08-12 – 2017-08-13 (×2): 800 mg via ORAL
  Filled 2017-08-10 (×3): qty 1

## 2017-08-10 NOTE — Progress Notes (Signed)
D.  Pt pleasant but anxious on approach, denies complaints at this time.  Pt positive for evening wrap up group, observed interacting appropriately with peers on the unit.  Pt stated that last night she had restless leg but was unsure which medication might have caused it.  Pt denies SI/HI/AVH at this time.  A.  Support and encouragement offered, medication given as ordered  R. Pt remains safe on the unit, will continue to monitor.

## 2017-08-10 NOTE — BHH Group Notes (Signed)
Orientation / Goals   Date:  08/10/2017  Time:  9:32 AM  Type of Therapy:  Nurse Education  /  The group focuses on teaching patients who their staff is, what the staff responsibilities are and what the unit programming / scheduling looks like. Additionally, they are educated about SMART goal - setting.   Participation Level:  Active  Participation Quality:  Attentive  Affect:  Appropriate  Cognitive:  Alert  Insight:  Appropriate  Engagement in Group:  Engaged  Modes of Intervention:  Education  Summary of Progress/Problems:  [name], [name] 08/10/2017, 9:32 AM

## 2017-08-10 NOTE — Progress Notes (Signed)
Patient stated she had a large BM late afternoon.

## 2017-08-10 NOTE — BHH Group Notes (Signed)
BHH Group Notes:  (Nursing/MHT/Case Management/Adjunct)  Date:  08/10/2017  Time:  6:51 PM  Type of Therapy:  Nurse Education  Participation Level:  Active  Participation Quality:  Appropriate  Affect:  Appropriate  Cognitive:  Appropriate  Insight:  Appropriate  Engagement in Group:  Engaged  Modes of Intervention:  Discussion and Education  Summary of Progress/Problems:  Nurse discussed the SMART goal setting guide for coping skills and relapse prevention. Nurse also discussed the Relapse Prevention Plan Worksheet and encouraged all patients to fill the worksheet out, individually, thoughtfully and with the goal of staying heathy.   Almira Barenny G Edda Orea 08/10/2017, 6:51 PM

## 2017-08-10 NOTE — Progress Notes (Signed)
At the beginning of the shift, pt approached writer stating that she felt bloated and had not had a BM in two days.  She also stated that she had been given MOM earlier and was requesting another dose.  Pt was informed that she could not have any more MOM today, but that prune juice was available if she wanted to try it.  Pt did drink the prune juice and was able to have a small BM, but she asked if she could have another can of juice which she was given.  Then around 2200, pt sat down at the phone in front of the med window and told writer that she was having chest pain.  Vital signs were obtained and recorded which were WNL.  Writer informed the evening provider who ordered an EKG.  Pt had already received her evening meds.  EKG was performed and given to the provider.  EKG showed no abnormalities and was NSR.  Pt was given a GI cocktail, Ativan 1 mg, along with her second Remeron 15 mg.  Provider feels pt is feeling effects from her constipation and ordered meds accordingly.  Pt contracts for safety.  She denies HI/AVH.  She makes her needs known to staff.  She was medicated as ordered.  At this time, pt is in the bed asleep, snoring moderately.  No distress observed.  Safety maintained with q15 minute checks.

## 2017-08-10 NOTE — Progress Notes (Signed)
D:  Patient's self inventory sheet, patient has fair sleep, sleep medication helpful.  Fair appetite, normal energy level, good concentration.  Rated depression, hopeless and anxiety 7.  Withdrawals, chilling, cramping, agitation, nausea, runny nose, irritability.  Denied SI.  Physical problems, pain, back and head, no pain medicine.   A:  Medications administered per MD orders.  Emotional support and encouragement given patient. R:  Denied SI and HI, contracts for safety.  Stated she does hear voices "mama" and sees "shadows of dogs".   Patient has asked several times today to return to [address].  Stated she is not a drug addict.  That she just could not get to her MD to get her prescriptions and bought medications off the street and took her husband's xanax.  Patient stated she does not need to be on [address] for groups, that people are talking about dope, and she does not take dope.  NP informed of patient's statements. Also patient wants her family to come for dinner tonight.  NP informed of patient's requests.

## 2017-08-10 NOTE — BHH Group Notes (Signed)
LCSW Group Therapy Note 08/10/2017 12:54 PM  Type of Therapy and Topic: Group Therapy: Avoiding Self-Sabotaging and Enabling Behaviors  Participation Level: Active  Description of Group:  In this group, patients will learn how to identify obstacles, self-sabotaging and enabling behaviors, as well as: what are they, why do we do them and what needs these behaviors meet. Discuss unhealthy relationships and how to have positive healthy boundaries with those that sabotage and enable. Explore aspects of self-sabotage and enabling in yourself and how to limit these self-destructive behaviors in everyday life.  Therapeutic Goals: 1. Patient will identify one obstacle that relates to self-sabotage and enabling behaviors 2. Patient will identify one personal self-sabotaging or enabling behavior they did prior to admission 3. Patient will state a plan to change the above identified behavior 4. Patient will demonstrate ability to communicate their needs through discussion and/or role play.   Summary of Patient Progress:  [name] was engaged throughout the group session. She participated and contributed to the group's discussion regarding self-sabotaging and how to identify these behaviors. She also reports that she plans to formulate a plan of change for the future.   Therapeutic Modalities:  Cognitive Behavioral Therapy Person-Centered Therapy Motivational Interviewing   Baldo DaubJolan Myleah Cavendish LCSWA Clinical Social Worker

## 2017-08-10 NOTE — Progress Notes (Signed)
Psychoeducational Group Note  Date:  08/10/2017 Time:  1510  Group Topic/Focus:  Recovery Goals:   The focus of this group is to identify appropriate goals for recovery and establish a plan to achieve them.  Participation Level: Did Not Attend  Participation Quality:  Not Applicable  Affect:  Not Applicable  Cognitive:  Not Applicable  Insight:  Not Applicable  Engagement in Group: Not Applicable  Additional Comments:  Pt was not feeling well and as a result could not attend group this morning.  Elijio Staples E 08/10/2017, 3:19 PM

## 2017-08-10 NOTE — Progress Notes (Signed)
The patient attended the evening A.A. Meeting and was appropriate.  

## 2017-08-10 NOTE — Tx Team (Signed)
Interdisciplinary Treatment and Diagnostic Plan Update  08/10/2017 Time of Session: 10:00a [name] MRN: [id number]  Principal Diagnosis: Substance induced mood disorder ([place])  Secondary Diagnoses: Principal Problem:   Substance induced mood disorder ([place]) Active Problems:   MDD (major depressive disorder), recurrent severe, without psychosis ([place])   Current Medications:  Current Facility-Administered Medications  Medication Dose Route Frequency Provider Last Rate Last Dose  . chlordiazePOXIDE (LIBRIUM) capsule 25 mg  25 mg Oral Q6H PRN Nwoko, Agnes I, NP      . chlordiazePOXIDE (LIBRIUM) capsule 25 mg  25 mg Oral TID Lindell Spar I, NP   25 mg at 08/10/17 0753   Followed by  . [START ON 08/11/2017] chlordiazePOXIDE (LIBRIUM) capsule 25 mg  25 mg Oral BH-qamhs Lindell Spar I, NP       Followed by  . [START ON 08/12/2017] chlordiazePOXIDE (LIBRIUM) capsule 25 mg  25 mg Oral Daily Nwoko, Agnes I, NP      . FLUoxetine (PROZAC) capsule 10 mg  10 mg Oral Daily Izediuno, Laruth Bouchard, MD   10 mg at 08/10/17 0750  . gabapentin (NEURONTIN) capsule 800 mg  800 mg Oral QID PRN Lindell Spar I, NP   800 mg at 08/10/17 0754  . hydrOXYzine (ATARAX/VISTARIL) tablet 25 mg  25 mg Oral Q6H PRN Lindell Spar I, NP   25 mg at 08/09/17 2121  . loperamide (IMODIUM) capsule 2-4 mg  2-4 mg Oral PRN Nwoko, Agnes I, NP      . magnesium hydroxide (MILK OF MAGNESIA) suspension 30 mL  30 mL Oral Daily PRN Rankin, Shuvon B, NP   30 mL at 08/10/17 0758  . mirtazapine (REMERON) tablet 15 mg  15 mg Oral QHS,MR X 1 Laverle Hobby, PA-C   15 mg at 08/09/17 2236  . multivitamin with minerals tablet 1 tablet  1 tablet Oral Daily Lindell Spar I, NP   1 tablet at 08/10/17 0750  . nicotine (NICODERM CQ - dosed in mg/24 hours) patch 21 mg  21 mg Transdermal Daily Lindell Spar I, NP   21 mg at 08/10/17 0751  . ondansetron (ZOFRAN-ODT) disintegrating tablet 4 mg  4 mg Oral Q6H PRN Nwoko, Agnes I, NP      . risperiDONE  (RISPERDAL) tablet 1 mg  1 mg Oral QHS Izediuno, Laruth Bouchard, MD   1 mg at 08/09/17 2127  . thiamine (VITAMIN B-1) tablet 100 mg  100 mg Oral Daily Lindell Spar I, NP   100 mg at 08/10/17 4696   PTA Medications: Medications Prior to Admission  Medication Sig Dispense Refill Last Dose  . albuterol (PROVENTIL HFA;VENTOLIN HFA) 108 (90 BASE) MCG/ACT inhaler Inhale 2 puffs into the lungs every 6 (six) hours as needed. Shortness of Breath   08/06/2017 at Unknown time  . albuterol (PROVENTIL) (2.5 MG/3ML) 0.083% nebulizer solution Take 2.5 mg by nebulization every 6 (six) hours as needed for wheezing or shortness of breath.   unknown  . ALPRAZolam (XANAX) 1 MG tablet Take 1 mg by mouth 3 (three) times daily as needed for anxiety.   08/05/2017 at Unknown time  . aspirin-acetaminophen-caffeine (EXCEDRIN MIGRAINE) 250-250-65 MG per tablet Take 1-2 tablets by mouth daily as needed for migraine. For migraine    08/06/2017 at Unknown time  . divalproex (DEPAKOTE ER) 500 MG 24 hr tablet Take 2 tablets (1,000 mg total) by mouth daily. (Patient taking differently: Take 1,000 mg by mouth 2 (two) times daily. ) 60 tablet 0 08/06/2017 at [place]  .  EPINEPHrine (EPIPEN 2-PAK) 0.3 mg/0.3 mL DEVI Inject 0.3 mg into the muscle once. For bee stings   unknown  . FLUoxetine (PROZAC) 40 MG capsule Take 1 capsule (40 mg total) by mouth daily. 30 capsule 0 08/06/2017 at Unknown time  . gabapentin (NEURONTIN) 800 MG tablet Take 800 mg by mouth 3 (three) times daily.   08/06/2017 at 1400  . oxyCODONE-acetaminophen (PERCOCET) 10-325 MG tablet Take 1 tablet by mouth every 4 (four) hours as needed for pain. May take one tablet every 4 to 6 hours as needed for pain   Past Week at Unknown time  . promethazine (PHENERGAN) 25 MG tablet Take 25 mg by mouth every 6 (six) hours as needed for nausea or vomiting. Takes when Percocet 10-'325mg'$  is taken   Past Week at Unknown time  . risperiDONE (RISPERDAL) 4 MG tablet Take 1 tablet (4 mg total) by mouth  at bedtime. 30 tablet 0 08/05/2017 at Unknown time    Patient Stressors: Financial difficulties Marital or family conflict Substance abuse  Patient Strengths: Curator fund of knowledge Work skills  Treatment Modalities: Medication Management, Group therapy, Case management,  1 to 1 session with clinician, Psychoeducation, Recreational therapy.   Physician Treatment Plan for Primary Diagnosis: Substance induced mood disorder ([place]) Long Term Goal(s): Improvement in symptoms so as ready for discharge Improvement in symptoms so as ready for discharge   Short Term Goals: Ability to identify changes in lifestyle to reduce recurrence of condition will improve Ability to verbalize feelings will improve Ability to disclose and discuss suicidal ideas Ability to demonstrate self-control will improve Ability to identify and develop effective coping behaviors will improve Ability to maintain clinical measurements within normal limits will improve Compliance with prescribed medications will improve Ability to identify triggers associated with substance abuse/mental health issues will improve Ability to identify changes in lifestyle to reduce recurrence of condition will improve Ability to verbalize feelings will improve Ability to disclose and discuss suicidal ideas Ability to demonstrate self-control will improve Ability to identify and develop effective coping behaviors will improve Ability to maintain clinical measurements within normal limits will improve Compliance with prescribed medications will improve Ability to identify triggers associated with substance abuse/mental health issues will improve  Medication Management: Evaluate patient's response, side effects, and tolerance of medication regimen.  Therapeutic Interventions: 1 to 1 sessions, Unit Group sessions and Medication administration.  Evaluation of Outcomes: Not Met  Physician Treatment Plan for  Secondary Diagnosis: Principal Problem:   Substance induced mood disorder ([place]) Active Problems:   MDD (major depressive disorder), recurrent severe, without psychosis ([place])  Long Term Goal(s): Improvement in symptoms so as ready for discharge Improvement in symptoms so as ready for discharge   Short Term Goals: Ability to identify changes in lifestyle to reduce recurrence of condition will improve Ability to verbalize feelings will improve Ability to disclose and discuss suicidal ideas Ability to demonstrate self-control will improve Ability to identify and develop effective coping behaviors will improve Ability to maintain clinical measurements within normal limits will improve Compliance with prescribed medications will improve Ability to identify triggers associated with substance abuse/mental health issues will improve Ability to identify changes in lifestyle to reduce recurrence of condition will improve Ability to verbalize feelings will improve Ability to disclose and discuss suicidal ideas Ability to demonstrate self-control will improve Ability to identify and develop effective coping behaviors will improve Ability to maintain clinical measurements within normal limits will improve Compliance with prescribed medications will improve Ability  to identify triggers associated with substance abuse/mental health issues will improve     Medication Management: Evaluate patient's response, side effects, and tolerance of medication regimen.  Therapeutic Interventions: 1 to 1 sessions, Unit Group sessions and Medication administration.  Evaluation of Outcomes: Not Met   RN Treatment Plan for Primary Diagnosis: Substance induced mood disorder ([place]) Long Term Goal(s): Knowledge of disease and therapeutic regimen to maintain health will improve  Short Term Goals: Ability to remain free from injury will improve, Ability to verbalize frustration and anger appropriately will improve,  Ability to demonstrate self-control, Ability to participate in decision making will improve, Ability to verbalize feelings will improve, Ability to disclose and discuss suicidal ideas, Ability to identify and develop effective coping behaviors will improve and Compliance with prescribed medications will improve  Medication Management: RN will administer medications as ordered by provider, will assess and evaluate patient's response and provide education to patient for prescribed medication. RN will report any adverse and/or side effects to prescribing provider.  Therapeutic Interventions: 1 on 1 counseling sessions, Psychoeducation, Medication administration, Evaluate responses to treatment, Monitor vital signs and CBGs as ordered, Perform/monitor CIWA, COWS, AIMS and Fall Risk screenings as ordered, Perform wound care treatments as ordered.  Evaluation of Outcomes: Not Met   LCSW Treatment Plan for Primary Diagnosis: Substance induced mood disorder ([place]) Long Term Goal(s): Safe transition to appropriate next level of care at discharge, Engage patient in therapeutic group addressing interpersonal concerns.  Short Term Goals: Engage patient in aftercare planning with referrals and resources, Increase social support, Increase ability to appropriately verbalize feelings, Increase emotional regulation, Facilitate acceptance of mental health diagnosis and concerns, Facilitate patient progression through stages of change regarding substance use diagnoses and concerns, Identify triggers associated with mental health/substance abuse issues and Increase skills for wellness and recovery  Therapeutic Interventions: Assess for all discharge needs, 1 to 1 time with Social worker, Explore available resources and support systems, Assess for adequacy in community support network, Educate family and significant other(s) on suicide prevention, Complete Psychosocial Assessment, Interpersonal group therapy.  Evaluation  of Outcomes: Not Met   Progress in Treatment: Attending groups: No. Participating in groups: No. Taking medication as prescribed: Yes. Toleration medication: Yes. Family/Significant other contact made: No, will contact:  patient refused consent; CSW will assess for an appropriate contact, if patient consents Patient understands diagnosis: Yes. Discussing patient identified problems/goals with staff: Yes. Medical problems stabilized or resolved: Yes. Denies suicidal/homicidal ideation: Yes. Issues/concerns per patient self-inventory: No. Other:   New problem(s) identified: NONE   New Short Term/Long Term Goal(s):Detox, medication stabilization, elimination of SI thoughts, development of comprehensive mental wellness plan.    Patient Goal: "To get better"  Discharge Plan or Barriers: Return home and follow up with [facility]   Reason for Continuation of Hospitalization: Anxiety Depression Medication stabilization Suicidal ideation  Estimated Length of Stay: 08/14/17  Attendees: Patient: [name] 08/10/2017 8:44 AM  Physician: Dr. Leanord Hawking 08/10/2017 8:44 AM  Nursing: Rise Paganini, RN  08/10/2017 8:44 AM  RN Care Manager: 08/10/2017 8:44 AM  Social Worker: Radonna Ricker, [place] 08/10/2017 8:44 AM  Recreational Therapist:  08/10/2017 8:44 AM  Other:  08/10/2017 8:44 AM  Other:  08/10/2017 8:44 AM  Other: 08/10/2017 8:44 AM    Scribe for Treatment Team: Marylee Floras, [place] 08/10/2017 8:44 AM

## 2017-08-10 NOTE — Progress Notes (Signed)
[facility] MD Progress Note  08/10/2017 11:50 AM [name]  MRN:  [id number]   Subjective:  Patient reports that she is in need of something for constipation while she is here. She wants her Prozac increased as the dose she is on has not been working. She is requesting something added for pain even if it is Ibuprofen. She denies any medication side effects. She denies any SI/HI/AVH and contracts for safety. She continues her pain medications and her Xanax, but then states that she gets them form someone else and has no prescriptions for them. She is requesting some assistance with her medication costs when discharged.    Objective: Patient's chart and findings reviewed and discussed with treatment team. Patient presents I the hallway asking when I am seeing her. She has a flat affect and is direct and to the pont with her questions. She is started on Miralax 17 grams daily and Dulcolax. Her Depakote was added back today at 500 mg BID. She is prescribed Ibuprofen 800 mg Q8H PRN for pain and she is not allergic to Ibuprofen as she takes it at home. Her Prozac is increased to 20 mg Daily.   Principal Problem: Substance induced mood disorder (HCC) Diagnosis:   Patient Active Problem List   Diagnosis Date Noted  . Constipation [K59.00] 08/10/2017  . Substance induced mood disorder (HCC) [F19.94] 08/09/2017  . MDD (major depressive disorder), recurrent severe, without psychosis (HCC) [F33.2] 08/08/2017  . Depression [F32.9] 07/31/2012  . Anxiety [F41.9] 07/31/2012   Total Time spent with patient: 25 minutes  Past Psychiatric History: See H&P  Past Medical History:  Past Medical History:  Diagnosis Date  . Depression   . Heart murmur   . Migraine   . Seizures (HCC)   . 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 CESAREAN SECTION    . CESAREAN SECTION  1994 and 1998  . KNEE SURGERY    . KNEE SURGERY  2005 and 2006  . TUBAL LIGATION     Family  History:  Family History  Problem Relation Age of Onset  . Heart failure Mother   . Diabetes Mother   . Hyperlipidemia Mother   . Hypertension Mother   . Stroke Sister   . Seizures Sister    Family Psychiatric  History: See H&P Social History:  Social History   Substance and Sexual Activity  Alcohol Use Yes   Comment: occ     Social History   Substance and Sexual Activity  Drug Use No    Social History   Socioeconomic History  . Marital status: Married    Spouse name: None  . Number of children: None  . Years of education: None  . Highest education level: None  Social Needs  . Financial resource strain: None  . Food insecurity - worry: None  . Food insecurity - inability: None  . Transportation needs - medical: None  . Transportation needs - non-medical: None  Occupational History  . None  Tobacco Use  . Smoking status: Current Every Day Smoker    Packs/day: 1.00    Types: Cigarettes  . Smokeless tobacco: Never Used  Substance and Sexual Activity  . Alcohol use: Yes    Comment: occ  . Drug use: No  . Sexual activity: Yes    Birth control/protection: Other-see comments, Surgical  Other Topics Concern  . None  Social History Narrative  . None  Additional Social History:                         Sleep: Good  Appetite:  Good  Current Medications: Current Facility-Administered Medications  Medication Dose Route Frequency Provider Last Rate Last Dose  . bisacodyl (DULCOLAX) EC tablet 5 mg  5 mg Oral Daily PRN Domonick Sittner, Gerlene Burdockravis B, FNP   5 mg at 08/10/17 1057  . chlordiazePOXIDE (LIBRIUM) capsule 25 mg  25 mg Oral Q6H PRN Nwoko, Agnes I, NP      . chlordiazePOXIDE (LIBRIUM) capsule 25 mg  25 mg Oral TID Armandina StammerNwoko, Agnes I, NP   25 mg at 08/10/17 0753   Followed by  . [START ON 08/11/2017] chlordiazePOXIDE (LIBRIUM) capsule 25 mg  25 mg Oral BH-qamhs Armandina StammerNwoko, Agnes I, NP       Followed by  . [START ON 08/12/2017] chlordiazePOXIDE (LIBRIUM) capsule 25 mg  25  mg Oral Daily Nwoko, Agnes I, NP      . divalproex (DEPAKOTE) DR tablet 500 mg  500 mg Oral Q12H Izediuno, Delight OvensVincent A, MD   500 mg at 08/10/17 1008  . [START ON 08/11/2017] FLUoxetine (PROZAC) capsule 20 mg  20 mg Oral Daily Elba Dendinger, Feliz Beamravis B, FNP      . gabapentin (NEURONTIN) capsule 800 mg  800 mg Oral QID PRN Armandina StammerNwoko, Agnes I, NP   800 mg at 08/10/17 0754  . hydrOXYzine (ATARAX/VISTARIL) tablet 25 mg  25 mg Oral Q6H PRN Armandina StammerNwoko, Agnes I, NP   25 mg at 08/09/17 2121  . ibuprofen (ADVIL,MOTRIN) tablet 800 mg  800 mg Oral Q8H PRN Greer Wainright, Gerlene Burdockravis B, FNP      . loperamide (IMODIUM) capsule 2-4 mg  2-4 mg Oral PRN Nwoko, Agnes I, NP      . magnesium hydroxide (MILK OF MAGNESIA) suspension 30 mL  30 mL Oral Daily PRN Rankin, Shuvon B, NP   30 mL at 08/10/17 0758  . mirtazapine (REMERON) tablet 15 mg  15 mg Oral QHS,MR X 1 Kerry HoughSimon, Spencer E, PA-C   15 mg at 08/09/17 2236  . multivitamin with minerals tablet 1 tablet  1 tablet Oral Daily Armandina StammerNwoko, Agnes I, NP   1 tablet at 08/10/17 0750  . nicotine (NICODERM CQ - dosed in mg/24 hours) patch 21 mg  21 mg Transdermal Daily Armandina StammerNwoko, Agnes I, NP   21 mg at 08/10/17 0751  . ondansetron (ZOFRAN-ODT) disintegrating tablet 4 mg  4 mg Oral Q6H PRN Nwoko, Agnes I, NP      . polyethylene glycol (MIRALAX / GLYCOLAX) packet 17 g  17 g Oral Daily Sacha Radloff B, FNP   17 g at 08/10/17 1055  . risperiDONE (RISPERDAL) tablet 1 mg  1 mg Oral QHS Izediuno, Delight OvensVincent A, MD   1 mg at 08/09/17 2127  . thiamine (VITAMIN B-1) tablet 100 mg  100 mg Oral Daily Armandina StammerNwoko, Agnes I, NP   100 mg at 08/10/17 [id number]    Lab Results:  Results for orders placed or performed during the hospital encounter of 08/08/17 (from the past 48 hour(s))  Valproic acid level     Status: None   Collection Time: 08/08/17  6:22 PM  Result Value Ref Range   Valproic Acid Lvl 98 50.0 - 100.0 ug/mL    Comment: Performed at [facility], [address] [place], [place] [address]    Blood Alcohol level:   Lab Results  Component Value Date   ETH 275 (  H) 08/06/2017   ETH <11 07/28/2012    Metabolic Disorder Labs: No results found for: HGBA1C, MPG No results found for: PROLACTIN No results found for: CHOL, TRIG, HDL, CHOLHDL, VLDL, LDLCALC  Physical Findings: AIMS: Facial and Oral Movements Muscles of Facial Expression: None, normal Lips and Perioral Area: None, normal Jaw: None, normal Tongue: None, normal,Extremity Movements Upper (arms, wrists, hands, fingers): None, normal Lower (legs, knees, ankles, toes): None, normal, Trunk Movements Neck, shoulders, hips: None, normal, Overall Severity Severity of abnormal movements (highest score from questions above): None, normal Incapacitation due to abnormal movements: None, normal Patient's awareness of abnormal movements (rate only patient's report): No Awareness, Dental Status Current problems with teeth and/or dentures?: No Does patient usually wear dentures?: No  CIWA:  CIWA-Ar Total: 3 COWS:     Musculoskeletal: Strength & Muscle Tone: within normal limits Gait & Station: normal Patient leans: N/A  Psychiatric Specialty Exam: Physical Exam  Nursing note and vitals reviewed. Constitutional: She is oriented to person, place, and time. She appears well-developed and well-nourished.  Respiratory: Effort normal.  Musculoskeletal: Normal range of motion.  Neurological: She is alert and oriented to person, place, and time.  Skin: Skin is warm.    Review of Systems  Constitutional: Negative.   HENT: Negative.   Eyes: Negative.   Respiratory: Negative.   Cardiovascular: Negative.   Gastrointestinal: Negative.   Genitourinary: Negative.   Musculoskeletal: Negative.   Skin: Negative.   Neurological: Negative.   Endo/Heme/Allergies: Negative.   Psychiatric/Behavioral: Positive for depression and substance abuse. Negative for hallucinations and suicidal ideas. The patient is nervous/anxious.     Blood pressure 111/71, pulse  (!) 118, temperature 98.7 F (37.1 C), temperature source Oral, resp. rate 16, height 5\' 4"  (1.626 m), weight 65.3 kg (144 lb), last menstrual period 08/06/2017, SpO2 100 %.Body mass index is 24.72 kg/m.  General Appearance: Casual  Eye Contact:  Good  Speech:  Clear and Coherent and Normal Rate  Volume:  Normal  Mood:  Anxious and Depressed  Affect:  Flat  Thought Process:  Goal Directed and Descriptions of Associations: Intact  Orientation:  Full (Time, Place, and Person)  Thought Content:  WDL  Suicidal Thoughts:  No  Homicidal Thoughts:  No  Memory:  Immediate;   Good Recent;   Good Remote;   Good  Judgement:  Good  Insight:  Good  Psychomotor Activity:  Normal  Concentration:  Concentration: Good and Attention Span: Good  Recall:  Good  Fund of Knowledge:  Good  Language:  Good  Akathisia:  No  Handed:  Right  AIMS (if indicated):     Assets:  Communication Skills Desire for Improvement Financial Resources/Insurance Social Support  ADL's:  Intact  Cognition:  WNL  Sleep:  Number of Hours: 4.5   Problems Addressed: MDD severe SUD Constipation  Treatment Plan Summary: Daily contact with patient to assess and evaluate symptoms and progress in treatment, Medication management and Plan is to:  -Start Depakote 500 mg PO BID for seizures -Start Ibuprofen 800 mg PO Q8H PRN for pain -Start Miralax 17 gm Daily for constipation -Start Dulcolax 5 mg PO Daily PRN for constipation -Increase Prozac 20 mg PO Daily for mood stability -Continue Remeron 15 mg PO QHS for mood stability and sleep -Continue Gabapentin 800 mg PO QI PRN for agitation and pain -Continue Librium Detox Protocol -Encourage group therapy participation  Maryfrances Bunnell, FNP 08/10/2017, 11:50 AM

## 2017-08-10 NOTE — Plan of Care (Signed)
Nurse discussed depression, anxiety, coping skills with patient.  

## 2017-08-10 NOTE — Progress Notes (Signed)
Recreation Therapy Notes  Date: 08/10/17 Time: 0930 Location: 300 Hall Dayroom  Group Topic: Stress Management  Goal Area(s) Addresses:  Patient will verbalize importance of using healthy stress management.  Patient will identify positive emotions associated with healthy stress management.   Behavioral Response: Engaged  Intervention: Stress Management  Activity :  Body Scan Meditation.  LRT introduced the stress management technique of meditation.  LRT played a meditation from the Calm app that allowed patients to take inventory of any sensations or tension they may have been experiencing.  Education:  Stress Management, Discharge Planning.   Education Outcome: Acknowledges edcuation/In group clarification offered/Needs additional education  Clinical Observations/Feedback: Pt attended group.    Osaze Hubbert, LRT/CTRS         Richardo Popoff A 08/10/2017 12:45 PM 

## 2017-08-11 DIAGNOSIS — R450 Nervousness: Secondary | ICD-10-CM

## 2017-08-11 DIAGNOSIS — F419 Anxiety disorder, unspecified: Secondary | ICD-10-CM

## 2017-08-11 DIAGNOSIS — K5900 Constipation, unspecified: Secondary | ICD-10-CM

## 2017-08-11 MED ORDER — MAGNESIUM CITRATE PO SOLN
1.0000 | Freq: Once | ORAL | Status: AC
  Administered 2017-08-11: 1 via ORAL

## 2017-08-11 NOTE — [facility] Group Notes (Signed)
LCSW Group Therapy Note  08/11/2017 9:30-10:30AM - 300 Hall, 10:30-11:30 - 400 Hall, 11:30-12:00 - 500 Hall  Type of Therapy and Topic:  Group Therapy: Anger Cues and Responses  Participation Level:  Did Not Attend   Description of Group:   In this group, patients learned how to recognize the physical, cognitive, emotional, and behavioral responses they have to anger-provoking situations.  They identified a recent time they became angry and how they reacted.  They analyzed how their reaction was possibly beneficial and how it was possibly unhelpful.  The group discussed a variety of healthier coping skills that could help with such a situation in the future.  Deep breathing was practiced briefly.  Therapeutic Goals: 1. Patients will remember their last incident of anger and how they felt emotionally and physically, what their thoughts were at the time, and how they behaved. 2. Patients will identify how their behavior at that time worked for them, as well as how it worked against them. 3. Patients will explore possible new behaviors to use in future anger situations. 4. Patients will learn that anger itself is normal and cannot be eliminated, and that healthier reactions can assist with resolving conflict rather than worsening situations.  Summary of Patient Progress:  N/A  Therapeutic Modalities:   Cognitive Behavioral Therapy  Lynnell ChadMareida J Grossman-Orr  08/11/2017 8:28 AM
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
## 2017-08-11 NOTE — Progress Notes (Signed)
Patient denies SI, HI and AVH.  Patient attended groups, engaged in unit activities and had no incidents of behavioral dsycontrol.   Assess patient for safety, offer medications as prescribed, engage patient in 1:1 therapeutic discussions.   Patient able to contract for safety. Continue to monitor as planned.  

## 2017-08-11 NOTE — Progress Notes (Addendum)
D.  Pt initially pleasant on approach without complaint.  Pt did attend evening AA group, observed engaged in appropriate interaction on unit.  Later in the shift Pt came up to nurse's station and demanded to go home.  This staff member explained the procedure to Pt and explained that an MD had to make that determination and sign off on a suicide risk assessment for her to be able to be discharged.  Pt stated "well wake him up".  Went and asked AC to speak with patient, which she did immediately.  Pt continued to demand to go home.  This staff member asked if Pt would like to sign a 72 hour request for discharge and Pt agreed to do so but stated that she "will go home tomorrow, you can count on it".  Pt stated that she had issues at home that required her immediate attention.  Pt did agree to go to bed after signing the request for discharge and will speak to the doctor tomorrow.  Pt denies SI/HI/AVH at this time.  A.  Support and encouragement offered, medication given as ordered  R.  Pt remains safe on the unit, will continue to monitor.    08/12/17  See new note with update.

## 2017-08-11 NOTE — [facility] Group Notes (Signed)
Orientation / Goals   Date:  08/11/2017  Time:  5:14 PM  Type of Therapy:  Nurse Education  /  The group focuses on teaching patients who their staff is, what the staff responsibilities are and what the daily programming and unit scheduling looks like.   Participation Level:  Patient  Did not attend.   Participation Quality:    Affect:    Cognitive:    Insight:    Engagement in Group:    Modes of Intervention:    Summary of Progress/Problems:  [name], [name] Lynn 08/11/2017, 5:14 PM

## 2017-08-12 MED ORDER — SIMETHICONE 80 MG PO CHEW
CHEWABLE_TABLET | ORAL | Status: AC
  Filled 2017-08-12: qty 1

## 2017-08-12 MED ORDER — ARIPIPRAZOLE 2 MG PO TABS
2.0000 mg | ORAL_TABLET | Freq: Two times a day (BID) | ORAL | Status: DC
  Administered 2017-08-12 – 2017-08-13 (×2): 2 mg via ORAL
  Filled 2017-08-12 (×5): qty 1

## 2017-08-12 MED ORDER — SIMETHICONE 80 MG PO CHEW
80.0000 mg | CHEWABLE_TABLET | Freq: Two times a day (BID) | ORAL | Status: DC
  Administered 2017-08-12 – 2017-08-13 (×3): 80 mg via ORAL
  Filled 2017-08-12 (×5): qty 1

## 2017-08-12 MED ORDER — PROPRANOLOL HCL 10 MG PO TABS
10.0000 mg | ORAL_TABLET | Freq: Two times a day (BID) | ORAL | Status: DC
  Administered 2017-08-12 – 2017-08-13 (×3): 10 mg via ORAL
  Filled 2017-08-12 (×5): qty 1

## 2017-08-12 MED ORDER — PROPRANOLOL HCL 10 MG PO TABS
ORAL_TABLET | ORAL | Status: AC
  Filled 2017-08-12: qty 1

## 2017-08-12 MED ORDER — ARIPIPRAZOLE 2 MG PO TABS
ORAL_TABLET | ORAL | Status: AC
  Administered 2017-08-12: 2 mg
  Filled 2017-08-12: qty 1
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
## 2017-08-12 NOTE — [facility] Group Notes (Signed)
[facility] Group Notes:  (Nursing/MHT/Case Management/Adjunct)  Date:  08/12/2017  Time:  8:53 AM  Type of Therapy:  Goals/Orientation Group.  Participation Level:  Active  Participation Quality:  Appropriate  Affect:  Appropriate  Cognitive:  Appropriate  Insight:  Appropriate  Engagement in Group:  Engaged  Modes of Intervention:  Discussion  Summary of Progress/Problems: Pt attended goals/orientation group, pt was receptive.   Amaury Kuzel Shanta 08/12/2017, 8:53 AM 
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
## 2017-08-12 NOTE — [facility] Group Notes (Signed)
[facility] Group Notes:  (Nursing/MHT/Case Management/Adjunct)  Date:  08/12/2017  Time:  1:47 PM  Type of Therapy:  Psychoeducational Skills  Participation Level:  Active  Participation Quality:  Appropriate  Affect:  Appropriate  Cognitive:  Appropriate  Insight:  Appropriate  Engagement in Group:  Engaged  Modes of Intervention:  Problem-solving  Summary of Progress/Problems: Pt attended Psychoeducational group with top topic healthy support systems.   Jacquelyne BalintForrest, Olia Hinderliter Shanta 08/12/2017, 1:47 PM

## 2017-08-12 NOTE — Progress Notes (Signed)
Patient ID: Norma DeistMarie J [name], female   DOB: 1971-01-24, 47 y.o.   MRN: [id number]   Pt signed a 72 hour request for discharge on 08/12/17 @245pm . Jacquelyne BalintShalita Javone Ybanez RN

## 2017-08-12 NOTE — Progress Notes (Signed)
D.  Pt pleasant on approach, complaint of gas and constipation.  Pt's husband visited this evening and Pt feels it went well.  Pt was positive for evening AA group but left due to gas issue.  Pt stated she could still hear speaker in her room as her room is next tot he group room.  Pt denies SI/HI/AVH at this time.  A.  Support and encouragement offered, medication given as ordered  R.  Pt remains safe on the unit, will continue to monitor.
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
## 2017-08-12 NOTE — Progress Notes (Signed)
Patient did attend the evening speaker AA meeting.  
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
## 2017-08-13 ENCOUNTER — Emergency Department (HOSPITAL_COMMUNITY)
Admission: EM | Admit: 2017-08-13 | Discharge: 2017-08-16 | Disposition: A | Discharge status: Home / Self Care | Payer: Federal, State, Local not specified - Other | Attending: Emergency Medicine | Admitting: Emergency Medicine

## 2017-08-13 ENCOUNTER — Other Ambulatory Visit: Payer: Self-pay

## 2017-08-13 ENCOUNTER — Encounter (HOSPITAL_COMMUNITY): Payer: Self-pay | Admitting: Emergency Medicine

## 2017-08-13 DIAGNOSIS — F329 Major depressive disorder, single episode, unspecified: Secondary | ICD-10-CM

## 2017-08-13 DIAGNOSIS — Z79899 Other long term (current) drug therapy: Secondary | ICD-10-CM | POA: Insufficient documentation

## 2017-08-13 DIAGNOSIS — F32A Depression, unspecified: Secondary | ICD-10-CM

## 2017-08-13 DIAGNOSIS — R45851 Suicidal ideations: Secondary | ICD-10-CM | POA: Insufficient documentation

## 2017-08-13 DIAGNOSIS — F1994 Other psychoactive substance use, unspecified with psychoactive substance-induced mood disorder: Secondary | ICD-10-CM | POA: Insufficient documentation

## 2017-08-13 DIAGNOSIS — F332 Major depressive disorder, recurrent severe without psychotic features: Secondary | ICD-10-CM | POA: Insufficient documentation

## 2017-08-13 DIAGNOSIS — F17210 Nicotine dependence, cigarettes, uncomplicated: Secondary | ICD-10-CM | POA: Insufficient documentation

## 2017-08-13 LAB — RAPID URINE DRUG SCREEN, HOSP PERFORMED
AMPHETAMINES: NOT DETECTED
BARBITURATES: NOT DETECTED
Benzodiazepines: POSITIVE — AB
Cocaine: NOT DETECTED
Opiates: NOT DETECTED
Tetrahydrocannabinol: NOT DETECTED

## 2017-08-13 LAB — COMPREHENSIVE METABOLIC PANEL
ALBUMIN: 3.3 g/dL — AB (ref 3.5–5.0)
ALK PHOS: 52 U/L (ref 38–126)
ALT: 17 U/L (ref 14–54)
AST: 23 U/L (ref 15–41)
Anion gap: 13 (ref 5–15)
BUN: 16 mg/dL (ref 6–20)
CALCIUM: 9 mg/dL (ref 8.9–10.3)
CO2: 26 mmol/L (ref 22–32)
CREATININE: 0.81 mg/dL (ref 0.44–1.00)
Chloride: 102 mmol/L (ref 101–111)
GFR calc non Af Amer: >60 mL/min (ref >60)
GLUCOSE: 144 mg/dL — AB (ref 65–99)
Potassium: 4.1 mmol/L (ref 3.5–5.1)
SODIUM: 141 mmol/L (ref 135–145)
Total Bilirubin: 0.4 mg/dL (ref 0.3–1.2)
Total Protein: 6.6 g/dL (ref 6.5–8.1)

## 2017-08-13 LAB — PREGNANCY, URINE: Preg Test, Ur: NEGATIVE

## 2017-08-13 LAB — CBC
HEMATOCRIT: 38.8 % (ref 36.0–46.0)
Hemoglobin: 12.9 g/dL (ref 12.0–15.0)
MCH: 30.8 pg (ref 26.0–34.0)
MCHC: 33.2 g/dL (ref 30.0–36.0)
MCV: 92.6 fL (ref 78.0–100.0)
Platelets: 240 10*3/uL (ref 150–400)
RBC: 4.19 MIL/uL (ref 3.87–5.11)
RDW: 13.9 % (ref 11.5–15.5)
WBC: 4.1 10*3/uL (ref 4.0–10.5)

## 2017-08-13 LAB — SALICYLATE LEVEL: Salicylate Lvl: <7 mg/dL (ref 2.8–30.0)

## 2017-08-13 LAB — ETHANOL: Alcohol, Ethyl (B): 57 mg/dL — ABNORMAL HIGH (ref <10)

## 2017-08-13 LAB — ACETAMINOPHEN LEVEL: Acetaminophen (Tylenol), Serum: <10 ug/mL — ABNORMAL LOW (ref 10–30)

## 2017-08-13 MED ORDER — ACETAMINOPHEN 325 MG PO TABS
650.0000 mg | ORAL_TABLET | ORAL | Status: DC | PRN
  Filled 2017-08-13: qty 2

## 2017-08-13 MED ORDER — PROPRANOLOL HCL 10 MG PO TABS
10.0000 mg | ORAL_TABLET | Freq: Two times a day (BID) | ORAL | Status: DC
  Administered 2017-08-13 – 2017-08-16 (×5): 10 mg via ORAL
  Filled 2017-08-13 (×6): qty 1

## 2017-08-13 MED ORDER — FLUOXETINE HCL 20 MG PO CAPS: 20.0000 mg | ORAL_CAPSULE | Freq: Every day | ORAL | 0 refills | Status: AC

## 2017-08-13 MED ORDER — MIRTAZAPINE 15 MG PO TABS: 15.0000 mg | ORAL_TABLET | Freq: Every day | ORAL | 0 refills | Status: AC

## 2017-08-13 MED ORDER — ONDANSETRON HCL 4 MG PO TABS
4.0000 mg | ORAL_TABLET | Freq: Three times a day (TID) | ORAL | Status: DC | PRN
  Administered 2017-08-13 – 2017-08-14 (×2): 4 mg via ORAL
  Filled 2017-08-13 (×2): qty 1

## 2017-08-13 MED ORDER — ARIPIPRAZOLE 2 MG PO TABS
ORAL_TABLET | ORAL | Status: AC
  Filled 2017-08-13: qty 1

## 2017-08-13 MED ORDER — ACETAMINOPHEN 325 MG PO TABS
650.0000 mg | ORAL_TABLET | Freq: Once | ORAL | Status: AC
  Administered 2017-08-13: 650 mg via ORAL
  Filled 2017-08-13: qty 2

## 2017-08-13 MED ORDER — NICOTINE 21 MG/24HR TD PT24
21.0000 mg | MEDICATED_PATCH | Freq: Every day | TRANSDERMAL | Status: DC | PRN
  Administered 2017-08-13: 21 mg via TRANSDERMAL
  Filled 2017-08-13 (×2): qty 1

## 2017-08-13 MED ORDER — GABAPENTIN 400 MG PO CAPS: 800.0000 mg | ORAL_CAPSULE | Freq: Four times a day (QID) | ORAL | 0 refills | Status: AC | PRN

## 2017-08-13 MED ORDER — PROPRANOLOL HCL 10 MG PO TABS: 10.0000 mg | ORAL_TABLET | Freq: Two times a day (BID) | ORAL | 0 refills | Status: AC

## 2017-08-13 MED ORDER — MIRTAZAPINE 15 MG PO TABS
15.0000 mg | ORAL_TABLET | Freq: Every day | ORAL | Status: DC
  Administered 2017-08-13 – 2017-08-15 (×3): 15 mg via ORAL
  Filled 2017-08-13 (×5): qty 1

## 2017-08-13 MED ORDER — DIVALPROEX SODIUM 500 MG PO DR TAB: 500.0000 mg | DELAYED_RELEASE_TABLET | Freq: Two times a day (BID) | ORAL | 0 refills | Status: AC

## 2017-08-13 MED ORDER — DIVALPROEX SODIUM 250 MG PO DR TAB
500.0000 mg | DELAYED_RELEASE_TABLET | Freq: Two times a day (BID) | ORAL | Status: DC
  Administered 2017-08-13 – 2017-08-16 (×6): 500 mg via ORAL
  Filled 2017-08-13 (×6): qty 2

## 2017-08-13 MED ORDER — GABAPENTIN 400 MG PO CAPS
800.0000 mg | ORAL_CAPSULE | Freq: Four times a day (QID) | ORAL | Status: DC | PRN
  Administered 2017-08-13 – 2017-08-16 (×10): 800 mg via ORAL
  Filled 2017-08-13 (×10): qty 2

## 2017-08-13 MED ORDER — FLUOXETINE HCL 20 MG PO CAPS
20.0000 mg | ORAL_CAPSULE | Freq: Every day | ORAL | Status: DC
  Administered 2017-08-14 – 2017-08-16 (×3): 20 mg via ORAL
  Filled 2017-08-13 (×3): qty 1

## 2017-08-13 MED ORDER — ARIPIPRAZOLE 2 MG PO TABS: 2.0000 mg | ORAL_TABLET | Freq: Two times a day (BID) | ORAL | 0 refills | Status: AC

## 2017-08-13 MED ORDER — ARIPIPRAZOLE 2 MG PO TABS
2.0000 mg | ORAL_TABLET | Freq: Two times a day (BID) | ORAL | Status: DC
  Administered 2017-08-13 – 2017-08-16 (×6): 2 mg via ORAL
  Filled 2017-08-13 (×8): qty 1

## 2017-08-13 NOTE — ED Notes (Signed)
telepsych in progress 
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
## 2017-08-13 NOTE — Progress Notes (Addendum)
  [facility] Discharge Plan :  Will you be returning to the same living situation after discharge:  No. [place] go to [place] to live with her mother.  At discharge, do you have transportation home?: Yes,  patient reports her friend will pick her up at discharge. Do you have the ability to pay for your medications: No.  Release of information consent forms completed and in the chart;  Patient's signature needed at discharge.  Patient to Follow up at: Follow-up Information    [facility] Follow up.   Why:  Follow up is scheduled for 08/16/17 at 8am.  Please bring an ID, [employer]Social Security Card, proof of household income, and proof of insurance if any is available.  Contact information: [address] [place] [id number] [phone]           Next level of care provider has access to [facility] Link:yes  Safety Planning and Suicide Prevention discussed: Yes,  with the patient  Have you used any form of tobacco in the last 30 days? (Cigarettes, Smokeless Tobacco, Cigars, and/or Pipes): Yes  Has patient been referred to the Quitline?: Patient refused referral  Patient has been referred for addiction treatment: Pt. refused referral  Maeola SarahJolan E Dewey Neukam, LCSWA 08/13/2017, 9:11 AM

## 2017-08-13 NOTE — ED Notes (Signed)
Patient changed into scrubs, wanded by security. Lab in to draw blood. Sitter present.
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
## 2017-08-13 NOTE — ED Notes (Signed)
Norma Sextonalled [name] with house coverage for abilify and remeron

## 2017-08-13 NOTE — [facility] Suicide Risk Assessment (Signed)
[facility] INPATIENT:  Family/Significant Other Suicide Prevention Education  Suicide Prevention Education:  Education Completed; [name] (mother, [phone]) has been identified by the patient as the family member/significant other with whom the patient will be residing, and identified as the person(s) who will aid the patient in the event of a mental health crisis (suicidal ideations/suicide attempt).  With written consent from the patient, the family member/significant other has been provided the following suicide prevention education, prior to the and/or following the discharge of the patient.  The suicide prevention education provided includes the following:  Suicide risk factors  Suicide prevention and interventions  National Suicide Hotline telephone number  [facility] assessment telephone number  [facility] Emergency Assistance 911  [facility] and/or Residential Mobile Crisis Unit telephone number  Request made of family/significant other to:  Remove weapons (e.g., guns, rifles, knives), all items previously/currently identified as safety concern.    Remove drugs/medications (over-the-counter, prescriptions, illicit drugs), all items previously/currently identified as a safety concern.  The family member/significant other verbalizes understanding of the suicide prevention education information provided.  The family member/significant other agrees to remove the items of safety concern listed above.  Maeola SarahJolan E Illyana Schorsch 08/13/2017, 10:36 AM

## 2017-08-13 NOTE — Progress Notes (Signed)
The patient reports that she plans to travel to [place], to live with her mother at discharge. [name] gave this CSW verbal consent to contact her mother, [name] for collateral information, prior to her being discharged.   CSW contacted the patient's mother, [name] ([phone]) to verify the patient's discharge plans.  Per [name], the patient is coming to [place] via bus to live with her. [name] also reports that she and the patient will explore outpatient services in [place] so that the patient can receive medication management and therapy services once she arrives to [place].   CSW will share this information with Dr. [name] and will continue to follow for a successful discharge from the hospital.    [name], MSW, LCSWA Clinical Social Worker [facility]  Phone: [phone]

## 2017-08-13 NOTE — Progress Notes (Signed)
Pt discharged home with her female friend. Pt was ambulatory, stable and appreciative at that time. All papers and prescriptions, medication sample were given and valuables returned. Verbal understanding expressed. Denies SI/HI and A/VH. Pt given opportunity to express concerns and ask questions.

## 2017-08-13 NOTE — [facility] Suicide Risk Assessment (Signed)
[facility]   Principal Problem: Substance induced mood disorder [facility](HCC) Discharge Diagnoses:  Patient Active Problem List   Diagnosis Date Noted  . Constipation [K59.00] 08/10/2017  . Substance induced mood disorder (HCC) [F19.94] 08/09/2017  . MDD (major depressive disorder), recurrent severe, without psychosis (HCC) [F33.2] 08/08/2017  . Depression [F32.9] 07/31/2012  . Anxiety [F41.9] 07/31/2012    Total Time spent with patient: 45 minutes  Musculoskeletal: Strength & Muscle Tone: within normal limits Gait & Station: normal Patient leans: N/A  Psychiatric Specialty Exam: Review of Systems  Constitutional: Negative.   HENT: Negative.   Eyes: Negative.   Respiratory: Negative.   Cardiovascular: Negative.   Gastrointestinal: Negative.   Genitourinary: Negative.   Musculoskeletal: Negative.   Skin: Negative.   Neurological: Negative.   Endo/Heme/Allergies: Negative.   Psychiatric/Behavioral: Negative for depression, hallucinations, memory loss, substance abuse and suicidal ideas. The patient is not nervous/anxious and does not have insomnia.     Blood pressure 104/89, pulse 78, temperature 98.1 F (36.7 C), temperature source Oral, resp. rate 18, height 5\' 4"  (1.626 m), weight 65.3 kg (144 lb), last menstrual period 08/06/2017, SpO2 100 %.Body mass index is 24.72 kg/m.  General Appearance: Neatly dressed, pleasant, engaging well and cooperative. Appropriate behavior. Not in any distress. Good relatedness. Not internally stimulated.  Eye Contact::  Good  Speech:  Spontaneous, normal prosody. Normal tone and rate.   Volume:  Normal  Mood:  Euthymic  Affect:  Appropriate and Full Range  Thought Process:  Linear  Orientation:  Full (Time, Place, and Person)  Thought Content:  No delusional theme. No preoccupation with violent thoughts. No negative ruminations. No obsession.  No hallucination in any modality.   Suicidal Thoughts:  No  Homicidal  Thoughts:  No  Memory:  Immediate;   Good Recent;   Good Remote;   Good  Judgement:  Good  Insight:  Good  Psychomotor Activity:  Normal  Concentration:  Good  Recall:  Good  Fund of Knowledge:Good  Language: Good  Akathisia:  Negative  Handed:    AIMS (if indicated):     Assets:  Communication Skills Desire for Improvement Intimacy Physical Health Resilience  Sleep:  Number of Hours: 4.75  Cognition: WNL  ADL's:  Intact   Clinical Assessment::   47 y.o Caucasian female, married, homeless. Background history of SUD and mood disorder. Presented to the ER intoxicated with alcohol. BAL 273 mg/dl. She reported thoughts of slitting her wrist. She is overwhelmed with being homeless. She has been drinking on a daily basis. She has been using benzodiazepines and pain medications from the streets. Routine labs are WNL.Toxicology is negative. UDS is is negative.  Seen today. Reports that she is in good spirits. Her husband visited last night. Says it went well. They do not have any animosity. Says she is no longer angry with him. She has clarified that he was not having an affair. Patient has completely come off substances. No lingering withdrawal symptoms. She is not feeling depressed. Reports normal energy and interest. She is able to think clearly. She is able to focus on task. Her thoughts are not crowded or racing. No evidence of mania. No hallucination in any modality. She is not making any delusional statement. No passivity of will/thought. She is fully in touch with reality. No thoughts of suicide. No thoughts of homicide. No violent thoughts. No overwhelming anxiety. No access to weapons. Denies any new stressors at home.   Nursing staff reports that  patient has been appropriate on the unit. Patient has been interacting well with peers. No behavioral issues. Patient has not voiced any suicidal thoughts. Patient has not been observed to be internally stimulated. Patient has been adherent  with treatment recommendations. Patient has been tolerating their medication well.   Patient was discussed at team. Team members feels that patient is back to her baseline level of function. Team agrees with plan to discharge patient today.    Demographic Factors:  Low socioeconomic status and Unemployed  Loss Factors: Financial problems/change in socioeconomic status  Historical Factors: Family history of mental illness or substance abuse and Impulsivity  Risk Reduction Factors:   Sense of responsibility to family, Living with another person, especially a relative, Positive social support, Positive therapeutic relationship and Positive coping skills or problem solving skills  Continued Clinical Symptoms:  As above   Cognitive Features That Contribute To Risk:  None    Suicide Risk:  Minimal: No identifiable suicidal ideation.  Patient is not having any thoughts of suicide at this time. Modifiable risk factors targeted during this admission includes depression and substance use. Demographical and historical risk factors cannot be modified. Patient is now engaging well. Patient is reliable and is future oriented. We have buffered patient's support structures. At this point, patient is at low risk of suicide. Patient is aware of the effects of psychoactive substances on decision making process. Patient has been provided with emergency contacts. Patient acknowledges to use resources provided if unforseen circumstances changes their current risk stratification.    Follow-up Information    [facility] Follow up.   Why:  Follow up is scheduled for 08/16/17 at 8am.  Please bring an ID, [employer], proof of household income, and proof of insurance if any is available.  Contact information: [address] [place] [address] [phone]           Plan Of Care/Follow-up recommendations:  1. Continue current psychotropic medications 2. Mental health and addiction  follow up as arranged.  3. Discharge in care of her family 4. Provided limited quantity of prescriptions   [name], MD 08/13/2017, 9:50 AM

## 2017-08-13 NOTE — ED Notes (Signed)
Spoke with [name], recommend pt to stay overnight and reassess in the morning.  EDP's extension given to call EDP for recommendation.
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
## 2017-08-13 NOTE — ED Notes (Signed)
Pt called her mother who lives in [place] and told her she was going to kill herself by overdosing on pills; pt's mother called this RN and stated pt would kill herself if given the chance; this RN informed mother that a note would be made in the chart about the patient's comment and behavior

## 2017-08-14 DIAGNOSIS — G4700 Insomnia, unspecified: Secondary | ICD-10-CM

## 2017-08-14 DIAGNOSIS — Z91410 Personal history of adult physical and sexual abuse: Secondary | ICD-10-CM | POA: Diagnosis not present

## 2017-08-14 DIAGNOSIS — F332 Major depressive disorder, recurrent severe without psychotic features: Secondary | ICD-10-CM

## 2017-08-14 DIAGNOSIS — F17210 Nicotine dependence, cigarettes, uncomplicated: Secondary | ICD-10-CM

## 2017-08-14 DIAGNOSIS — R45851 Suicidal ideations: Secondary | ICD-10-CM

## 2017-08-14 DIAGNOSIS — R450 Nervousness: Secondary | ICD-10-CM

## 2017-08-14 DIAGNOSIS — F411 Generalized anxiety disorder: Secondary | ICD-10-CM | POA: Diagnosis not present

## 2017-08-14 DIAGNOSIS — F39 Unspecified mood [affective] disorder: Secondary | ICD-10-CM

## 2017-08-14 MED ORDER — IBUPROFEN 800 MG PO TABS
800.0000 mg | ORAL_TABLET | Freq: Four times a day (QID) | ORAL | Status: DC | PRN
  Administered 2017-08-14 – 2017-08-16 (×3): 800 mg via ORAL
  Filled 2017-08-14 (×3): qty 1

## 2017-08-14 NOTE — Progress Notes (Signed)
Pt. meets criteria for inpatient treatment per Ferne ReusJustina Okonkwo NP.  Referred out to the following hospitals:  [facility]     [facility]  [facility]  [facility]  [facility]  [facility]  [facility]  [facility]  [facility]        Disposition CSW will continue to follow for placement

## 2017-08-14 NOTE — ED Notes (Signed)
Pt c/o nausea.  
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
## 2017-08-15 MED ORDER — ALBUTEROL SULFATE (2.5 MG/3ML) 0.083% IN NEBU: 2.5000 mg | INHALATION_SOLUTION | RESPIRATORY_TRACT | Status: DC | PRN

## 2017-08-15 MED ORDER — ALBUTEROL SULFATE HFA 108 (90 BASE) MCG/ACT IN AERS
1.0000 | INHALATION_SPRAY | RESPIRATORY_TRACT | Status: DC | PRN
  Administered 2017-08-15: 2 via RESPIRATORY_TRACT
  Filled 2017-08-15: qty 6.7

## 2017-08-15 MED ORDER — LORAZEPAM 2 MG/ML IJ SOLN: 0.0000 mg | Freq: Four times a day (QID) | INTRAMUSCULAR | Status: DC

## 2017-08-15 MED ORDER — VITAMIN B-1 100 MG PO TABS
100.0000 mg | ORAL_TABLET | Freq: Every day | ORAL | Status: DC
  Administered 2017-08-15 – 2017-08-16 (×2): 100 mg via ORAL
  Filled 2017-08-15 (×2): qty 1

## 2017-08-15 MED ORDER — LORAZEPAM 2 MG/ML IJ SOLN: 0.0000 mg | Freq: Two times a day (BID) | INTRAMUSCULAR | Status: DC

## 2017-08-15 MED ORDER — LORAZEPAM 1 MG PO TABS
0.0000 mg | ORAL_TABLET | Freq: Four times a day (QID) | ORAL | Status: DC
  Administered 2017-08-15 (×3): 2 mg via ORAL
  Administered 2017-08-16: 1 mg via ORAL
  Administered 2017-08-16: 2 mg via ORAL
  Filled 2017-08-15 (×4): qty 2
  Filled 2017-08-15: qty 1

## 2017-08-15 MED ORDER — LORAZEPAM 1 MG PO TABS: 0.0000 mg | ORAL_TABLET | Freq: Two times a day (BID) | ORAL | Status: DC

## 2017-08-15 MED ORDER — THIAMINE HCL 100 MG/ML IJ SOLN: 100.0000 mg | Freq: Every day | INTRAMUSCULAR | Status: DC

## 2017-08-15 NOTE — Progress Notes (Signed)
CSW had earlier spoken with Gerrianne ScaleKathy Acres, Counselor at [facility] and it was explained that pt referral could be sent but that the first inpatient bed they expected to have would be September 05, 2017. CSW spoke with pt to assess her desire to go to rehab treatment. Pt stated, I'm interested in mental health treatment first, but what I'm interested in is also getting a bus ticket to [place] to go be with my mother.  When asked, pt continued to endorse SI without a specific plan.  Disposition CSW will continue to seek inpatient treatment but will also explore the possibility of getting a bus ticket to [place].  CSW will contact CSW Leadership to discuss that possibility.  Timmothy EulerJean T. Kaylyn LimSutter, MSW, LCSWA Disposition Clinical Social Work [phone] (cell) [phone] (office)

## 2017-08-15 NOTE — BH Assessment (Signed)
Reassessment: Pt was cooperative but anxious during reassessment. She states that she is still feeling suicidal and needs help with her "mental health". Pt states that she has been depressed and her drinking has gotten out of control. She came in on the 18th and states that her withdrawals are starting to get worse. She is having cold sweats, hearing voices, seeing "shadows of dogs" and is having increased anxiety. Pt states that she needs to get help then she wants to go to be with her mother in [place]. Pt continues to meet inpatient criteria. TTS to find placement. EDP notified about pt's request for detox protocol.

## 2017-08-15 NOTE — ED Notes (Signed)
Ambulated pt around nurses station x10 per pt's requesty

## 2017-08-15 NOTE — Progress Notes (Signed)
In reviewing patient chart, she states that she is interested in going to rehab and has reached out to [facility].  Disposition CSW called [facility] in [place] [phone]) to inquire as to bed availability.  Left message with staff member, Shanda BumpsJessica, who will ask that an admission counselor call me back as soon, as possible.  Timmothy EulerJean T. Kaylyn LimSutter, MSW, LCSWA Disposition Clinical Social Work [phone] (cell) [phone] (office)

## 2017-08-15 NOTE — ED Notes (Signed)
Pt requests book from her personal belongings to read, book obtained, security wanded book for safety, book given to pt
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
## 2017-08-16 ENCOUNTER — Ambulatory Visit (HOSPITAL_COMMUNITY)
Admission: RE | Admit: 2017-08-16 | Discharge: 2017-08-16 | Disposition: A | Discharge status: Home / Self Care | Payer: Self-pay | Attending: Psychiatry | Admitting: Psychiatry

## 2017-08-16 ENCOUNTER — Encounter (HOSPITAL_COMMUNITY): Payer: Self-pay | Admitting: Registered Nurse
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
## 2017-08-17 NOTE — Clinical Social Work Note (Signed)
[facility] purchased train ticket for patient to go to [place], [place] to reside with her mother, [name]. Mother was advised of ETA of train arrival and agreed to pick patient up.  LCSW singing off.

## 2017-08-17 NOTE — BHH Counselor (Signed)
Pt came [facility] signed in for a TTS consult but declined assessment and wanted additional resources. [facility] left [facility] without a TTS consult being completed in efforts to see her mother in [place] and to get way from the stress in [place].   Per chart, pt was assessed at [facility] on 08/06/2017 for suicidal thoughts and [facility] 08/08/2017-08/13/2017, for inpatient treatment. Pt assisted in developing her discharge plan that included: living with her mother in [place], while in [place] continue with medication management and counseling. Per pt's chart, she returned to [facility] on 08/13/2017 for suicidal thoughts with plan to cut her wrist and was psychiatrically clear to discharged on 08/16/2017. Per chart, pt's discharge plan stayed the same.   Redmond Pullingreylese D Marikay Roads, MS, [facility], [facility] Triage Specialist [phone]

## 2017-08-21 ENCOUNTER — Inpatient Hospital Stay
Admit: 2017-08-21 | Discharge: 2017-08-21 | Disposition: A | Discharge status: Home / Self Care | Payer: Self-pay | Attending: Emergency Medicine

## 2017-08-21 DIAGNOSIS — R443 Hallucinations, unspecified: Secondary | ICD-10-CM

## 2017-08-21 LAB — CBC WITH AUTOMATED DIFF
ABS. BASOPHILS: 0 10*3/uL (ref 0.0–0.1)
ABS. EOSINOPHILS: 0.1 10*3/uL (ref 0.0–0.5)
ABS. IMM. GRANS.: 0 10*3/uL
ABS. LYMPHOCYTES: 1.7 10*3/uL (ref 0.8–3.5)
ABS. MONOCYTES: 0.7 10*3/uL — ABNORMAL LOW (ref 0.8–3.5)
ABS. NEUTROPHILS: 3.7 10*3/uL (ref 1.5–8.0)
BASOPHILS: 1 % (ref 0–2)
EOSINOPHILS: 1 % (ref 0–5)
HCT: 37.2 % — ABNORMAL LOW (ref 41–53)
HGB: 12.8 g/dL (ref 12.0–16.0)
IMMATURE GRANULOCYTES: 0 % — ABNORMAL LOW (ref 2–10)
LYMPHOCYTES: 28 % (ref 19–48)
MCH: 31.3 PG — ABNORMAL HIGH (ref 27–31)
MCHC: 34.4 g/dL (ref 31–37)
MCV: 91 FL (ref 80–100)
MONOCYTES: 11 % — ABNORMAL HIGH (ref 3–9)
MPV: 9 FL (ref 5.9–10.3)
NEUTROPHILS: 59 % (ref 40–74)
PLATELET: 291 10*3/uL (ref 130–400)
RBC: 4.09 M/uL — ABNORMAL LOW (ref 4.2–5.4)
RDW: 13.8 % (ref 11.5–14.5)
WBC: 6.3 10*3/uL (ref 4.5–10.8)

## 2017-08-21 LAB — DRUG SCREEN, URINE
AMPHETAMINES: NEGATIVE
BARBITURATES: NEGATIVE
BENZODIAZEPINES: POSITIVE
COCAINE: NEGATIVE
METHADONE: NEGATIVE
OPIATES: NEGATIVE
PCP(PHENCYCLIDINE): NEGATIVE
THC (TH-CANNABINOL): NEGATIVE
TRICYCLICS: NEGATIVE

## 2017-08-21 LAB — METABOLIC PANEL, BASIC
Anion gap: 8 mmol/L (ref 6–15)
BUN/Creatinine ratio: 19 (ref 7–25)
BUN: 15 MG/DL (ref 7–18)
CO2: 28 mmol/L (ref 21–32)
Calcium: 8.7 MG/DL (ref 8.5–10.1)
Chloride: 102 mmol/L (ref 98–107)
Creatinine: 0.81 MG/DL (ref 0.60–1.30)
GFR est AA: >60 mL/min/{1.73_m2} (ref >60)
GFR est non-AA: >60 mL/min/{1.73_m2} (ref >60)
Glucose: 97 mg/dL (ref 70–110)
Potassium: 3.9 mmol/L (ref 3.5–5.3)
Sodium: 138 mmol/L (ref 136–145)

## 2017-08-21 LAB — URINALYSIS W/ RFLX MICROSCOPIC
Bilirubin: NEGATIVE
Blood: NEGATIVE
Glucose: NEGATIVE mg/dL
Ketone: NEGATIVE mg/dL
Leukocyte Esterase: NEGATIVE
Nitrites: NEGATIVE
Protein: NEGATIVE mg/dL
Specific gravity: 1.015 (ref 1.002–1.030)
Urobilinogen: 0.2 EU/dL (ref 0–1)
pH (UA): 5.5 (ref 4.5–8.0)

## 2017-08-21 LAB — HEPATIC FUNCTION PANEL
A-G Ratio: 1 — ABNORMAL LOW (ref 1.2–2.2)
ALT (SGPT): 20 U/L (ref 12–78)
AST (SGOT): 22 U/L (ref 15–37)
Albumin: 3.5 g/dL (ref 3.4–5.0)
Alk. phosphatase: 61 U/L (ref 45–117)
Bilirubin, direct: 0.1 MG/DL (ref 0.0–0.2)
Bilirubin, total: 0.3 MG/DL (ref <1.1)
Globulin: 3.5 g/dL (ref 2.4–3.5)
Protein, total: 7 g/dL (ref 6.4–8.2)

## 2017-08-21 LAB — HCG QL SERUM: HCG, Ql.: NEGATIVE

## 2017-08-21 LAB — THYROID PANEL W/TSH
Free thyroxine index: 3.3 (ref 1.4–5.2)
T3 Uptake: 37 % (ref 31–39)
T4, Total: 9 ug/dL (ref 4.7–13.3)
TSH: 3.28 u[IU]/mL (ref 0.35–3.74)

## 2017-08-21 LAB — VALPROIC ACID: Valproic acid: 13 ug/ml — ABNORMAL LOW (ref 50–100)

## 2017-08-21 LAB — ETHYL ALCOHOL: ALCOHOL(ETHYL),SERUM: 4 MG/DL

## 2017-08-21 LAB — ACETAMINOPHEN: Acetaminophen level: <10 ug/mL — ABNORMAL LOW (ref 10–30)

## 2017-08-21 LAB — SALICYLATE: Salicylate level: 3.5 MG/DL (ref 2.8–20.0)

## 2017-08-21 MED ORDER — HYDROXYZINE PAMOATE 25 MG CAP
25 mg | ORAL_CAPSULE | Freq: Three times a day (TID) | ORAL | 0 refills | Status: AC | PRN
Start: 2017-08-21 — End: 2017-08-28
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
## 2017-08-21 NOTE — ED Notes (Signed)
Report to Bessie RN
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
## 2017-08-21 NOTE — ED Notes (Signed)
Pt report given to Amanda RN

## 2017-08-21 NOTE — ED Notes (Signed)
Talked to Vernona RiegerLaura in lab regarding additional orders.

## 2017-08-21 NOTE — ED Notes (Signed)
Pt sitting in rapid treatment area, awake alert.  Security at side.  Denies needs at this time

## 2017-08-21 NOTE — Other (Signed)
Patient denies suicidal ideation, homicidal ideation, and auditory and visual hallucinations at this time.  Patient states that she does not want to be admitted at this time and has followup appointment set [facility] on Friday at [facility] in [place], [place] where her mother lives.  Patient is not appropriate for inpatient admission at this time, ED notified
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
## 2017-08-21 NOTE — ED Notes (Signed)
Lab notified of additional orders.

## 2021-12-10 ENCOUNTER — Encounter (HOSPITAL_COMMUNITY): Payer: Self-pay | Admitting: Emergency Medicine

## 2021-12-10 ENCOUNTER — Emergency Department (HOSPITAL_COMMUNITY)
Admission: EM | Admit: 2021-12-10 | Discharge: 2021-12-10 | Discharge status: Against Medical Advice | Payer: Medicaid - Out of State | Attending: Emergency Medicine | Admitting: Emergency Medicine

## 2021-12-10 ENCOUNTER — Ambulatory Visit: Payer: Self-pay

## 2021-12-10 ENCOUNTER — Other Ambulatory Visit: Payer: Self-pay

## 2021-12-10 DIAGNOSIS — Z5321 Procedure and treatment not carried out due to patient leaving prior to being seen by health care provider: Secondary | ICD-10-CM | POA: Insufficient documentation

## 2021-12-10 DIAGNOSIS — M25562 Pain in left knee: Secondary | ICD-10-CM | POA: Diagnosis present
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
## 2021-12-12 ENCOUNTER — Encounter (HOSPITAL_COMMUNITY): Payer: Self-pay | Admitting: Emergency Medicine

## 2021-12-12 ENCOUNTER — Other Ambulatory Visit: Payer: Self-pay

## 2021-12-12 ENCOUNTER — Emergency Department (HOSPITAL_COMMUNITY): Payer: Medicaid - Out of State

## 2021-12-12 ENCOUNTER — Emergency Department (HOSPITAL_COMMUNITY)
Admission: EM | Admit: 2021-12-12 | Discharge: 2021-12-12 | Disposition: A | Discharge status: Home / Self Care | Payer: Medicaid - Out of State | Attending: Emergency Medicine | Admitting: Emergency Medicine

## 2021-12-12 DIAGNOSIS — D72829 Elevated white blood cell count, unspecified: Secondary | ICD-10-CM | POA: Diagnosis not present

## 2021-12-12 DIAGNOSIS — M25462 Effusion, left knee: Secondary | ICD-10-CM | POA: Insufficient documentation

## 2021-12-12 DIAGNOSIS — L03116 Cellulitis of left lower limb: Secondary | ICD-10-CM | POA: Diagnosis not present

## 2021-12-12 DIAGNOSIS — M25562 Pain in left knee: Secondary | ICD-10-CM | POA: Diagnosis present

## 2021-12-12 DIAGNOSIS — L0390 Cellulitis, unspecified: Secondary | ICD-10-CM | POA: Diagnosis present

## 2021-12-12 LAB — COMPREHENSIVE METABOLIC PANEL
ALT: 10 U/L (ref 0–44)
AST: 12 U/L — ABNORMAL LOW (ref 15–41)
Albumin: 3.2 g/dL — ABNORMAL LOW (ref 3.5–5.0)
Alkaline Phosphatase: 82 U/L (ref 38–126)
Anion gap: 10 (ref 5–15)
BUN: 12 mg/dL (ref 6–20)
CO2: 30 mmol/L (ref 22–32)
Calcium: 9.1 mg/dL (ref 8.9–10.3)
Chloride: 98 mmol/L (ref 98–111)
Creatinine, Ser: 0.44 mg/dL (ref 0.44–1.00)
GFR, Estimated: >60 mL/min (ref >60)
Glucose, Bld: 110 mg/dL — ABNORMAL HIGH (ref 70–99)
Potassium: 3.9 mmol/L (ref 3.5–5.1)
Sodium: 138 mmol/L (ref 135–145)
Total Bilirubin: 0.6 mg/dL (ref 0.3–1.2)
Total Protein: 7.4 g/dL (ref 6.5–8.1)

## 2021-12-12 LAB — CBC WITH DIFFERENTIAL/PLATELET
Abs Immature Granulocytes: 0.04 10*3/uL (ref 0.00–0.07)
Basophils Absolute: 0.1 10*3/uL (ref 0.0–0.1)
Basophils Relative: 0 %
Eosinophils Absolute: 0 10*3/uL (ref 0.0–0.5)
Eosinophils Relative: 0 %
HCT: 39.5 % (ref 36.0–46.0)
Hemoglobin: 13.1 g/dL (ref 12.0–15.0)
Immature Granulocytes: 0 %
Lymphocytes Relative: 14 %
Lymphs Abs: 1.6 10*3/uL (ref 0.7–4.0)
MCH: 28.3 pg (ref 26.0–34.0)
MCHC: 33.2 g/dL (ref 30.0–36.0)
MCV: 85.3 fL (ref 80.0–100.0)
Monocytes Absolute: 0.9 10*3/uL (ref 0.1–1.0)
Monocytes Relative: 8 %
Neutro Abs: 8.8 10*3/uL — ABNORMAL HIGH (ref 1.7–7.7)
Neutrophils Relative %: 78 %
Platelets: 416 10*3/uL — ABNORMAL HIGH (ref 150–400)
RBC: 4.63 MIL/uL (ref 3.87–5.11)
RDW: 13.2 % (ref 11.5–15.5)
WBC: 11.4 10*3/uL — ABNORMAL HIGH (ref 4.0–10.5)
nRBC: 0 % (ref 0.0–0.2)

## 2021-12-12 LAB — LACTIC ACID, PLASMA
Lactic Acid, Venous: 0.9 mmol/L (ref 0.5–1.9)
Lactic Acid, Venous: 0.7 mmol/L (ref 0.5–1.9)

## 2021-12-12 MED ORDER — VANCOMYCIN HCL IN DEXTROSE 1-5 GM/200ML-% IV SOLN
1000.0000 mg | Freq: Once | INTRAVENOUS | Status: AC
  Administered 2021-12-12: 1000 mg via INTRAVENOUS
  Filled 2021-12-12: qty 200

## 2021-12-12 MED ORDER — CEPHALEXIN 500 MG PO CAPS: 500.0000 mg | ORAL_CAPSULE | Freq: Four times a day (QID) | ORAL | 0 refills | Status: AC

## 2021-12-12 MED ORDER — VANCOMYCIN HCL 500 MG/100ML IV SOLN
500.0000 mg | Freq: Two times a day (BID) | INTRAVENOUS | Status: DC
  Filled 2021-12-12: qty 100

## 2021-12-12 MED ORDER — VANCOMYCIN HCL 1500 MG/300ML IV SOLN: 1500.0000 mg | Freq: Two times a day (BID) | INTRAVENOUS | Status: DC

## 2021-12-12 NOTE — Discharge Instructions (Addendum)
An antibiotic by the name of Keflex has been sent to your pharmacy.  Please take 1 tablet every 6 hours for the next 7 days.  If you start developing shortness of breath, facial swelling, or anaphylaxis, stop taking this medication immediately return to the ED.  Follow-up with your orthopedic surgeon in [place] as soon as possible.  And return to the ED for new or worsening symptoms as discussed.

## 2021-12-12 NOTE — ED Provider Notes (Incomplete)
[facility] EMERGENCY DEPARTMENT Provider Note   CSN: [id number] Arrival date & time: 12/12/21  4128     History {Add pertinent medical, surgical, social history, OB history to HPI:1} Chief Complaint  Patient presents with   Knee Pain    [name] is a 51 y.o. female with chief complaint of left knee, swelling, and pain that started a few days ago when she returned from [place].  Drove 6 to 7 hours back to [place], symptoms started the next day.  Hx of infection to the knee before per patient.  Per records cannot find history that matches this.  Denies fevers, shortness of breath, chest pain, palpitations, numbness/tingling/weakness of the left extremity.  Finds it difficult to bear weight or bend the knee.  Redness and swelling has been spreading from the knee both distally and proximally.  Denies IV drug use, recent surgery, recent illness/infection, or recent wound.  Hx of right knee surgery 2003.  Hx of left knee medial meniscectomy 2007.  The history is provided by the patient and medical records.  Knee Pain      Home Medications Prior to Admission medications   Medication Sig Start Date End Date Taking? Authorizing Provider  albuterol (PROVENTIL HFA;VENTOLIN HFA) 108 (90 BASE) MCG/ACT inhaler Inhale 2 puffs into the lungs every 6 (six) hours as needed. Shortness of Breath    [provider]  albuterol (PROVENTIL) (2.5 MG/3ML) 0.083% nebulizer solution Take 2.5 mg by nebulization every 6 (six) hours as needed for wheezing or shortness of breath.    [provider]  ALPRAZolam Prudy Feeler) 1 MG tablet Take 1 mg by mouth 3 (three) times daily as needed for anxiety.    [provider]  ARIPiprazole (ABILIFY) 2 MG tablet Take 1 tablet (2 mg total) by mouth 2 (two) times daily. For mood control 08/13/17   Money, Gerlene Burdock, FNP  bisacodyl (DULCOLAX) 5 MG EC tablet Take 5 mg by mouth daily as needed for mild constipation or moderate constipation.    [provider]  divalproex (DEPAKOTE) 500 MG DR tablet Take 1 tablet (500 mg total) by mouth every 12 (twelve) hours. Mood control 08/13/17   Money, Gerlene Burdock, FNP  EPINEPHrine (EPIPEN 2-PAK) 0.3 mg/0.3 mL DEVI Inject 0.3 mg into the muscle once. For bee stings    [provider]  FLUoxetine (PROZAC) 20 MG capsule Take 1 capsule (20 mg total) by mouth daily. For mood control 08/14/17   Money, Gerlene Burdock, FNP  gabapentin (NEURONTIN) 400 MG capsule Take 2 capsules (800 mg total) by mouth 4 (four) times daily as needed (agitation/pain). 08/13/17   Money, Gerlene Burdock, FNP  mirtazapine (REMERON) 15 MG tablet Take 1 tablet (15 mg total) by mouth at bedtime. For mood control 08/13/17   Money, Gerlene Burdock, FNP  oxyCODONE-acetaminophen (PERCOCET) 10-325 MG tablet Take 1 tablet by mouth every 4 (four) hours as needed for pain.    [provider]  polyethylene glycol powder (GLYCOLAX/MIRALAX) powder Take 17 g by mouth daily as needed for mild constipation or moderate constipation.    [provider]  propranolol (INDERAL) 10 MG tablet Take 1 tablet (10 mg total) by mouth 2 (two) times daily. 08/13/17   Money, Gerlene Burdock, FNP      Allergies    Bee venom, Haldol [haloperidol decanoate], Penicillins, Darvocet [propoxyphene n-acetaminophen], Imitrex [sumatriptan base], Ketorolac tromethamine, and Pseudoephedrine    Review of Systems   Review of Systems  Musculoskeletal:  Left knee pain and swelling    Physical Exam Updated Vital Signs BP 119/73 (BP Location: Right Arm)   Pulse 99   Temp 98.9 F (37.2 C) (Oral)   Resp 17   LMP 08/06/2017   SpO2 95%  Physical Exam Vitals and nursing note reviewed.  Constitutional:      General: She is not in acute distress.    Appearance: Normal appearance. She is well-developed. She is not ill-appearing, toxic-appearing or diaphoretic.  HENT:     Head: Normocephalic and atraumatic.     Mouth/Throat:     Mouth: Mucous membranes are moist.      Pharynx: Oropharynx is clear.  Eyes:     Conjunctiva/sclera: Conjunctivae normal.  Neck:     Comments: Neck very supple on exam Cardiovascular:     Rate and Rhythm: Normal rate and regular rhythm.     Pulses: Normal pulses.     Heart sounds: Murmur heard.  Pulmonary:     Effort: Pulmonary effort is normal. No respiratory distress.     Breath sounds: Normal breath sounds.  Abdominal:     Palpations: Abdomen is soft.     Tenderness: There is no abdominal tenderness.  Musculoskeletal:        General: Swelling and tenderness present.     Cervical back: Neck supple. No rigidity.     Right lower leg: No edema.     Left lower leg: Edema present.     Comments: Unable to bear weight on the left leg due to left knee elicited tenderness.  Restricted ROM of the left knee.  See MDM for details.  Skin:    General: Skin is warm and dry.     Capillary Refill: Capillary refill takes less than 2 seconds.     Findings: Erythema present.     Comments: Swelling, erythema, warmth, tenderness of the left knee.  Appears to have distal and proximal spreading/involvement.  Neurological:     Mental Status: She is alert and oriented to person, place, and time.  Psychiatric:        Mood and Affect: Mood normal.     ED Results / Procedures / Treatments   Labs (all labs ordered are listed, but only abnormal results are displayed) Labs Reviewed - No data to display  EKG None  Radiology DG Knee Complete 4 Views Left  Result Date: 12/12/2021 CLINICAL DATA:  Left knee pain, redness and swelling for 1 week. No injury. Knee surgery 2 years ago. EXAM: LEFT KNEE - COMPLETE 4+ VIEW COMPARISON:  None Available. FINDINGS: No fracture or bone lesion. No areas of bone resorption. Knee prosthetic components appear well seated and aligned. No evidence of loosening. Small joint effusion. There is significant soft tissue edema left predominates anteriorly. IMPRESSION: 1. No fracture, bone lesion, evidence of  osteomyelitis or evidence of prosthesis loosening. 2. Small joint effusion. 3. Significant soft tissue swelling most evident anteriorly, nonspecific. Electronically Signed   By: Amie Portland M.D.   On: 12/12/2021 11:06    Procedures Procedures  {Document cardiac monitor, telemetry assessment procedure when appropriate:1}  Medications Ordered in ED Medications - No data to display  ED Course/ Medical Decision Making/ A&P                           Medical Decision Making Amount and/or Complexity of Data Reviewed External Data Reviewed: notes. Labs: ordered. Decision-making details documented in ED Course. Radiology: ordered and independent interpretation  performed. Decision-making details documented in ED Course. ECG/medicine tests: ordered and independent interpretation performed. Decision-making details documented in ED Course.  Risk OTC drugs. Prescription drug management. Decision regarding hospitalization.   51 y.o. female presents to the ED for concern of Knee Pain     This involves an extensive number of treatment options, and is a complaint that carries with it a high risk of complications and morbidity.  The emergent differential diagnosis prior to evaluation includes, but is not limited to: ***  This is not an exhaustive differential.   Past Medical History / Co-morbidities / Social History: *** Social Determinants of Health include ***  Additional History:  Internal and external records from outside source obtained and reviewed including ***  Physical Exam: Physical exam performed. The pertinent findings include: ***       Lab Tests: I ordered, and personally interpreted labs.  The pertinent results include:   CBC: Mild elevated WBC 11.4 with left shift, otherwise unremarkable CMP/BMP: Overall unremarkable, sodium 138 Lactic acid: 0.9 UA: Pending Blood cultures: Pending  Imaging Studies: I ordered imaging studies including XR left knee .  I  independently visualized and interpreted said imaging.  Pertinent results include: 1. No fracture, bone lesion, evidence of osteomyelitis or evidence of prosthesis loosening.  2. Small joint effusion.  3. Significant soft tissue swelling most evident anteriorly, nonspecific I agree with the radiologist interpretation.  ED Course: Pt well-appearing on exam.  ***  Upon reevaluation, ***  I consulted with Dr. Theodis Blaze of Orthopedics, and discussed lab and imaging findings as well as pertinent plan - they recommend: Antibiotic treatment and following up with the orthopedic group who performed the surgery in [place].    After consideration of the diagnostic results, orthopedic consultation, and the patient's encounter today, although the patient is hemodynamically stable, I am concerned about the state of infection of her left knee.  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  Discussed course of treatment thoroughly with the patient, whom demonstrated understanding.  Patient in agreement and has no further questions.  I have reviewed the patients home medicines and have made adjustments as needed.  Consultation placed for internal medicine.  I consulted with Dr. Clearence Ped of Internal Medicine, and discussed lab and imaging findings as well as pertinent plan.   Internal medicine is willing to accept the patient for IV antibiotics and close monitoring.  Disposition:   This patient was a shared case encounter with my attending, Dr. Ashok Cordia, who directly contributed to the proposed treatment course and cosigned this note including patient's presenting symptoms, physical exam, and planned diagnostics and interventions.  Attending physician stated agreement with plan or made changes to plan which were  implemented.     This chart was dictated using voice recognition software.  Despite best efforts to proofread, errors can occur which can change the documentation meaning.   {Document critical care time when appropriate:1} {Document review of labs and clinical decision tools ie heart score, Chads2Vasc2 etc:1}  {Document your independent review of radiology images, and any outside records:1} {Document your discussion with family members, caretakers, and with consultants:1} {Document social determinants of health affecting pt's care:1} {Document your decision making why or why not admission, treatments were needed:1} Final Clinical Impression(s) / ED Diagnoses Final diagnoses:  None    Rx / DC Orders  ED Discharge Orders     None

## 2021-12-12 NOTE — Progress Notes (Signed)
Admission was requested by [facility] Loralyn Freshwater. I spoke to the patient about risks/benefits of admission vs antibiotics outpatient. Patient would prefer to be discharged home so that she can follow up with her orthopedist. She understands return precautions. She verbalized understanding of the importance of following up with her orthopedist in a timely manner, and the importance of compliance with antibiotic therapy. She is ready to be discharged home which I have discussed with Dr. Denton Lank (attending) and Loralyn Freshwater (assigned provider).

## 2021-12-12 NOTE — ED Notes (Signed)
EDP informed nurse, hospitalist agreed to discharged patient home with oral antibiotics. Patient verbalized understanding of treatment plan.

## 2021-12-12 NOTE — ED Triage Notes (Signed)
Pt c/o left knee swelling and redness with pain x 1 week. Knee surgery x 2 years ago. States this happened after surgery and was a "bad infection" and denies clot. Moderate swelling and redness noted to left knee with warmth to touch. Pt states did have a 6 hour drive last week.

## 2021-12-12 NOTE — Progress Notes (Signed)
Pharmacy Antibiotic Note  [name] is a 51 y.o. female admitted on 12/12/2021 with cellulitis.  Pharmacy has been consulted for vancomycin dosing.  Plan: Vancomycin 1000mg  IV loading dose then 500mg  IV Q 12 hrs. Goal AUC 400-550. Expected AUC: 424 SCr used: 0.8(actual is 0.44)  F/U cxs and clinical progress Monitor V/S, labs and levels as indicated   Temp (24hrs), Avg:98.9 F (37.2 C), Min:98.9 F (37.2 C), Max:98.9 F (37.2 C)  Recent Labs  Lab 12/12/21 1415 12/12/21 1419  WBC 11.4*  --   CREATININE 0.44  --   LATICACIDVEN  --  0.9    Estimated Creatinine Clearance: 67.3 mL/min (by C-G formula based on SCr of 0.44 mg/dL).    Allergies  Allergen Reactions   Bee Venom Anaphylaxis   Haldol [Haloperidol Decanoate] Other (See Comments)    Cardiac Arrest    Penicillins Anaphylaxis    Has patient had a PCN reaction causing immediate rash, facial/tongue/throat swelling, SOB or lightheadedness with hypotension: Yes Has patient had a PCN reaction causing severe rash involving mucus membranes or skin necrosis: Yes Has patient had a PCN reaction that required hospitalization: Yes Has patient had a PCN reaction occurring within the last 10 years: No If all of the above answers are "NO", then may proceed with Cephalosporin use.    Darvocet [Propoxyphene N-Acetaminophen] Other (See Comments)    Gi upset   Imitrex [Sumatriptan Base] Swelling   Ketorolac Tromethamine Other (See Comments)    Unknown per pt    Pseudoephedrine Anxiety    Antimicrobials this admission: Vancomycin 6/19 >>   Microbiology results: 6/19 BCx: pending  Thank you for allowing pharmacy to be a part of this patient's care.  7/19, BS Pharm D, BCPS Clinical Pharmacist 12/12/2021 3:46 PM

## 2021-12-17 LAB — CULTURE, BLOOD (ROUTINE X 2)
Culture: NO GROWTH
Culture: NO GROWTH
Special Requests: ADEQUATE
